# Patient Record
Sex: Female | Born: 1950 | Race: White | Hispanic: No | Marital: Married | Smoking: Former smoker
Health system: Southern US, Community
[De-identification: ages and names within clinical notes are randomized; demographics above are authoritative.]

## PROBLEM LIST (undated history)

## (undated) DIAGNOSIS — D693 Immune thrombocytopenic purpura: Secondary | ICD-10-CM

## (undated) DIAGNOSIS — D126 Benign neoplasm of colon, unspecified: Secondary | ICD-10-CM

## (undated) DIAGNOSIS — I471 Supraventricular tachycardia: Secondary | ICD-10-CM

## (undated) DIAGNOSIS — I10 Essential (primary) hypertension: Secondary | ICD-10-CM

## (undated) DIAGNOSIS — K759 Inflammatory liver disease, unspecified: Secondary | ICD-10-CM

## (undated) DIAGNOSIS — R002 Palpitations: Secondary | ICD-10-CM

## (undated) DIAGNOSIS — Z973 Presence of spectacles and contact lenses: Secondary | ICD-10-CM

## (undated) DIAGNOSIS — M1990 Unspecified osteoarthritis, unspecified site: Secondary | ICD-10-CM

## (undated) DIAGNOSIS — K219 Gastro-esophageal reflux disease without esophagitis: Secondary | ICD-10-CM

## (undated) DIAGNOSIS — K766 Portal hypertension: Secondary | ICD-10-CM

## (undated) DIAGNOSIS — K7460 Unspecified cirrhosis of liver: Secondary | ICD-10-CM

## (undated) DIAGNOSIS — I499 Cardiac arrhythmia, unspecified: Secondary | ICD-10-CM

## (undated) DIAGNOSIS — M545 Low back pain, unspecified: Secondary | ICD-10-CM

## (undated) DIAGNOSIS — Z889 Allergy status to unspecified drugs, medicaments and biological substances status: Secondary | ICD-10-CM

## (undated) DIAGNOSIS — K2270 Barrett's esophagus without dysplasia: Secondary | ICD-10-CM

## (undated) DIAGNOSIS — M19041 Primary osteoarthritis, right hand: Secondary | ICD-10-CM

## (undated) DIAGNOSIS — I480 Paroxysmal atrial fibrillation: Secondary | ICD-10-CM

## (undated) DIAGNOSIS — R7303 Prediabetes: Secondary | ICD-10-CM

## (undated) DIAGNOSIS — M26609 Unspecified temporomandibular joint disorder, unspecified side: Secondary | ICD-10-CM

## (undated) DIAGNOSIS — J189 Pneumonia, unspecified organism: Secondary | ICD-10-CM

## (undated) DIAGNOSIS — K5790 Diverticulosis of intestine, part unspecified, without perforation or abscess without bleeding: Secondary | ICD-10-CM

## (undated) DIAGNOSIS — R609 Edema, unspecified: Secondary | ICD-10-CM

## (undated) DIAGNOSIS — R600 Localized edema: Secondary | ICD-10-CM

## (undated) DIAGNOSIS — H903 Sensorineural hearing loss, bilateral: Secondary | ICD-10-CM

## (undated) DIAGNOSIS — M19042 Primary osteoarthritis, left hand: Secondary | ICD-10-CM

## (undated) DIAGNOSIS — I700 Atherosclerosis of aorta: Secondary | ICD-10-CM

## (undated) DIAGNOSIS — J45909 Unspecified asthma, uncomplicated: Secondary | ICD-10-CM

## (undated) DIAGNOSIS — I4719 Other supraventricular tachycardia: Secondary | ICD-10-CM

## (undated) DIAGNOSIS — Z8619 Personal history of other infectious and parasitic diseases: Secondary | ICD-10-CM

## (undated) HISTORY — PX: TMJ ARTHROPLASTY: SHX1066

## (undated) HISTORY — DX: Supraventricular tachycardia: I47.1

## (undated) HISTORY — DX: Other disorders of iron metabolism: E83.19

## (undated) HISTORY — PX: REFRACTIVE SURGERY: SHX103

## (undated) HISTORY — DX: Benign neoplasm of colon, unspecified: D12.6

## (undated) HISTORY — DX: Other supraventricular tachycardia: I47.19

## (undated) HISTORY — DX: Unspecified cirrhosis of liver: K74.60

## (undated) HISTORY — PX: TONSILLECTOMY AND ADENOIDECTOMY: SUR1326

## (undated) HISTORY — DX: Low back pain: M54.5

## (undated) HISTORY — PX: EYE SURGERY: SHX253

## (undated) HISTORY — DX: Low back pain, unspecified: M54.50

---

## 1977-12-08 HISTORY — PX: ABDOMINAL HYSTERECTOMY: SHX81

## 1995-12-09 HISTORY — PX: CARPAL TUNNEL RELEASE: SHX101

## 2000-10-08 ENCOUNTER — Encounter: Payer: Self-pay | Admitting: General Surgery

## 2000-10-08 ENCOUNTER — Encounter: Admission: RE | Admit: 2000-10-08 | Discharge: 2000-10-08 | Payer: Self-pay | Admitting: General Surgery

## 2000-10-08 ENCOUNTER — Other Ambulatory Visit: Admission: RE | Admit: 2000-10-08 | Discharge: 2000-10-08 | Payer: Self-pay | Admitting: General Surgery

## 2001-10-12 ENCOUNTER — Encounter: Payer: Self-pay | Admitting: General Surgery

## 2001-10-12 ENCOUNTER — Encounter: Admission: RE | Admit: 2001-10-12 | Discharge: 2001-10-12 | Payer: Self-pay | Admitting: General Surgery

## 2002-06-17 ENCOUNTER — Encounter: Admission: RE | Admit: 2002-06-17 | Discharge: 2002-06-17 | Payer: Self-pay | Admitting: General Surgery

## 2002-06-17 ENCOUNTER — Encounter: Payer: Self-pay | Admitting: General Surgery

## 2003-06-14 ENCOUNTER — Encounter: Admission: RE | Admit: 2003-06-14 | Discharge: 2003-06-14 | Payer: Self-pay | Admitting: Internal Medicine

## 2003-06-14 ENCOUNTER — Encounter: Payer: Self-pay | Admitting: Internal Medicine

## 2005-04-09 ENCOUNTER — Emergency Department: Payer: Self-pay | Admitting: Emergency Medicine

## 2005-04-29 ENCOUNTER — Ambulatory Visit: Payer: Self-pay | Admitting: Unknown Physician Specialty

## 2005-10-14 ENCOUNTER — Ambulatory Visit: Payer: Self-pay | Admitting: Internal Medicine

## 2006-12-17 ENCOUNTER — Ambulatory Visit: Payer: Self-pay | Admitting: Internal Medicine

## 2007-10-01 ENCOUNTER — Ambulatory Visit: Payer: Self-pay | Admitting: Gastroenterology

## 2007-12-08 ENCOUNTER — Ambulatory Visit: Payer: Self-pay | Admitting: Gastroenterology

## 2007-12-23 ENCOUNTER — Ambulatory Visit: Payer: Self-pay | Admitting: Gastroenterology

## 2008-01-07 ENCOUNTER — Ambulatory Visit: Payer: Self-pay | Admitting: Gastroenterology

## 2009-01-04 ENCOUNTER — Ambulatory Visit: Payer: Self-pay | Admitting: Internal Medicine

## 2010-01-29 ENCOUNTER — Ambulatory Visit: Payer: Self-pay | Admitting: Internal Medicine

## 2010-05-17 ENCOUNTER — Ambulatory Visit: Payer: Self-pay | Admitting: Podiatry

## 2010-08-14 ENCOUNTER — Ambulatory Visit: Payer: Self-pay | Admitting: Podiatry

## 2010-12-18 ENCOUNTER — Ambulatory Visit: Payer: Self-pay | Admitting: Internal Medicine

## 2011-08-12 ENCOUNTER — Ambulatory Visit: Payer: Self-pay

## 2012-02-05 ENCOUNTER — Ambulatory Visit: Payer: Self-pay | Admitting: Family Medicine

## 2012-04-12 ENCOUNTER — Ambulatory Visit: Payer: Self-pay | Admitting: Gastroenterology

## 2012-04-14 LAB — PATHOLOGY REPORT

## 2013-04-27 ENCOUNTER — Ambulatory Visit: Payer: Self-pay | Admitting: Family Medicine

## 2013-08-29 ENCOUNTER — Ambulatory Visit: Payer: Self-pay | Admitting: Orthopedic Surgery

## 2014-01-20 ENCOUNTER — Ambulatory Visit (INDEPENDENT_AMBULATORY_CARE_PROVIDER_SITE_OTHER): Payer: 59 | Admitting: Neurology

## 2014-01-20 ENCOUNTER — Encounter: Payer: Self-pay | Admitting: Neurology

## 2014-01-20 VITALS — BP 146/78 | HR 78 | Ht 66.0 in | Wt 178.0 lb

## 2014-01-20 DIAGNOSIS — M79609 Pain in unspecified limb: Secondary | ICD-10-CM

## 2014-01-20 DIAGNOSIS — R202 Paresthesia of skin: Secondary | ICD-10-CM

## 2014-01-20 DIAGNOSIS — M79602 Pain in left arm: Secondary | ICD-10-CM | POA: Insufficient documentation

## 2014-01-20 DIAGNOSIS — R209 Unspecified disturbances of skin sensation: Secondary | ICD-10-CM

## 2014-01-20 MED ORDER — GABAPENTIN 300 MG PO CAPS: 300.0000 mg | ORAL_CAPSULE | Freq: Every day | ORAL | Status: DC

## 2014-01-20 MED ORDER — DIAZEPAM 5 MG PO TABS: 5.0000 mg | ORAL_TABLET | ORAL | Status: DC | PRN

## 2014-01-20 NOTE — Progress Notes (Signed)
Note faxed.

## 2014-01-20 NOTE — Patient Instructions (Addendum)
1.  MRI left brachial plexus with contrast 2.  EMG of the left upper extremity on March 4th 3.  Start neurontin 300mg  at bedtime, if tolerating, start taking twice daily. 4.  Return to clinic 4-weeks  Your MRI has been scheduled at [facility] on February 19 at 5:30 pm.  Their office is located at [address].  Please go downstairs to [facility] to have labs drawn before leaving today.

## 2014-01-20 NOTE — Progress Notes (Signed)
[facility] Clinic Note - Initial Visit   Date: 01/20/2014    Traci Lynn MRN: [id number] DOB: 1951/10/14   Dear Dr Melrose Nakayama:  Thank you for your kind referral of Traci Lynn for consultation of left arm pain. Although her history is well known to you, please allow [place] to reiterate it for the purpose of our medical record. The patient was accompanied to the clinic by self.     History of Present Illness: Traci Lynn is a 63 y.o. right-handed Caucasian female with history of asthma, paroxsymal atrial tachycardia presenting for evaluation of left arm pain, numbness, and tingling.    Around the summer 2014, she gradually developed worsening left shoulder/arm pain and numbness/cold sensation over the left medial forearm.  Pain is over the left shoulder and posterior arm, described as stabbing.  Pain is intermittent and worse with shoulder flexion, external rotation, and abduction.  She is unable to lay on her left side due to pain.  There is no pain with movement elbow flexion/extension, wrist or hand movements.  She also has numbness over the left medial forearm and last two fingers. Denies any alleviating factors.  There is mild weakness, but she attributes this to pain.  She saw her PCP initially who tried pain medication and ant-inflammatory medication.  She was then sent to Dr. Joie Bimler, orthopeadics, who ordered MRI of cervical spine spine which showed mild to moderate foraminal narrowing at C5-6 and C-7 with no significant nerve encroachment.  In October, she saw Dr. Melrose Nakayama and underwent EMG which was non-diagnostic.  She was referred today for further evaluation.  Out-side paper records, electronic medical record, and images have been reviewed where available and summarized as:  MRI cervical spine 08/29/2013: Mild to moderate neuroforaminal narrowing on the right C3-4 level. Exiting nerve root compromise is a diagnostic consideration. The left neural  foramen is patent. At the C5-6 level, broad-based disc bulges appreciated demonstrating lateralization to the right and left. There is mild to moderate narrowing on the left and mild on the right. At the C6-7 level, there is mild neuroforaminal narrowing on the left which appears to be secondary to lateralization of the disc bulge.  Labs 04/11/2013 hemoglobin A1c 5.7, CBC within normal limits,  EMG of the left upper extremity performed at [facility] dated 10/05/2013:  Normal study.  Past Medical History  Diagnosis Date  . Paroxysmal atrial tachycardia   . Tubular adenoma of colon   . Lumbago     Past Surgical History  Procedure Laterality Date  . Carpal tunnel release    . Abdominal hysterectomy    . Tmj arthroplasty    . Tonsillectomy and adenoidectomy       Medications:  Current Outpatient Prescriptions on File Prior to Visit  Medication Sig Dispense Refill  . albuterol (PROVENTIL HFA;VENTOLIN HFA) 108 (90 BASE) MCG/ACT inhaler Inhale 1 puff into the lungs every 4 (four) hours as needed for wheezing or shortness of breath.      . Calcium Carbonate-Vitamin D (CALTRATE 600+D) 600-400 MG-UNIT per tablet Take 2 tablets by mouth daily.      . Cholecalciferol (VITAMIN D3) 2000 UNITS TABS Take 2 tablets by mouth daily.      Marland Kitchen EPINEPHrine (EPI-PEN) 0.3 mg/0.3 mL SOAJ injection Inject into the muscle as directed.      . fluticasone (FLONASE) 50 MCG/ACT nasal spray Place 2 sprays into both nostrils daily.      . Multiple Vitamins-Minerals (CENTRUM SILVER PO) Take 1 tablet  by mouth daily.      . vitamin C (ASCORBIC ACID) 250 MG tablet Take 250 mg by mouth daily.      . vitamin E 400 UNIT capsule Take 400 Units by mouth daily.       No current facility-administered medications on file prior to visit.    Allergies:  Allergies  Allergen Reactions  . Penicillin V Potassium Rash  . Shellfish Allergy     Family History: Family History  Problem Relation Age of Onset  . Kidney  disease Father     Deceased, 72  . Colon cancer Mother     Deceased 15  . Healthy Son     Social History: History   Social History  . Marital Status: Married    Spouse Name: N/A    Number of Children: N/A  . Years of Education: N/A   Occupational History  . Solicitor    Social History Main Topics  . Smoking status: Former Research scientist (life sciences)  . Smokeless tobacco: Never Used  . Alcohol Use: No  . Drug Use: No  . Sexual Activity: Not on file   Other Topics Concern  . Not on file   Social History Narrative   Lives with husband, married 7 years.  She has one son.   Retired from [employer].    Highest level of education:  4 years of college    Review of Systems:  CONSTITUTIONAL: No fevers, chills, night sweats, or weight loss.   EYES: No visual changes or eye pain ENT: No hearing changes.  No history of nose bleeds.   RESPIRATORY: No cough, wheezing and shortness of breath.   CARDIOVASCULAR: Negative for chest pain, and palpitations.   GI: Negative for abdominal discomfort, blood in stools or black stools.  No recent change in bowel habits.   GU:  No history of incontinence.   MUSCLOSKELETAL: + joint pain or swelling.  No myalgias.   SKIN: Negative for lesions, rash, and itching.   HEMATOLOGY/ONCOLOGY: Negative for prolonged bleeding, bruising easily, and swollen nodes.  No history of cancer.   ENDOCRINE: Negative for cold or heat intolerance, polydipsia or goiter.   PSYCH:  No depression or anxiety symptoms.   NEURO: As Above.   Vital Signs:  BP 146/78  Pulse 78  Ht 5\' 6"  (1.676 m)  Wt 178 lb (80.74 kg)  BMI 28.74 kg/m2  Neurological Exam: MENTAL STATUS including orientation to time, place, person, recent and remote memory, attention span and concentration, language, and fund of knowledge is normal.  Speech is not dysarthric.  CRANIAL NERVES: II:  No visual field defects.  Unremarkable fundi.   III-IV-VI: Pupils equal round and reactive to light.  Normal  conjugate, extra-ocular eye movements in all directions of gaze.  No nystagmus.  No ptosis.   V:  Normal facial sensation.   VII:  Normal facial symmetry and movements.    VIII:  Normal hearing and vestibular function.   IX-X:  Normal palatal movement.   XI:  Normal shoulder shrug and head rotation.   XII:  Normal tongue strength and range of motion, no deviation or fasciculation.  MOTOR:  Examination of the proximal left upper extremity is pain-limited, especially with deltoid, medial pectoralis, and infraspinatus testing.  No atrophy, fasciculations or abnormal movements.  No pronator drift.  Tone is normal.    Right Upper Extremity:    Left Upper Extremity:    Deltoid  5/5   Deltoid  5-/5   Biceps  5/5   Biceps  5/5   Triceps  5/5   Triceps  4+/5   Wrist extensors  5/5   Wrist extensors  5-/5   Wrist flexors  5/5   Wrist flexors  5-/5   Finger extensors  5/5   Finger extensors  5/5   Finger flexors 2/3 5/5   Finger flexors 2/3 5/5   Finger flexors 4/5 5/5  Finger flexors 4/5 4/5  ADM 5/5  ADM 4/5  Dorsal interossei  5/5   Dorsal interossei  4+/5   Abductor pollicis  5-/5   Abductor pollicis  5-/5   Tone (Ashworth scale)  0  Tone (Ashworth scale)  0   Right Lower Extremity:    Left Lower Extremity:    Hip flexors  5/5   Hip flexors  5/5   Hip extensors  5/5   Hip extensors  5/5   Knee flexors  5/5   Knee flexors  5/5   Knee extensors  5/5   Knee extensors  5/5   Dorsiflexors  5/5   Dorsiflexors  5/5   Plantarflexors  5/5   Plantarflexors  5/5   Toe extensors  5/5   Toe extensors  5/5   Toe flexors  5/5   Toe flexors  5/5   Tone (Ashworth scale)  0  Tone (Ashworth scale)  0   MSRs:  Right                                                                 Left brachioradialis 2+  brachioradialis 2+  biceps 2+  biceps 2+  triceps 2+  triceps 1+  patellar 2+  patellar 2+  ankle jerk 2+  ankle jerk 2+  Hoffman no  Hoffman no  plantar response down  plantar response down    SENSORY:  Reduced pin prick over the left lateral upper arm, medial forearm, and 5th digit.  Otherwise, normal and symmetric perception of light touch, pinprick, vibration, and proprioception.  Romberg's sign absent.   COORDINATION/GAIT: Normal finger-to- nose-finger and heel-to-shin.  Intact rapid alternating movements bilaterally.  Able to rise from a chair without using arms.  Gait narrow based and stable. Tandem and stressed gait intact.    IMPRESSION: Mrs. Angevine is a delightful 63 year-old female presenting for evaluation of left arm pain, paresthesias, and hand weakness. Neurological examination demonstrates weakness following C8 myotomes with sensory changes of the ulnar and median antebrachial cutaneous nerve distribution.  Additionally, her left triceps reflex is depressed compared to other reflexes.  Based on her history and exam, I would like to evaluate for a brachial plexus lesion and will repeat her NCS/EMG and obtain MRI of the brachial plexus.  Her history is not classic for neuralgic amyotrophy, but something to consider.   PLAN/RECOMMENDATIONS:  1.  MRI brachial plexus 2.  EMG of the left upper extremity 3.  Start neurontin 300mg  at bedtime, if tolerating, start taking twice daily.  Risks and benefits discussed. 4.  Return to clinic in 4 weeks  The duration of this appointment visit was 50 minutes of face-to-face time with the patient.  Greater than 50% of this time was spent in counseling, explanation of diagnosis, planning of further management, and coordination of care.   Thank  you for allowing me to participate in patient's care.  If I can answer any additional questions, I would be pleased to do so.    Sincerely,    Donika K. Posey Pronto, DO

## 2014-01-25 LAB — CREATINE: Creatinine, Ser: 0.4 mg/dL (ref <1.3)

## 2014-01-26 ENCOUNTER — Ambulatory Visit
Admission: RE | Admit: 2014-01-26 | Discharge: 2014-01-26 | Disposition: A | Discharge status: Home / Self Care | Payer: 59 | Source: Ambulatory Visit | Attending: Neurology | Admitting: Neurology

## 2014-01-26 DIAGNOSIS — R202 Paresthesia of skin: Secondary | ICD-10-CM

## 2014-01-26 DIAGNOSIS — M79602 Pain in left arm: Secondary | ICD-10-CM

## 2014-01-26 MED ORDER — GADOBENATE DIMEGLUMINE 529 MG/ML IV SOLN
15.0000 mL | Freq: Once | INTRAVENOUS | Status: AC | PRN
  Administered 2014-01-26: 15 mL via INTRAVENOUS

## 2014-01-30 ENCOUNTER — Encounter: Payer: Self-pay | Admitting: *Deleted

## 2014-02-08 ENCOUNTER — Ambulatory Visit (INDEPENDENT_AMBULATORY_CARE_PROVIDER_SITE_OTHER): Payer: 59 | Admitting: Neurology

## 2014-02-08 ENCOUNTER — Encounter: Payer: 59 | Admitting: Neurology

## 2014-02-08 ENCOUNTER — Encounter: Payer: Self-pay | Admitting: Neurology

## 2014-02-08 DIAGNOSIS — M5412 Radiculopathy, cervical region: Secondary | ICD-10-CM

## 2014-02-08 NOTE — Procedures (Signed)
[facility]  [address] [place]  [place], [place] [address] Tel: [phone] Fax:  [phone] Test Date:  02/08/2014  Patient: Traci Lynn DOB: 1951-02-25 Physician: Narda Amber, DO  Sex: Female Height: 5\' 6"  Ref Phys: Narda Amber  ID#: [id number] Temp: 35.2 Technician:    Patient Complaints: This is a 63 year-old female presenting with left arm pain, numbness, and tingling.  NCV & EMG Findings: Extensive electrodiagnostic evaluation of the left upper extremity reveals:  1. Normal median, ulnar, radial, lateral antebrachial cutaneous, and medial antebrachial cutaneous sensory nerve responses. 2. Normal median and ulnar motor responses. 3. Needle electrode examination shows very mild chronic motor axon loss changes affecting the first dorsal interosseous and triceps muscles only.  Impression: 1. 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 2. There is no evidence of a left brachial plexopathy.   ___________________________ Narda Amber, DO    Nerve Conduction Studies Anti Sensory Summary Table   Site NR Peak (ms) Norm Peak (ms) P-T Amp (V) Norm P-T Amp  Left Lat Ante Brach Cutan Anti Sensory (Lat Forearm)  Lat Biceps    2.4 <2.8 16.2 >10  Left Med Ante Brach Cutan Anti Sensory (Med Forearm)  Elbow    2.9  10.2   Left Median Anti Sensory (2nd Digit)  Wrist    2.8 <3.8 34.8 >10  Left Radial Anti Sensory (Base 1st Digit)  Wrist    2.1 <2.8 38.4 >10  Left Ulnar Anti Sensory (5th Digit)  Wrist    2.6 <3.2 34.0 >5   Motor Summary Table   Site NR Onset (ms) Norm Onset (ms) O-P Amp (mV) Norm O-P Amp Site1 Site2 Delta-0 (ms) Dist (cm) Vel (m/s) Norm Vel (m/s)  Left Median Motor (Abd Poll Brev)  Wrist    2.6 <4.0 9.7 >5 Elbow Wrist 4.5 27.0 60 >50  Elbow    7.1  9.6         Left Ulnar Motor (Abd Dig Minimi)  Wrist    2.0 <3.1 8.8 >7 B Elbow Wrist 3.7 23.0 62 >50  B Elbow    5.7  8.5  A  Elbow B Elbow 2.0 10.0 50 >50  A Elbow    7.7  8.5          EMG   Side Muscle Ins Act Fibs Psw Fasc Number Recrt Dur Dur. Amp Amp. Poly Poly. Comment  Left 1stDorInt Nml Nml Nml Nml 1- Mod-R Few 1+ Few 1+ Nml Nml N/A  Left FlexPolLong Nml Nml Nml Nml Nml Nml Nml Nml Nml Nml Nml Nml N/A  Left Ext Indicis Nml Nml Nml Nml Nml Nml Nml Nml Nml Nml Nml Nml N/A  Left PronatorTeres Nml Nml Nml Nml Nml Nml Nml Nml Nml Nml Nml Nml N/A  Left ABD Dig Min Nml Nml Nml Nml Nml Nml Nml Nml Nml Nml Nml Nml N/A  Left Biceps Nml Nml Nml Nml Nml Nml Nml Nml Nml Nml Nml Nml N/A  Left Triceps Nml Nml Nml Nml 1- Mod-R Few 1+ Nml Nml Nml Nml N/A  Left Deltoid Nml Nml Nml Nml Nml Nml Nml Nml Nml Nml Nml Nml N/A  Left Infraspinatus Nml Nml Nml Nml Nml Nml Nml Nml Nml Nml Nml Nml N/A  Left Supraspinatus Nml Nml Nml Nml Nml Nml Nml Nml Nml Nml Nml Nml N/A  Left Cervical Parasp Low Nml Nml Nml Nml NE - - - - - - -  N/A      Waveforms:

## 2014-02-08 NOTE — Progress Notes (Signed)
See procedure note for EMG results.  Donika K. Patel, DO  

## 2014-02-09 ENCOUNTER — Encounter: Payer: Self-pay | Admitting: *Deleted

## 2014-02-09 ENCOUNTER — Other Ambulatory Visit: Payer: Self-pay | Admitting: *Deleted

## 2014-02-09 DIAGNOSIS — M79602 Pain in left arm: Secondary | ICD-10-CM

## 2014-02-09 NOTE — Progress Notes (Signed)
Notes faxed.  Pt notified of appt with Dr. Joie Bimler on March 9 at 2:00.   She wants to know if she needs to keep her appt with you on March 13.

## 2014-02-13 ENCOUNTER — Telehealth: Payer: Self-pay | Admitting: Neurology

## 2014-02-13 NOTE — Telephone Encounter (Signed)
Pt would like rx for 45 of the gabapentin [facility]

## 2014-02-14 ENCOUNTER — Other Ambulatory Visit: Payer: Self-pay | Admitting: *Deleted

## 2014-02-14 NOTE — Telephone Encounter (Signed)
Refill for 6 months.  Thanks.

## 2014-02-14 NOTE — Telephone Encounter (Signed)
LM informing pt that I called in rx to [facility] in [place].

## 2014-02-14 NOTE — Telephone Encounter (Signed)
How many refills? 

## 2014-02-15 ENCOUNTER — Telehealth: Payer: Self-pay | Admitting: Neurology

## 2014-02-15 NOTE — Telephone Encounter (Signed)
Pt called to cancel follow up scheduled for 02/17/14. She says she was referred to Ortho for follow up / [name]

## 2014-02-16 NOTE — Telephone Encounter (Signed)
Noted.  Traci Lynn K. Tyrihanna Wingert, DO   

## 2014-02-17 ENCOUNTER — Ambulatory Visit: Payer: 59 | Admitting: Neurology

## 2015-01-05 ENCOUNTER — Other Ambulatory Visit: Payer: Self-pay | Admitting: Neurology

## 2015-01-05 NOTE — Telephone Encounter (Signed)
Rx sent 

## 2015-01-16 ENCOUNTER — Other Ambulatory Visit: Payer: Self-pay | Admitting: *Deleted

## 2015-01-16 NOTE — Telephone Encounter (Signed)
Patient requesting a refill on this med. Patient has not been seen in a year

## 2015-01-17 ENCOUNTER — Other Ambulatory Visit: Payer: Self-pay | Admitting: *Deleted

## 2015-01-17 MED ORDER — GABAPENTIN 300 MG PO CAPS: 300.0000 mg | ORAL_CAPSULE | Freq: Every day | ORAL | Status: DC

## 2015-01-17 NOTE — Telephone Encounter (Signed)
Rx sent 

## 2015-03-09 ENCOUNTER — Ambulatory Visit
Admit: 2015-03-09 | Disposition: A | Discharge status: Home / Self Care | Payer: Self-pay | Attending: Family Medicine | Admitting: Family Medicine

## 2015-08-20 ENCOUNTER — Ambulatory Visit
Admission: EM | Admit: 2015-08-20 | Discharge: 2015-08-20 | Disposition: A | Discharge status: Home / Self Care | Payer: 59 | Attending: Family Medicine | Admitting: Family Medicine

## 2015-08-20 ENCOUNTER — Other Ambulatory Visit: Payer: Self-pay

## 2015-08-20 ENCOUNTER — Encounter: Payer: Self-pay | Admitting: Emergency Medicine

## 2015-08-20 DIAGNOSIS — R1013 Epigastric pain: Secondary | ICD-10-CM | POA: Insufficient documentation

## 2015-08-20 DIAGNOSIS — K529 Noninfective gastroenteritis and colitis, unspecified: Secondary | ICD-10-CM | POA: Diagnosis not present

## 2015-08-20 DIAGNOSIS — R079 Chest pain, unspecified: Secondary | ICD-10-CM | POA: Diagnosis present

## 2015-08-20 DIAGNOSIS — R109 Unspecified abdominal pain: Secondary | ICD-10-CM | POA: Diagnosis present

## 2015-08-20 LAB — CBC WITH DIFFERENTIAL/PLATELET
BASOS ABS: 0.1 10*3/uL (ref 0–0.1)
BASOS PCT: 1 %
EOS ABS: 0.3 10*3/uL (ref 0–0.7)
EOS PCT: 4 %
HCT: 36.9 % (ref 35.0–47.0)
Hemoglobin: 12.7 g/dL (ref 12.0–16.0)
Lymphocytes Relative: 38 %
Lymphs Abs: 2.5 10*3/uL (ref 1.0–3.6)
MCH: 31.6 pg (ref 26.0–34.0)
MCHC: 34.4 g/dL (ref 32.0–36.0)
MCV: 91.8 fL (ref 80.0–100.0)
MONO ABS: 0.5 10*3/uL (ref 0.2–0.9)
MONOS PCT: 8 %
NEUTROS ABS: 3.2 10*3/uL (ref 1.4–6.5)
Neutrophils Relative %: 49 %
PLATELETS: 144 10*3/uL — AB (ref 150–440)
RBC: 4.02 MIL/uL (ref 3.80–5.20)
RDW: 14.4 % (ref 11.5–14.5)
WBC: 6.6 10*3/uL (ref 3.6–11.0)

## 2015-08-20 LAB — COMPREHENSIVE METABOLIC PANEL
ALBUMIN: 4.2 g/dL (ref 3.5–5.0)
ALK PHOS: 122 U/L (ref 38–126)
ALT: 35 U/L (ref 14–54)
ANION GAP: 8 (ref 5–15)
AST: 46 U/L — ABNORMAL HIGH (ref 15–41)
BILIRUBIN TOTAL: 0.3 mg/dL (ref 0.3–1.2)
BUN: 9 mg/dL (ref 6–20)
CALCIUM: 9.7 mg/dL (ref 8.9–10.3)
CO2: 30 mmol/L (ref 22–32)
CREATININE: 0.82 mg/dL (ref 0.44–1.00)
Chloride: 103 mmol/L (ref 101–111)
GFR calc Af Amer: >60 mL/min (ref >60)
GFR calc non Af Amer: >60 mL/min (ref >60)
GLUCOSE: 115 mg/dL — AB (ref 65–99)
Potassium: 4.2 mmol/L (ref 3.5–5.1)
Sodium: 141 mmol/L (ref 135–145)
TOTAL PROTEIN: 7.1 g/dL (ref 6.5–8.1)

## 2015-08-20 LAB — LIPASE, BLOOD: LIPASE: 36 U/L (ref 22–51)

## 2015-08-20 MED ORDER — ONDANSETRON 8 MG PO TBDP
8.0000 mg | ORAL_TABLET | Freq: Once | ORAL | Status: AC
  Administered 2015-08-20: 8 mg via ORAL

## 2015-08-20 MED ORDER — ONDANSETRON 8 MG PO TBDP: 8.0000 mg | ORAL_TABLET | Freq: Three times a day (TID) | ORAL | Status: DC | PRN

## 2015-08-20 MED ORDER — GI COCKTAIL ~~LOC~~
30.0000 mL | Freq: Once | ORAL | Status: AC
  Administered 2015-08-20: 30 mL via ORAL

## 2015-08-20 NOTE — ED Provider Notes (Addendum)
CSN: [id number]     Arrival date & time 08/20/15  1603 History   First MD Initiated Contact with Patient 08/20/15 1651     Chief Complaint  Patient presents with  . Abdominal Pain  . Chest Pain   (Consider location/radiation/quality/duration/timing/severity/associated sxs/prior Treatment) HPI Comments: 64 yo female with a 3 days h/o stomach pains, nausea and watery diarrhea. Denies any fevers, chills, melena, hematochezia, shortness of breath. Pain is mainly epigastric. Patient states has a prior h/o stomach ulcers.   Patient is a 64 y.o. female presenting with abdominal pain and chest pain. The history is provided by the patient.  Abdominal Pain Associated symptoms: chest pain   Chest Pain Associated symptoms: abdominal pain     Past Medical History  Diagnosis Date  . Paroxysmal atrial tachycardia   . Tubular adenoma of colon   . Lumbago    Past Surgical History  Procedure Laterality Date  . Carpal tunnel release    . Abdominal hysterectomy    . Tmj arthroplasty    . Tonsillectomy and adenoidectomy     Family History  Problem Relation Age of Onset  . Kidney disease Father     Deceased, 55  . Colon cancer Mother     Deceased 72  . Healthy Son    Social History  Substance Use Topics  . Smoking status: Former Research scientist (life sciences)  . Smokeless tobacco: Never Used  . Alcohol Use: No   OB History    No data available     Review of Systems  Cardiovascular: Positive for chest pain.  Gastrointestinal: Positive for abdominal pain.    Allergies  Penicillin v potassium and Shellfish allergy  Home Medications   Prior to Admission medications   Medication Sig Start Date End Date Taking? Authorizing Provider  metoprolol succinate (TOPROL-XL) 25 MG 24 hr tablet Take 25 mg by mouth daily.   Yes Historical Provider, MD  albuterol (PROVENTIL HFA;VENTOLIN HFA) 108 (90 BASE) MCG/ACT inhaler Inhale 1 puff into the lungs every 4 (four) hours as needed for wheezing or shortness of breath.     Historical Provider, MD  Calcium Carbonate-Vitamin D (CALTRATE 600+D) 600-400 MG-UNIT per tablet Take 2 tablets by mouth daily.    Historical Provider, MD  Cholecalciferol (VITAMIN D3) 2000 UNITS TABS Take 2 tablets by mouth daily.    Historical Provider, MD  diazepam (VALIUM) 5 MG tablet Take 1 tablet (5 mg total) by mouth as needed for anxiety (Take 30-min prior to MRI). 01/20/14   Donika Keith Rake, DO  diclofenac (CATAFLAM) 50 MG tablet Take 50 mg by mouth 2 (two) times daily.    Historical Provider, MD  EPINEPHrine (EPI-PEN) 0.3 mg/0.3 mL SOAJ injection Inject into the muscle as directed.    Historical Provider, MD  fluticasone (FLONASE) 50 MCG/ACT nasal spray Place 2 sprays into both nostrils daily.    Historical Provider, MD  gabapentin (NEURONTIN) 300 MG capsule Take 1 capsule (300 mg total) by mouth at bedtime. 01/17/15   Donika Keith Rake, DO  Multiple Vitamins-Minerals (CENTRUM SILVER PO) Take 1 tablet by mouth daily.    Historical Provider, MD  ondansetron (ZOFRAN ODT) 8 MG disintegrating tablet Take 1 tablet (8 mg total) by mouth every 8 (eight) hours as needed for nausea or vomiting. 08/20/15   Norval Gable, MD  vitamin C (ASCORBIC ACID) 250 MG tablet Take 250 mg by mouth daily.    Historical Provider, MD  vitamin E 400 UNIT capsule Take 400 Units by mouth daily.  Historical Provider, MD   Meds Ordered and Administered this Visit   Medications  gi cocktail (Maalox,Lidocaine,Donnatal) (30 mLs Oral Given 08/20/15 1744)  ondansetron (ZOFRAN-ODT) disintegrating tablet 8 mg (8 mg Oral Given 08/20/15 1738)    BP 154/86 mmHg  Pulse 86  Temp(Src) 98.4 F (36.9 C) (Tympanic)  Resp 16  Ht 5\' 6"  (1.676 m)  Wt 170 lb (77.111 kg)  BMI 27.45 kg/m2  SpO2 99% No data found.   Physical Exam  Constitutional: She appears well-developed and well-nourished. No distress.  HENT:  Mouth/Throat: Mucous membranes are normal.  Eyes: Pupils are equal, round, and reactive to light.  Neck: Neck supple.   Cardiovascular: Normal rate, regular rhythm, normal heart sounds and intact distal pulses.   No murmur heard. Pulmonary/Chest: Effort normal and breath sounds normal. No respiratory distress. She has no wheezes. She has no rales. She exhibits no tenderness.  Abdominal: Soft. Bowel sounds are normal. She exhibits no distension and no mass. There is tenderness (mild diffuse tenderness to palpation, worse over the epigastric and right upper quadrant; negative Murphey's sign; no rebound or guarding). There is no rebound and no guarding.  Neurological: She is alert.  Skin: Skin is warm and dry. No rash noted. She is not diaphoretic. No erythema. No pallor.  Vitals reviewed.   ED Course  Procedures (including critical care time)  Labs Review Labs Reviewed  COMPREHENSIVE METABOLIC PANEL - Abnormal; Notable for the following:    Glucose, Bld 115 (*)    AST 46 (*)    All other components within normal limits  CBC WITH DIFFERENTIAL/PLATELET - Abnormal; Notable for the following:    Platelets 144 (*)    All other components within normal limits  LIPASE, BLOOD    Imaging Review No results found.   Visual Acuity Review  Right Eye Distance:   Left Eye Distance:   Bilateral Distance:    Right Eye Near:   Left Eye Near:    Bilateral Near:      EKG: normal EKG, normal sinus rhythm, there are no previous tracings available for comparison; reviewed by me and agree with readout.   MDM   1. Gastroenteritis   2. Dyspepsia    Discharge Medication List as of 08/20/2015  6:18 PM    START taking these medications   Details  ondansetron (ZOFRAN ODT) 8 MG disintegrating tablet Take 1 tablet (8 mg total) by mouth every 8 (eight) hours as needed for nausea or vomiting., Starting 08/20/2015, Until Discontinued, Normal      Plan: 1. Test results and diagnosis reviewed with patient 2. rx as per orders; risks, benefits, potential side effects reviewed with patient 3. Recommend supportive  treatment with clear liquid diet, then advance slowly as tolerated; otc imodium, omeprazole 4. Patient given zofran and gi cocktail with significant improvement/almost complete resolution of symptoms  5. F/u prn if symptoms worsen or don't improve    Norval Gable, MD 08/20/15 Talbot Grumbling, MD 08/20/15 [place], MD 08/20/15 2206

## 2015-08-20 NOTE — ED Notes (Signed)
Patient c/o epigastric pain for the past 3 days.  Patient reports nausea.

## 2015-10-12 ENCOUNTER — Ambulatory Visit
Admission: EM | Admit: 2015-10-12 | Discharge: 2015-10-12 | Disposition: A | Discharge status: Home / Self Care | Payer: 59 | Attending: Family Medicine | Admitting: Family Medicine

## 2015-10-12 DIAGNOSIS — K529 Noninfective gastroenteritis and colitis, unspecified: Secondary | ICD-10-CM

## 2015-10-12 DIAGNOSIS — J029 Acute pharyngitis, unspecified: Secondary | ICD-10-CM

## 2015-10-12 DIAGNOSIS — H6593 Unspecified nonsuppurative otitis media, bilateral: Secondary | ICD-10-CM | POA: Diagnosis not present

## 2015-10-12 DIAGNOSIS — J321 Chronic frontal sinusitis: Secondary | ICD-10-CM

## 2015-10-12 DIAGNOSIS — J209 Acute bronchitis, unspecified: Secondary | ICD-10-CM

## 2015-10-12 MED ORDER — CEFDINIR 300 MG PO CAPS: 300.0000 mg | ORAL_CAPSULE | Freq: Two times a day (BID) | ORAL | Status: DC

## 2015-10-12 MED ORDER — FLUTICASONE PROPIONATE 50 MCG/ACT NA SUSP: 1.0000 | Freq: Two times a day (BID) | NASAL | Status: DC

## 2015-10-12 MED ORDER — PREDNISONE 20 MG PO TABS
40.0000 mg | ORAL_TABLET | Freq: Every day | ORAL | Status: DC
Start: 2015-10-12 — End: 2017-04-08

## 2015-10-12 MED ORDER — ACETAMINOPHEN 500 MG PO TABS: 500.0000 mg | ORAL_TABLET | Freq: Four times a day (QID) | ORAL | Status: DC | PRN

## 2015-10-12 MED ORDER — SALINE SPRAY 0.65 % NA SOLN: 2.0000 | NASAL | Status: DC

## 2015-10-12 MED ORDER — BENZONATATE 200 MG PO CAPS: 200.0000 mg | ORAL_CAPSULE | Freq: Three times a day (TID) | ORAL | Status: DC | PRN

## 2015-10-12 NOTE — ED Notes (Signed)
Started 3 weeks ago with sinus congestion and post nasal drip. Now with + cough, worse at night

## 2015-10-12 NOTE — ED Provider Notes (Signed)
CSN: [id number]     Arrival date & time 10/12/15  3007 History   First MD Initiated Contact with Patient 10/12/15 1102     Chief Complaint  Patient presents with  . URI   (Consider location/radiation/quality/duration/timing/severity/associated sxs/prior Treatment) HPI Comments: Married caucasian female here with spouse for evaluation of sore throat, nasal congestion.  Has tried salt water gargle, throat lozenges, flonase, zyrtec.  Dry cough and water soft some green diarrhea yesterday resolved.  Has had on/off frontal headache x 3 1/2 weeks with post nasal drip.  Flare in allergies during the fall  The history is provided by the patient and the spouse.    Past Medical History  Diagnosis Date  . Paroxysmal atrial tachycardia ([place])   . Tubular adenoma of colon   . Lumbago    Past Surgical History  Procedure Laterality Date  . Carpal tunnel release    . Abdominal hysterectomy    . Tmj arthroplasty    . Tonsillectomy and adenoidectomy     Family History  Problem Relation Age of Onset  . Kidney disease Father     Deceased, 82  . Colon cancer Mother     Deceased 65  . Healthy Son    Social History  Substance Use Topics  . Smoking status: Former Research scientist (life sciences)  . Smokeless tobacco: Never Used  . Alcohol Use: No   OB History    No data available     Review of Systems  Constitutional: Negative for fever, chills, diaphoresis, activity change, appetite change, fatigue and unexpected weight change.  HENT: Positive for congestion, postnasal drip and sinus pressure. Negative for dental problem, drooling, ear discharge, ear pain, facial swelling, hearing loss, mouth sores, nosebleeds, rhinorrhea, sneezing, sore throat, tinnitus, trouble swallowing and voice change.   Eyes: Negative for photophobia, pain, discharge, redness, itching and visual disturbance.  Respiratory: Positive for cough. Negative for choking, chest tightness, shortness of breath, wheezing and stridor.   Cardiovascular:  Negative for chest pain and leg swelling.  Gastrointestinal: Positive for diarrhea. Negative for nausea, vomiting, abdominal pain, constipation, blood in stool, abdominal distention and anal bleeding.  Endocrine: Negative for cold intolerance and heat intolerance.  Genitourinary: Negative for dysuria, hematuria and difficulty urinating.  Musculoskeletal: Negative for myalgias, back pain, joint swelling, arthralgias, gait problem, neck pain and neck stiffness.  Skin: Negative for rash.  Allergic/Immunologic: Positive for environmental allergies. Negative for food allergies.  Neurological: Positive for headaches. Negative for dizziness, tremors, seizures, syncope, facial asymmetry, speech difficulty, weakness, light-headedness and numbness.  Hematological: Negative for adenopathy. Does not bruise/bleed easily.  Psychiatric/Behavioral: Negative for behavioral problems, confusion and agitation. The patient is not nervous/anxious.     Allergies  Penicillin v potassium and Shellfish allergy  Home Medications   Prior to Admission medications   Medication Sig Start Date End Date Taking? Authorizing Provider  albuterol (PROVENTIL HFA;VENTOLIN HFA) 108 (90 BASE) MCG/ACT inhaler Inhale 1 puff into the lungs every 4 (four) hours as needed for wheezing or shortness of breath.   Yes Historical Provider, MD  Calcium Carbonate-Vitamin D (CALTRATE 600+D) 600-400 MG-UNIT per tablet Take 2 tablets by mouth daily.   Yes Historical Provider, MD  Cholecalciferol (VITAMIN D3) 2000 UNITS TABS Take 2 tablets by mouth daily.   Yes Historical Provider, MD  EPINEPHrine (EPI-PEN) 0.3 mg/0.3 mL SOAJ injection Inject into the muscle as directed.   Yes Historical Provider, MD  ferrous sulfate 325 (65 FE) MG tablet Take 325 mg by mouth daily with breakfast.  Yes Historical Provider, MD  fluticasone (FLONASE) 50 MCG/ACT nasal spray Place 2 sprays into both nostrils daily.   Yes Historical Provider, MD  meloxicam (MOBIC) 7.5  MG tablet Take 7.5 mg by mouth daily.   Yes Historical Provider, MD  metoprolol succinate (TOPROL-XL) 25 MG 24 hr tablet Take 25 mg by mouth daily.   Yes Historical Provider, MD  Multiple Vitamins-Minerals (CENTRUM SILVER PO) Take 1 tablet by mouth daily.   Yes Historical Provider, MD  vitamin C (ASCORBIC ACID) 250 MG tablet Take 250 mg by mouth daily.   Yes Historical Provider, MD  vitamin E 400 UNIT capsule Take 400 Units by mouth daily.   Yes Historical Provider, MD  acetaminophen (TYLENOL) 500 MG tablet Take 1 tablet (500 mg total) by mouth every 6 (six) hours as needed for mild pain, moderate pain, fever or headache. 10/12/15   Olen Cordial, NP  benzonatate (TESSALON) 200 MG capsule Take 1 capsule (200 mg total) by mouth 3 (three) times daily as needed for cough. 10/12/15   Olen Cordial, NP  cefdinir (OMNICEF) 300 MG capsule Take 1 capsule (300 mg total) by mouth 2 (two) times daily. 10/12/15   Olen Cordial, NP  fluticasone (FLONASE) 50 MCG/ACT nasal spray Place 1 spray into both nostrils 2 (two) times daily. 10/12/15   Olen Cordial, NP  predniSONE (DELTASONE) 20 MG tablet Take 2 tablets (40 mg total) by mouth daily with breakfast. 10/12/15   Olen Cordial, NP  sodium chloride (OCEAN) 0.65 % SOLN nasal spray Place 2 sprays into both nostrils every 2 (two) hours while awake. 10/12/15   Olen Cordial, NP   Meds Ordered and Administered this Visit  Medications - No data to display  BP 126/107 mmHg  Pulse 82  Temp(Src) 98.7 F (37.1 C) (Tympanic)  Resp 20  Ht 5\' 6"  (1.676 m)  Wt 176 lb (79.833 kg)  BMI 28.42 kg/m2  SpO2 99% No data found.   Physical Exam  Constitutional: She is oriented to person, place, and time. She appears well-developed and well-nourished. She is active and cooperative.  Non-toxic appearance. She does not have a sickly appearance. She appears ill. No distress.  HENT:  Head: Normocephalic and atraumatic.  Right Ear: Hearing, external ear and  ear canal normal. A middle ear effusion is present.  Left Ear: Hearing, external ear and ear canal normal. A middle ear effusion is present.  Nose: Mucosal edema and rhinorrhea present. No nose lacerations, sinus tenderness, nasal deformity, septal deviation or nasal septal hematoma. No epistaxis.  No foreign bodies. Right sinus exhibits frontal sinus tenderness. Right sinus exhibits no maxillary sinus tenderness. Left sinus exhibits frontal sinus tenderness. Left sinus exhibits no maxillary sinus tenderness.  Mouth/Throat: Uvula is midline and mucous membranes are normal. Mucous membranes are not pale, not dry and not cyanotic. She does not have dentures. No oral lesions. No trismus in the jaw. Normal dentition. No dental abscesses, uvula swelling, lacerations or dental caries. Posterior oropharyngeal edema and posterior oropharyngeal erythema present. No oropharyngeal exudate or tonsillar abscesses.  Cobblestoning posterior pharynx; bilateral TMs with air fluid level; nasal turbinates with edema/erythema  Eyes: Conjunctivae, EOM and lids are normal. Pupils are equal, round, and reactive to light. Right eye exhibits no chemosis, no discharge, no exudate and no hordeolum. No foreign body present in the right eye. Left eye exhibits no chemosis, no discharge, no exudate and no hordeolum. No foreign body present in the left eye. Right conjunctiva  is not injected. Right conjunctiva has no hemorrhage. Left conjunctiva is not injected. Left conjunctiva has no hemorrhage. No scleral icterus. Right eye exhibits normal extraocular motion and no nystagmus. Left eye exhibits normal extraocular motion and no nystagmus. Right pupil is round and reactive. Left pupil is round and reactive. Pupils are equal.  Neck: Trachea normal and normal range of motion. Neck supple. No tracheal tenderness, no spinous process tenderness and no muscular tenderness present. No rigidity. No tracheal deviation, no edema, no erythema and normal  range of motion present. No thyroid mass and no thyromegaly present.  Cardiovascular: Normal rate, regular rhythm, S1 normal, S2 normal, normal heart sounds and intact distal pulses.  PMI is not displaced.  Exam reveals no gallop and no friction rub.   No murmur heard. Pulmonary/Chest: Breath sounds normal. No accessory muscle usage or stridor. No respiratory distress. She has no decreased breath sounds. She has no wheezes. She has no rhonchi. She has no rales. She exhibits no tenderness.  Negative egophany all fields; rare nonproductive cough during exam  Abdominal: Soft. Bowel sounds are normal. She exhibits no shifting dullness, no distension, no pulsatile liver, no fluid wave, no abdominal bruit, no ascites, no pulsatile midline mass and no mass. There is no hepatosplenomegaly. There is no tenderness. There is no rigidity, no rebound, no guarding, no tenderness at McBurney's point and negative Murphy's sign. Hernia confirmed negative in the ventral area.  Dull to percussion x 4 quads  Musculoskeletal: Normal range of motion. She exhibits no edema or tenderness.       Right shoulder: Normal.       Left shoulder: Normal.       Right elbow: Normal.      Left elbow: Normal.       Right hip: Normal.       Left hip: Normal.       Right knee: Normal.       Left knee: Normal.       Cervical back: Normal.       Thoracic back: Normal.       Lumbar back: Normal.       Right hand: Normal.       Left hand: Normal.  Lymphadenopathy:       Head (right side): No submental, no submandibular, no tonsillar, no preauricular, no posterior auricular and no occipital adenopathy present.       Head (left side): No submental, no submandibular, no tonsillar, no preauricular, no posterior auricular and no occipital adenopathy present.    She has no cervical adenopathy.       Right cervical: No superficial cervical, no deep cervical and no posterior cervical adenopathy present.      Left cervical: No superficial  cervical, no deep cervical and no posterior cervical adenopathy present.  Neurological: She is alert and oriented to person, place, and time. She has normal strength. She is not disoriented. She displays no atrophy and no tremor. No cranial nerve deficit or sensory deficit. She exhibits normal muscle tone. She displays no seizure activity. Coordination and gait normal. GCS eye subscore is 4. GCS verbal subscore is 5. GCS motor subscore is 6.  Skin: Skin is warm, dry and intact. No abrasion, no bruising, no burn, no ecchymosis, no laceration, no lesion, no petechiae and no rash noted. She is not diaphoretic. No cyanosis or erythema. No pallor. Nails show no clubbing.  Psychiatric: She has a normal mood and affect. Her speech is normal and behavior is normal.  Judgment and thought content normal. Cognition and memory are normal.  Nursing note and vitals reviewed.   ED Course  Procedures (including critical care time)  Labs Review Labs Reviewed - No data to display  Imaging Review No results found.   MDM   1. Acute bronchitis, unspecified organism   2. Otitis media with effusion, bilateral   3. Chronic frontal sinusitis   4. Acute pharyngitis, unspecified pharyngitis type   5. Gastroenteritis, acute    Increase albuterol use to BID and prn up to every 4 hours 1-2 puffs.  Has at home using maybe once a day right now.  Tessalon pearles 200mg  po TIID prn cough.  Prednisone 40mg  po daily x 5 days as history asthma  Given Rx for cefdinir 300mg  po BID x 10 days if no improvement in sinus/cough symptoms with prescribed therapy x 48 hours.Bronchitis simple, community acquired, may have started as viral (probably respiratory syncytial, parainfluenza, influenza, or adenovirus), but now evidence of acute purulent bronchitis with resultant bronchial edema and mucus formation.  Viruses are the most common cause of bronchial inflammation in otherwise healthy adults with acute bronchitis.  The appearance of  sputum is not predictive of whether a bacterial infection is present.  Purulent sputum is most often caused by viral infections.  There are a small portion of those caused by non-viral agents being Mycoplamsa pneumonia.  Microscopic examination or C&S of sputum in the healthy adult with acute bronchitis is generally not helpful (usually negative or normal respiratory flora) other considerations being cough from upper respiratory tract infections, sinusitis or allergic syndromes (mild asthma or viral pneumonia).  Differential Diagnosis:  reactive airway disease (asthma, allergic aspergillosis (eosinophilia), chronic bronchitis, respiratory infection (Sinusitis, Common cold, pneumonia), congestive heart failure, reflux esophagitis, bronchogenic tumor, aspiration syndromes and/or exposure irritants/tobacco smoke.  In this case, there is no evidence of any invasive bacterial illness.  Most likely viral etiology so will hold on antibiotic treatment.  Advise supportive care with rest, encourage fluids, good hygiene and watch for any worsening symptoms.  If they were to develop:  come back to the office or go to the emergency room if after hours. Without high fever, severe dyspnea, lack of physical findings or other risk factors, I will hold on a chest radiograph and CBC at this time. I discussed that approximately 50% of patients with acute bronchitis have a cough that lasts up to three weeks, and 25% for over a month.  Tylenol, one to two tablets every four hours as needed for fever or myalgias.   No aspirin.  Patient instructed to follow up in one week or sooner if symptoms worsen. Patient verbalized agreement and understanding of treatment plan.  P2:  hand washing and cover cough  Supportive treatment.   No evidence of invasive bacterial infection, non toxic and well hydrated.  This is most likely self limiting viral infection.  I do not see where any further testing or imaging is necessary at this time.   I will  suggest supportive care, rest, good hygiene and encourage the patient to take adequate fluids.  The patient is to return to clinic or EMERGENCY ROOM if symptoms worsen or change significantly e.g. ear pain, fever, purulent discharge from ears or bleeding.  Exitcare handout on otitis media with effusion given to patient.  Patient verbalized agreement and understanding of treatment plan.    Increase flonase to twice a day use.  Add saline 2 sprays each nostril at least BID up to q2h  while awake with congestion.  If no improvement within 48 hours for headache/congestion/drip start cefdinir 300mg  po BID x 10 days.  Discussed with patient less than 5% chance cephalosporin allergy if penicillin allergy and to stop medication if she develops rash and contact us.  No evidence of systemic bacterial infection, non toxic and well hydrated.  I do not see where any further testing or imaging is necessary at this time.   I will suggest supportive care, rest, good hygiene and encourage the patient to take adequate fluids.  The patient is to return to clinic or EMERGENCY ROOM if symptoms worsen or change significantly.  Exitcare handout on sinusitis given to patient.  Patient verbalized agreement and understanding of treatment plan and had no further questions at this time.   P2:  Hand washing and cover cough  Probable post nasal drip.  Medications as discussed may also try salt water gargles and honey with lemon.  Tylenol 1000mg  po QID prn.  Zyrtec 10mg  po daily at home.  If worsening sore throat, fever, pus on tonsils start cefdinir 300mg  po BID x 10 days Rx given to patient.  Allergic to penicillin  Usually no specific medical treatment is needed if a virus is causing the sore throat.  The throat most often gets better on its own within 5 to 7 days.  Antibiotic medicine does not cure viral pharyngitis.   For acute pharyngitis caused by bacteria, your healthcare provider will prescribe an antibiotic.  Marland Kitchen Do not smoke.   Marland Kitchen Avoid secondhand smoke and other air pollutants.  . Use a cool mist humidifier to add moisture to the air.  . Get plenty of rest.  . You may want to rest your throat by talking less and eating a diet that is mostly liquid or soft for a day or two.   Marland Kitchen Nonprescription throat lozenges and mouthwashes should help relieve the soreness.   . Gargling with warm saltwater and drinking warm liquids may help.  (You can make a saltwater solution by adding 1/4 teaspoon of salt to 8 ounces, or 240 mL, of warm water.)  . A nonprescription pain reliever such as aspirin, acetaminophen, or ibuprofen may ease general aches and pains.   FOLLOW UP with clinic provider if no improvements in the next 7-10 days.  Patient verbalized understanding of instructions and agreed with plan of care. P2:  Hand washing and diet.  Olen Cordial, NP 10/13/15 1529

## 2015-10-12 NOTE — Discharge Instructions (Signed)
Acute Bronchitis °Bronchitis is inflammation of the airways that extend from the windpipe into the lungs (bronchi). The inflammation often causes mucus to develop. This leads to a cough, which is the most common symptom of bronchitis.  °In acute bronchitis, the condition usually develops suddenly and goes away over time, usually in a couple weeks. Smoking, allergies, and asthma can make bronchitis worse. Repeated episodes of bronchitis may cause further lung problems.  °CAUSES °Acute bronchitis is most often caused by the same virus that causes a cold. The virus can spread from person to person (contagious) through coughing, sneezing, and touching contaminated objects. °SIGNS AND SYMPTOMS  °· Cough.   °· Fever.   °· Coughing up mucus.   °· Body aches.   °· Chest congestion.   °· Chills.   °· Shortness of breath.   °· Sore throat.   °DIAGNOSIS  °Acute bronchitis is usually diagnosed through a physical exam. Your health care provider will also ask you questions about your medical history. Tests, such as chest X-rays, are sometimes done to rule out other conditions.  °TREATMENT  °Acute bronchitis usually goes away in a couple weeks. Oftentimes, no medical treatment is necessary. Medicines are sometimes given for relief of fever or cough. Antibiotic medicines are usually not needed but may be prescribed in certain situations. In some cases, an inhaler may be recommended to help reduce shortness of breath and control the cough. A cool mist vaporizer may also be used to help thin bronchial secretions and make it easier to clear the chest.  °HOME CARE INSTRUCTIONS °· Get plenty of rest.   °· Drink enough fluids to keep your urine clear or pale yellow (unless you have a medical condition that requires fluid restriction). Increasing fluids may help thin your respiratory secretions (sputum) and reduce chest congestion, and it will prevent dehydration.   °· Take medicines only as directed by your health care provider. °· If  you were prescribed an antibiotic medicine, finish it all even if you start to feel better. °· Avoid smoking and secondhand smoke. Exposure to cigarette smoke or irritating chemicals will make bronchitis worse. If you are a smoker, consider using nicotine gum or skin patches to help control withdrawal symptoms. Quitting smoking will help your lungs heal faster.   °· Reduce the chances of another bout of acute bronchitis by washing your hands frequently, avoiding people with cold symptoms, and trying not to touch your hands to your mouth, nose, or eyes.   °· Keep all follow-up visits as directed by your health care provider.   °SEEK MEDICAL CARE IF: °Your symptoms do not improve after 1 week of treatment.  °SEEK IMMEDIATE MEDICAL CARE IF: °· You develop an increased fever or chills.   °· You have chest pain.   °· You have severe shortness of breath. °· You have bloody sputum.   °· You develop dehydration. °· You faint or repeatedly feel like you are going to pass out. °· You develop repeated vomiting. °· You develop a severe headache. °MAKE SURE YOU:  °· Understand these instructions. °· Will watch your condition. °· Will get help right away if you are not doing well or get worse. °  °This information is not intended to replace advice given to you by your health care provider. Make sure you discuss any questions you have with your health care provider. °  °Document Released: 01/01/2005 Document Revised: 12/15/2014 Document Reviewed: 05/17/2013 °Elsevier Interactive Patient Education ©2016 Else[employer] Inc[employer] °Sinusitis, Adult °Sinusitis is redness, soreness, and inflammation of the paranasal sinuses. Paranasal sinuses are air pockets within the   bones of your face. They are located beneath your eyes, in the middle of your forehead, and above your eyes. In healthy paranasal sinuses, mucus is able to drain out, and air is able to circulate through them by way of your nose. However, when your paranasal sinuses are inflamed,  mucus and air can become trapped. This can allow bacteria and other germs to grow and cause infection. °Sinusitis can develop quickly and last only a short time (acute) or continue over a long period (chronic). Sinusitis that lasts for more than 12 weeks is considered chronic. °CAUSES °Causes of sinusitis include: °· Allergies. °· Structural abnormalities, such as displacement of the cartilage that separates your nostrils (deviated septum), which can decrease the air flow through your nose and sinuses and affect sinus drainage. °· Functional abnormalities, such as when the small hairs (cilia) that line your sinuses and help remove mucus do not work properly or are not present. °SIGNS AND SYMPTOMS °Symptoms of acute and chronic sinusitis are the same. The primary symptoms are pain and pressure around the affected sinuses. Other symptoms include: °· Upper toothache. °· Earache. °· Headache. °· Bad breath. °· Decreased sense of smell and taste. °· A cough, which worsens when you are lying flat. °· Fatigue. °· Fever. °· Thick drainage from your nose, which often is green and may contain pus (purulent). °· Swelling and warmth over the affected sinuses. °DIAGNOSIS °Your health care provider will perform a physical exam. During your exam, your health care provider may perform any of the following to help determine if you have acute sinusitis or chronic sinusitis: °· Look in your nose for signs of abnormal growths in your nostrils (nasal polyps). °· Tap over the affected sinus to check for signs of infection. °· View the inside of your sinuses using an imaging device that has a light attached (endoscope). °If your health care provider suspects that you have chronic sinusitis, one or more of the following tests may be recommended: °· Allergy tests. °· Nasal culture. A sample of mucus is taken from your nose, sent to a lab, and screened for bacteria. °· Nasal cytology. A sample of mucus is taken from your nose and examined by  your health care provider to determine if your sinusitis is related to an allergy. °TREATMENT °Most cases of acute sinusitis are related to a viral infection and will resolve on their own within 10 days. Sometimes, medicines are prescribed to help relieve symptoms of both acute and chronic sinusitis. These may include pain medicines, decongestants, nasal steroid sprays, or saline sprays. °However, for sinusitis related to a bacterial infection, your health care provider will prescribe antibiotic medicines. These are medicines that will help kill the bacteria causing the infection. °Rarely, sinusitis is caused by a fungal infection. In these cases, your health care provider will prescribe antifungal medicine. °For some cases of chronic sinusitis, surgery is needed. Generally, these are cases in which sinusitis recurs more than 3 times per year, despite other treatments. °HOME CARE INSTRUCTIONS °· Drink plenty of water. Water helps thin the mucus so your sinuses can drain more easily. °· Use a humidifier. °· Inhale steam 3-4 times a day (for example, sit in the bathroom with the shower running). °· Apply a warm, moist washcloth to your face 3-4 times a day, or as directed by your health care provider. °· Use saline nasal sprays to help moisten and clean your sinuses. °· Take medicines only as directed by your health care provider. °· If   you were prescribed either an antibiotic or antifungal medicine, finish it all even if you start to feel better. SEEK IMMEDIATE MEDICAL CARE IF:  You have increasing pain or severe headaches.  You have nausea, vomiting, or drowsiness.  You have swelling around your face.  You have vision problems.  You have a stiff neck.  You have difficulty breathing.   This information is not intended to replace advice given to you by your health care provider. Make sure you discuss any questions you have with your health care provider.   Document Released: 11/24/2005 Document  Revised: 12/15/2014 Document Reviewed: 12/09/2011 Elsevier Interactive Patient Education 2016 [employer]. Viral Gastroenteritis Viral gastroenteritis is also known as stomach flu. This condition affects the stomach and intestinal tract. It can cause sudden diarrhea and vomiting. The illness typically lasts 3 to 8 days. Most people develop an immune response that eventually gets rid of the virus. While this natural response develops, the virus can make you quite ill. CAUSES  Many different viruses can cause gastroenteritis, such as rotavirus or noroviruses. You can catch one of these viruses by consuming contaminated food or water. You may also catch a virus by sharing utensils or other personal items with an infected person or by touching a contaminated surface. SYMPTOMS  The most common symptoms are diarrhea and vomiting. These problems can cause a severe loss of body fluids (dehydration) and a body salt (electrolyte) imbalance. Other symptoms may include:  Fever.  Headache.  Fatigue.  Abdominal pain. DIAGNOSIS  Your caregiver can usually diagnose viral gastroenteritis based on your symptoms and a physical exam. A stool sample may also be taken to test for the presence of viruses or other infections. TREATMENT  This illness typically goes away on its own. Treatments are aimed at rehydration. The most serious cases of viral gastroenteritis involve vomiting so severely that you are not able to keep fluids down. In these cases, fluids must be given through an intravenous line (IV). HOME CARE INSTRUCTIONS   Drink enough fluids to keep your urine clear or pale yellow. Drink small amounts of fluids frequently and increase the amounts as tolerated.  Ask your caregiver for specific rehydration instructions.  Avoid:  Foods high in sugar.  Alcohol.  Carbonated drinks.  Tobacco.  Juice.  Caffeine drinks.  Extremely hot or cold fluids.  Fatty, greasy foods.  Too much intake of  anything at one time.  Dairy products until 24 to 48 hours after diarrhea stops.  You may consume probiotics. Probiotics are active cultures of beneficial bacteria. They may lessen the amount and number of diarrheal stools in adults. Probiotics can be found in yogurt with active cultures and in supplements.  Wash your hands well to avoid spreading the virus.  Only take over-the-counter or prescription medicines for pain, discomfort, or fever as directed by your caregiver. Do not give aspirin to children. Antidiarrheal medicines are not recommended.  Ask your caregiver if you should continue to take your regular prescribed and over-the-counter medicines.  Keep all follow-up appointments as directed by your caregiver. SEEK IMMEDIATE MEDICAL CARE IF:   You are unable to keep fluids down.  You do not urinate at least once every 6 to 8 hours.  You develop shortness of breath.  You notice blood in your stool or vomit. This may look like coffee grounds.  You have abdominal pain that increases or is concentrated in one small area (localized).  You have persistent vomiting or diarrhea.  You have a  fever.  The patient is a child younger than 3 months, and he or she has a fever.  The patient is a child older than 3 months, and he or she has a fever and persistent symptoms.  The patient is a child older than 3 months, and he or she has a fever and symptoms suddenly get worse.  The patient is a baby, and he or she has no tears when crying. MAKE SURE YOU:   Understand these instructions.  Will watch your condition.  Will get help right away if you are not doing well or get worse.   This information is not intended to replace advice given to you by your health care provider. Make sure you discuss any questions you have with your health care provider.   Document Released: 11/24/2005 Document Revised: 02/16/2012 Document Reviewed: 09/10/2011 Elsevier Interactive Patient Education 2016  Elsevier Inc. Pharyngitis Pharyngitis is redness, pain, and swelling (inflammation) of your pharynx.  CAUSES  Pharyngitis is usually caused by infection. Most of the time, these infections are from viruses (viral) and are part of a cold. However, sometimes pharyngitis is caused by bacteria (bacterial). Pharyngitis can also be caused by allergies. Viral pharyngitis may be spread from person to person by coughing, sneezing, and personal items or utensils (cups, forks, spoons, toothbrushes). Bacterial pharyngitis may be spread from person to person by more intimate contact, such as kissing.  SIGNS AND SYMPTOMS  Symptoms of pharyngitis include:   Sore throat.   Tiredness (fatigue).   Low-grade fever.   Headache.  Joint pain and muscle aches.  Skin rashes.  Swollen lymph nodes.  Plaque-like film on throat or tonsils (often seen with bacterial pharyngitis). DIAGNOSIS  Your health care provider will ask you questions about your illness and your symptoms. Your medical history, along with a physical exam, is often all that is needed to diagnose pharyngitis. Sometimes, a rapid strep test is done. Other lab tests may also be done, depending on the suspected cause.  TREATMENT  Viral pharyngitis will usually get better in 3-4 days without the use of medicine. Bacterial pharyngitis is treated with medicines that kill germs (antibiotics).  HOME CARE INSTRUCTIONS   Drink enough water and fluids to keep your urine clear or pale yellow.   Only take over-the-counter or prescription medicines as directed by your health care provider:   If you are prescribed antibiotics, make sure you finish them even if you start to feel better.   Do not take aspirin.   Get lots of rest.   Gargle with 8 oz of salt water ( tsp of salt per 1 qt of water) as often as every 1-2 hours to soothe your throat.   Throat lozenges (if you are not at risk for choking) or sprays may be used to soothe your  throat. SEEK MEDICAL CARE IF:   You have large, tender lumps in your neck.  You have a rash.  You cough up green, yellow-brown, or bloody spit. SEEK IMMEDIATE MEDICAL CARE IF:   Your neck becomes stiff.  You drool or are unable to swallow liquids.  You vomit or are unable to keep medicines or liquids down.  You have severe pain that does not go away with the use of recommended medicines.  You have trouble breathing (not caused by a stuffy nose). MAKE SURE YOU:   Understand these instructions.  Will watch your condition.  Will get help right away if you are not doing well or get worse.  This information is not intended to replace advice given to you by your health care provider. Make sure you discuss any questions you have with your health care provider.   Document Released: 11/24/2005 Document Revised: 09/14/2013 Document Reviewed: 08/01/2013 Elsevier Interactive Patient Education 2016 [employer]. Otitis Media With Effusion Otitis media with effusion is the presence of fluid in the middle ear. This is a common problem in children, which often follows ear infections. It may be present for weeks or longer after the infection. Unlike an acute ear infection, otitis media with effusion refers only to fluid behind the ear drum and not infection. Children with repeated ear and sinus infections and allergy problems are the most likely to get otitis media with effusion. CAUSES  The most frequent cause of the fluid buildup is dysfunction of the eustachian tubes. These are the tubes that drain fluid in the ears to the back of the nose (nasopharynx). SYMPTOMS   The main symptom of this condition is hearing loss. As a result, you or your child may:  Listen to the TV at a loud volume.  Not respond to questions.  Ask "what" often when spoken to.  Mistake or confuse one sound or word for another.  There may be a sensation of fullness or pressure but usually not pain. DIAGNOSIS    Your health care provider will diagnose this condition by examining you or your child's ears.  Your health care provider may test the pressure in you or your child's ear with a tympanometer.  A hearing test may be conducted if the problem persists. TREATMENT   Treatment depends on the duration and the effects of the effusion.  Antibiotics, decongestants, nose drops, and cortisone-type drugs (tablets or nasal spray) may not be helpful.  Children with persistent ear effusions may have delayed language or behavioral problems. Children at risk for developmental delays in hearing, learning, and speech may require referral to a specialist earlier than children not at risk.  You or your child's health care provider may suggest a referral to an ear, nose, and throat surgeon for treatment. The following may help restore normal hearing:  Drainage of fluid.  Placement of ear tubes (tympanostomy tubes).  Removal of adenoids (adenoidectomy). HOME CARE INSTRUCTIONS   Avoid secondhand smoke.  Infants who are breastfed are less likely to have this condition.  Avoid feeding infants while they are lying flat.  Avoid known environmental allergens.  Avoid people who are sick. SEEK MEDICAL CARE IF:   Hearing is not better in 3 months.  Hearing is worse.  Ear pain.  Drainage from the ear.  Dizziness. MAKE SURE YOU:   Understand these instructions.  Will watch your condition.  Will get help right away if you are not doing well or get worse.   This information is not intended to replace advice given to you by your health care provider. Make sure you discuss any questions you have with your health care provider.   Document Released: 01/01/2005 Document Revised: 12/15/2014 Document Reviewed: 06/21/2013 Elsevier Interactive Patient Education [employer].

## 2016-02-25 DIAGNOSIS — Z8679 Personal history of other diseases of the circulatory system: Secondary | ICD-10-CM | POA: Diagnosis not present

## 2016-02-25 DIAGNOSIS — M79672 Pain in left foot: Secondary | ICD-10-CM | POA: Diagnosis not present

## 2016-03-07 DIAGNOSIS — H5231 Anisometropia: Secondary | ICD-10-CM | POA: Diagnosis not present

## 2016-03-11 DIAGNOSIS — M659 Synovitis and tenosynovitis, unspecified: Secondary | ICD-10-CM | POA: Diagnosis not present

## 2016-08-16 ENCOUNTER — Ambulatory Visit
Admission: EM | Admit: 2016-08-16 | Discharge: 2016-08-16 | Disposition: A | Discharge status: Home / Self Care | Payer: Commercial Managed Care - HMO | Attending: Emergency Medicine | Admitting: Emergency Medicine

## 2016-08-16 DIAGNOSIS — J069 Acute upper respiratory infection, unspecified: Secondary | ICD-10-CM | POA: Diagnosis not present

## 2016-08-16 LAB — RAPID STREP SCREEN (MED CTR MEBANE ONLY): Streptococcus, Group A Screen (Direct): NEGATIVE

## 2016-08-16 MED ORDER — BENZONATATE 100 MG PO CAPS: 100.0000 mg | ORAL_CAPSULE | Freq: Three times a day (TID) | ORAL | 0 refills | Status: DC | PRN

## 2016-08-16 MED ORDER — AZITHROMYCIN 250 MG PO TABS: 250.0000 mg | ORAL_TABLET | Freq: Every day | ORAL | 0 refills | Status: DC

## 2016-08-16 MED ORDER — HYDROCODONE-HOMATROPINE 5-1.5 MG/5ML PO SYRP: 5.0000 mL | ORAL_SOLUTION | Freq: Four times a day (QID) | ORAL | 0 refills | Status: DC | PRN

## 2016-08-16 NOTE — ED Triage Notes (Signed)
Patient c/o of chest congestion, sore throat, post nasal drip, cough is nonproductive that has lasted about 2-3 weeks.

## 2016-08-16 NOTE — ED Provider Notes (Signed)
CSN: [id number]     Arrival date & time 08/16/16  0800 History   First MD Initiated Contact with Patient 08/16/16 [phone]     Chief Complaint  Patient presents with  . Nasal Congestion   HPI the patient presents today for evaluation of coffee nasal congestion ongoing for 2 weeks. Patient states that once he or she will get an upper respiratory infection, she began taking her allergy medication when The Simpsons began, without relief. She has been taking Mucinex as well as her take on a daily basis. She reports a cough primarily at night without production. She notes a sore throat, scratchy in nature. She notes mild facial pressure however denies any today's visit. She does not have any allergies to medications besides penicillin.   Past Medical History:  Diagnosis Date  . Lumbago   . Paroxysmal atrial tachycardia ([place])   . Tubular adenoma of colon    Past Surgical History:  Procedure Laterality Date  . ABDOMINAL HYSTERECTOMY    . CARPAL TUNNEL RELEASE    . TMJ ARTHROPLASTY    . TONSILLECTOMY AND ADENOIDECTOMY     Family History  Problem Relation Age of Onset  . Kidney disease Father     Deceased, 77  . Colon cancer Mother     Deceased 74  . Healthy Son    Social History  Substance Use Topics  . Smoking status: Former Research scientist (life sciences)  . Smokeless tobacco: Never Used  . Alcohol use No   OB History    No data available     Review of Systems  Constitutional: Positive for fever.  HENT: Positive for congestion, postnasal drip, sinus pressure and sore throat.   Eyes: Negative.   Respiratory: Positive for cough.   Cardiovascular: Negative.   Gastrointestinal: Negative.   Endocrine: Negative.   Genitourinary: Negative.   Musculoskeletal: Negative.   Skin: Negative.   Allergic/Immunologic: Negative.   Neurological: Negative.   Hematological: Negative.   Psychiatric/Behavioral: Negative.     Allergies  Penicillin v potassium and Shellfish allergy  Home Medications   Prior to  Admission medications   Medication Sig Start Date End Date Taking? Authorizing Provider  albuterol (PROVENTIL HFA;VENTOLIN HFA) 108 (90 BASE) MCG/ACT inhaler Inhale 1 puff into the lungs every 4 (four) hours as needed for wheezing or shortness of breath.   Yes Historical Provider, MD  Calcium Carbonate-Vitamin D (CALTRATE 600+D) 600-400 MG-UNIT per tablet Take 2 tablets by mouth daily.   Yes Historical Provider, MD  Cholecalciferol (VITAMIN D3) 2000 UNITS TABS Take 2 tablets by mouth daily.   Yes Historical Provider, MD  ferrous sulfate 325 (65 FE) MG tablet Take 325 mg by mouth daily with breakfast.   Yes Historical Provider, MD  fluticasone (FLONASE) 50 MCG/ACT nasal spray Place 2 sprays into both nostrils daily.   Yes Historical Provider, MD  fluticasone (FLONASE) 50 MCG/ACT nasal spray Place 1 spray into both nostrils 2 (two) times daily. 10/12/15  Yes Olen Cordial, NP  metoprolol succinate (TOPROL-XL) 25 MG 24 hr tablet Take 25 mg by mouth daily.   Yes Historical Provider, MD  Multiple Vitamins-Minerals (CENTRUM SILVER PO) Take 1 tablet by mouth daily.   Yes Historical Provider, MD  sodium chloride (OCEAN) 0.65 % SOLN nasal spray Place 2 sprays into both nostrils every 2 (two) hours while awake. 10/12/15  Yes Olen Cordial, NP  vitamin C (ASCORBIC ACID) 250 MG tablet Take 250 mg by mouth daily.   Yes Historical Provider, MD  vitamin E 400 UNIT capsule Take 400 Units by mouth daily.   Yes Historical Provider, MD  acetaminophen (TYLENOL) 500 MG tablet Take 1 tablet (500 mg total) by mouth every 6 (six) hours as needed for mild pain, moderate pain, fever or headache. 10/12/15   Olen Cordial, NP  azithromycin (ZITHROMAX) 250 MG tablet Take 1 tablet (250 mg total) by mouth daily. Take first 2 tablets together, then 1 every day until finished. 08/16/16   Lattie Corns, PA-C  benzonatate (TESSALON) 100 MG capsule Take 1 capsule (100 mg total) by mouth 3 (three) times daily as needed for  cough. 08/16/16   Lattie Corns, PA-C  cefdinir (OMNICEF) 300 MG capsule Take 1 capsule (300 mg total) by mouth 2 (two) times daily. 10/12/15   Olen Cordial, NP  EPINEPHrine (EPI-PEN) 0.3 mg/0.3 mL SOAJ injection Inject into the muscle as directed.    Historical Provider, MD  HYDROcodone-homatropine (HYCODAN) 5-1.5 MG/5ML syrup Take 5 mLs by mouth every 6 (six) hours as needed for cough. 08/16/16   Lattie Corns, PA-C  meloxicam (MOBIC) 7.5 MG tablet Take 7.5 mg by mouth daily.    Historical Provider, MD  predniSONE (DELTASONE) 20 MG tablet Take 2 tablets (40 mg total) by mouth daily with breakfast. 10/12/15   Olen Cordial, NP   Meds Ordered and Administered this Visit  Medications - No data to display  BP 121/83 (BP Location: Left Arm)   Pulse 82   Temp 98 F (36.7 C) (Tympanic)   Resp 18   Ht 5\' 6"  (1.676 m)   Wt 170 lb (77.1 kg)   SpO2 97%   BMI 27.44 kg/m  No data found.   Physical Exam  Constitutional: Vital signs are normal. She appears well-developed and well-nourished. She does not appear ill.  HENT:  Right Ear: Tympanic membrane normal.  Left Ear: Tympanic membrane normal.  Nose: Mucosal edema present.  Mouth/Throat: Uvula is midline. Posterior oropharyngeal erythema present.  Mucosal erythema to bilateral nasal canals No tenderness with palpation of sinuses.  Neck:  No palpable cervical lymphadenopathy  Cardiovascular: Normal rate, regular rhythm, S1 normal, S2 normal, normal heart sounds and normal pulses.   Pulmonary/Chest: Effort normal and breath sounds normal.  Neurological: She is alert.    Urgent Care Course   Clinical Course   Procedures (including critical care time)  Labs Review Labs Reviewed  RAPID STREP SCREEN (NOT AT [facility])  CULTURE, GROUP A STREP [facility])   Imaging Review No results found.  MDM   1. URI (upper respiratory infection)   -Pt prescribed a z-pak, Tessalon pearls as well as Hycodan cough syrup for night-time  symptoms. -She was encouraged to continue to use her allergy medications on a regular basis.  Mucinex daily as well. -Follow-up with PCP if no improvement in symptoms following completion of antibiotic.    Lattie Corns, [place] 08/16/16 [phone]

## 2016-08-19 DIAGNOSIS — H60332 Swimmer's ear, left ear: Secondary | ICD-10-CM | POA: Diagnosis not present

## 2016-08-19 LAB — CULTURE, GROUP A STREP (THRC)

## 2016-08-25 DIAGNOSIS — Z0000 Encounter for general adult medical examination without abnormal findings: Secondary | ICD-10-CM | POA: Diagnosis not present

## 2016-09-01 DIAGNOSIS — Z780 Asymptomatic menopausal state: Secondary | ICD-10-CM | POA: Diagnosis not present

## 2016-09-01 DIAGNOSIS — Z0000 Encounter for general adult medical examination without abnormal findings: Secondary | ICD-10-CM | POA: Diagnosis not present

## 2016-09-01 DIAGNOSIS — Z23 Encounter for immunization: Secondary | ICD-10-CM | POA: Diagnosis not present

## 2016-09-22 ENCOUNTER — Other Ambulatory Visit: Payer: Self-pay | Admitting: Family Medicine

## 2016-09-22 DIAGNOSIS — Z780 Asymptomatic menopausal state: Secondary | ICD-10-CM | POA: Diagnosis not present

## 2016-10-08 DIAGNOSIS — M8580 Other specified disorders of bone density and structure, unspecified site: Secondary | ICD-10-CM | POA: Diagnosis not present

## 2016-11-10 DIAGNOSIS — M25531 Pain in right wrist: Secondary | ICD-10-CM | POA: Diagnosis not present

## 2016-11-10 DIAGNOSIS — M25562 Pain in left knee: Secondary | ICD-10-CM | POA: Diagnosis not present

## 2016-12-14 ENCOUNTER — Ambulatory Visit
Admission: EM | Admit: 2016-12-14 | Discharge: 2016-12-14 | Disposition: A | Discharge status: Home / Self Care | Payer: Commercial Managed Care - HMO | Attending: Emergency Medicine | Admitting: Emergency Medicine

## 2016-12-14 ENCOUNTER — Encounter: Payer: Self-pay | Admitting: *Deleted

## 2016-12-14 DIAGNOSIS — J111 Influenza due to unidentified influenza virus with other respiratory manifestations: Secondary | ICD-10-CM

## 2016-12-14 DIAGNOSIS — R69 Illness, unspecified: Secondary | ICD-10-CM

## 2016-12-14 LAB — RAPID INFLUENZA A&B ANTIGENS (ARMC ONLY): INFLUENZA B (ARMC): NEGATIVE

## 2016-12-14 LAB — RAPID INFLUENZA A&B ANTIGENS: Influenza A (ARMC): NEGATIVE

## 2016-12-14 MED ORDER — HYDROCOD POLST-CPM POLST ER 10-8 MG/5ML PO SUER
5.0000 mL | Freq: Every evening | ORAL | 0 refills | Status: DC | PRN
Start: 2016-12-14 — End: 2017-04-08

## 2016-12-14 MED ORDER — OSELTAMIVIR PHOSPHATE 75 MG PO CAPS: 75.0000 mg | ORAL_CAPSULE | Freq: Two times a day (BID) | ORAL | 0 refills | Status: DC

## 2016-12-14 NOTE — Discharge Instructions (Signed)
Take medication as prescribed. Rest. Drink plenty of fluids.  ° °Follow up with your primary care physician this week as needed. Return to Urgent care for new or worsening concerns.  ° °

## 2016-12-14 NOTE — ED Triage Notes (Signed)
PAtient started having symptoms of chest congestion, cough, and body aches 2 days ago. Additional symptoms of nasal congestion and sore throat started yesterday.

## 2016-12-14 NOTE — ED Provider Notes (Signed)
[facility] ____________________________________________  Time seen: Approximately 8:29 AM  I have reviewed the triage vital signs and the nursing notes.   HISTORY  Chief Complaint Cough and Nasal Congestion   HPI Traci Lynn is a 67 y.o. female presents with husband at bedside for the complaints of 2 days of runny nose, nasal congestion, cough, chest congestion chills and bodyaches. Reports body aches from head to toe. Reports mild sore throat from coughing. States has been alternating Tylenol and ibuprofen, and sure of known fevers. Reports symptoms unresolved with Tylenol, ibuprofen and Mucinex. 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 Reports continues to drink fluids well. Denies chest pain, shortness of breath, abdominal pain, dysuria, extremity pain or rash.  Dion Body, MD: PCP    Past Medical History:  Diagnosis Date  . Lumbago   . Paroxysmal atrial tachycardia ([place])   . Tubular adenoma of colon     Patient Active Problem List   Diagnosis Date Noted  . Left arm pain 01/20/2014    Past Surgical History:  Procedure Laterality Date  . ABDOMINAL HYSTERECTOMY    . CARPAL TUNNEL RELEASE    . TMJ ARTHROPLASTY    . TONSILLECTOMY AND ADENOIDECTOMY      No current facility-administered medications for this encounter.   Current Outpatient Prescriptions:  .  acetaminophen (TYLENOL) 500 MG tablet, Take 1 tablet (500 mg total) by mouth every 6 (six) hours as needed for mild pain, moderate pain, fever or headache., Disp: 30 tablet, Rfl: 0 .  albuterol (PROVENTIL HFA;VENTOLIN HFA) 108 (90 BASE) MCG/ACT inhaler, Inhale 1 puff into the lungs every 4 (four) hours as needed for wheezing or shortness of breath., Disp: , Rfl:  .  Calcium Carbonate-Vitamin D (CALTRATE 600+D) 600-400 MG-UNIT per tablet, Take 2 tablets by mouth daily., Disp: , Rfl:  .  Cholecalciferol (VITAMIN D3) 2000 UNITS TABS, Take 2 tablets by mouth daily.,  Disp: , Rfl:  .  ferrous sulfate 325 (65 FE) MG tablet, Take 325 mg by mouth daily with breakfast., Disp: , Rfl:  .  fluticasone (FLONASE) 50 MCG/ACT nasal spray, Place 1 spray into both nostrils 2 (two) times daily., Disp: 16 g, Rfl: 0 .  metoprolol succinate (TOPROL-XL) 25 MG 24 hr tablet, Take 25 mg by mouth daily., Disp: , Rfl:  .  Multiple Vitamins-Minerals (CENTRUM SILVER PO), Take 1 tablet by mouth daily., Disp: , Rfl:  .  vitamin C (ASCORBIC ACID) 250 MG tablet, Take 250 mg by mouth daily., Disp: , Rfl:  .  vitamin E 400 UNIT capsule, Take 400 Units by mouth daily., Disp: , Rfl:  .  azithromycin (ZITHROMAX) 250 MG tablet, Take 1 tablet (250 mg total) by mouth daily. Take first 2 tablets together, then 1 every day until finished., Disp: 6 tablet, Rfl: 0 .  benzonatate (TESSALON) 100 MG capsule, Take 1 capsule (100 mg total) by mouth 3 (three) times daily as needed for cough., Disp: 21 capsule, Rfl: 0 .  cefdinir (OMNICEF) 300 MG capsule, Take 1 capsule (300 mg total) by mouth 2 (two) times daily., Disp: 20 capsule, Rfl: 0 .  chlorpheniramine-HYDROcodone (TUSSIONEX PENNKINETIC ER) 10-8 MG/5ML SUER, Take 5 mLs by mouth at bedtime as needed for cough. do not drive or operate machinery while taking as can cause drowsiness., Disp: 75 mL, Rfl: 0 .  EPINEPHrine (EPI-PEN) 0.3 mg/0.3 mL SOAJ injection, Inject into the muscle as directed., Disp: , Rfl:  .  fluticasone (FLONASE) 50 MCG/ACT  nasal spray, Place 2 sprays into both nostrils daily., Disp: , Rfl:  .  HYDROcodone-homatropine (HYCODAN) 5-1.5 MG/5ML syrup, Take 5 mLs by mouth every 6 (six) hours as needed for cough., Disp: 120 mL, Rfl: 0 .  meloxicam (MOBIC) 7.5 MG tablet, Take 7.5 mg by mouth daily., Disp: , Rfl:  .  oseltamivir (TAMIFLU) 75 MG capsule, Take 1 capsule (75 mg total) by mouth every 12 (twelve) hours., Disp: 10 capsule, Rfl: 0 .  predniSONE (DELTASONE) 20 MG tablet, Take 2 tablets (40 mg total) by mouth daily with breakfast., Disp:  10 tablet, Rfl: 0 .  sodium chloride (OCEAN) 0.65 % SOLN nasal spray, Place 2 sprays into both nostrils every 2 (two) hours while awake., Disp: , Rfl: 0  Allergies Pork-derived products; Penicillin v potassium; and Shellfish allergy  Family History  Problem Relation Age of Onset  . Kidney disease Father     Deceased, 71  . Colon cancer Mother     Deceased 20  . Healthy Son     Social History Social History  Substance Use Topics  . Smoking status: Former Research scientist (life sciences)  . Smokeless tobacco: Never Used  . Alcohol use No    Review of Systems Constitutional:As above.  Eyes: No visual changes. ENT: as above.  Cardiovascular: Denies chest pain. Respiratory: Denies shortness of breath. Gastrointestinal: No abdominal pain.  No nausea, no vomiting.  No diarrhea.  No constipation. Genitourinary: Negative for dysuria. Musculoskeletal: Negative for back pain. Skin: Negative for rash. Neurological: Negative for headaches, focal weakness or numbness.  10-point ROS otherwise negative.  ____________________________________________   PHYSICAL EXAM:  VITAL SIGNS: ED Triage Vitals  Enc Vitals Group     BP 12/14/16 0820 (!) 149/83     Pulse Rate 12/14/16 0820 72     Resp 12/14/16 0820 16     Temp 12/14/16 0820 99.1 F (37.3 C)     Temp Source 12/14/16 0820 Oral     SpO2 12/14/16 0820 99 %     Weight 12/14/16 0822 172 lb (78 kg)     Height 12/14/16 0822 5\' 6"  (1.676 m)     Head Circumference --      Peak Flow --      Pain Score 12/14/16 0828 0     Pain Loc --      Pain Edu? --      Excl. in [place]? --     Constitutional: Alert and oriented. Well appearing and in no acute distress. Eyes: Conjunctivae are normal. PERRL. EOMI. Head: Atraumatic. No sinus tenderness to palpation. No swelling. No erythema.  Ears: no erythema, normal TMs bilaterally.   Nose:Nasal congestion with clear rhinorrhea  Mouth/Throat: Mucous membranes are moist. No pharyngeal erythema. No tonsillar swelling or  exudate.  Neck: No stridor.  No cervical spine tenderness to palpation. Hematological/Lymphatic/Immunilogical: No cervical lymphadenopathy. Cardiovascular: Normal rate, regular rhythm. Grossly normal heart sounds.  Good peripheral circulation. Respiratory: Normal respiratory effort.  No retractionsNo wheezes, rales or rhonchi. Good air movement.  Gastrointestinal: Soft and nontender. No CVA tenderness. Musculoskeletal Ambulatory with steady gait. No cervical, thoracic or lumbar tenderness to palpation. Neurologic:  Normal speech and language.  No gait instability. Skin:  Skin is warm, dry and intact. No rash noted. Psychiatric: Mood and affect are normal. Speech and behavior are normal. ___________________________________________   LABS (all labs ordered are listed, but only abnormal results are displayed)  Labs Reviewed  RAPID INFLUENZA A&B ANTIGENS ([place])     PROCEDURES Procedures  INITIAL IMPRESSION / ASSESSMENT AND PLAN / ED COURSE  Pertinent labs & imaging results that were available during my care of the patient were reviewed by me and considered in my medical decision making (see chart for details).  Well-appearing patient. No acute distress. Suspect influenza-like illness. Discussed in detail with patient options for treatment. Will treat patient with oral Tamiflu, when necessary Tussionex at night and encouraged supportive care, fluids, over-the-counter Tylenol or ibuprofen as needed.Discussed indication, risks and benefits of medications with patient.  Discussed follow up with Primary care physician this week. Discussed follow up and return parameters including no resolution or any worsening concerns. Patient verbalized understanding and agreed to plan.   ____________________________________________   FINAL CLINICAL IMPRESSION(S) / ED DIAGNOSES  Final diagnoses:  Influenza-like illness     New Prescriptions   CHLORPHENIRAMINE-HYDROCODONE (TUSSIONEX  PENNKINETIC ER) 10-8 MG/5ML SUER    Take 5 mLs by mouth at bedtime as needed for cough. do not drive or operate machinery while taking as can cause drowsiness.   OSELTAMIVIR (TAMIFLU) 75 MG CAPSULE    Take 1 capsule (75 mg total) by mouth every 12 (twelve) hours.    Note: This dictation was prepared with Dragon dictation along with smaller phrase technology. Any transcriptional errors that result from this process are unintentional.    Clinical Course       Marylene Land, NP 12/14/16 [phone]

## 2016-12-19 DIAGNOSIS — R05 Cough: Secondary | ICD-10-CM | POA: Diagnosis not present

## 2016-12-19 DIAGNOSIS — M19012 Primary osteoarthritis, left shoulder: Secondary | ICD-10-CM | POA: Diagnosis not present

## 2016-12-19 DIAGNOSIS — M25531 Pain in right wrist: Secondary | ICD-10-CM | POA: Diagnosis not present

## 2016-12-19 DIAGNOSIS — J069 Acute upper respiratory infection, unspecified: Secondary | ICD-10-CM | POA: Diagnosis not present

## 2016-12-19 DIAGNOSIS — S6991XA Unspecified injury of right wrist, hand and finger(s), initial encounter: Secondary | ICD-10-CM | POA: Diagnosis not present

## 2016-12-19 DIAGNOSIS — M25562 Pain in left knee: Secondary | ICD-10-CM | POA: Diagnosis not present

## 2016-12-19 DIAGNOSIS — S8992XA Unspecified injury of left lower leg, initial encounter: Secondary | ICD-10-CM | POA: Diagnosis not present

## 2017-01-16 DIAGNOSIS — M25562 Pain in left knee: Secondary | ICD-10-CM | POA: Diagnosis not present

## 2017-01-16 DIAGNOSIS — M25512 Pain in left shoulder: Secondary | ICD-10-CM | POA: Diagnosis not present

## 2017-01-16 DIAGNOSIS — M25531 Pain in right wrist: Secondary | ICD-10-CM | POA: Diagnosis not present

## 2017-01-16 DIAGNOSIS — M19012 Primary osteoarthritis, left shoulder: Secondary | ICD-10-CM | POA: Diagnosis not present

## 2017-01-16 DIAGNOSIS — S6991XA Unspecified injury of right wrist, hand and finger(s), initial encounter: Secondary | ICD-10-CM | POA: Diagnosis not present

## 2017-01-16 DIAGNOSIS — S8992XA Unspecified injury of left lower leg, initial encounter: Secondary | ICD-10-CM | POA: Diagnosis not present

## 2017-01-16 DIAGNOSIS — R05 Cough: Secondary | ICD-10-CM | POA: Diagnosis not present

## 2017-03-18 DIAGNOSIS — H2513 Age-related nuclear cataract, bilateral: Secondary | ICD-10-CM | POA: Diagnosis not present

## 2017-03-31 DIAGNOSIS — H25013 Cortical age-related cataract, bilateral: Secondary | ICD-10-CM | POA: Diagnosis not present

## 2017-03-31 DIAGNOSIS — H2513 Age-related nuclear cataract, bilateral: Secondary | ICD-10-CM | POA: Diagnosis not present

## 2017-04-06 DIAGNOSIS — H2513 Age-related nuclear cataract, bilateral: Secondary | ICD-10-CM | POA: Diagnosis not present

## 2017-04-08 ENCOUNTER — Encounter: Payer: Self-pay | Admitting: *Deleted

## 2017-04-14 NOTE — H&P (Signed)
See scanned note.

## 2017-04-15 ENCOUNTER — Ambulatory Visit: Payer: Medicare HMO | Admitting: Anesthesiology

## 2017-04-15 ENCOUNTER — Encounter
Admission: RE | Disposition: A | Discharge status: Home / Self Care | Payer: Self-pay | Source: Ambulatory Visit | Attending: Ophthalmology

## 2017-04-15 ENCOUNTER — Ambulatory Visit
Admission: RE | Admit: 2017-04-15 | Discharge: 2017-04-15 | Disposition: A | Discharge status: Home / Self Care | Payer: Medicare HMO | Source: Ambulatory Visit | Attending: Ophthalmology | Admitting: Ophthalmology

## 2017-04-15 ENCOUNTER — Encounter: Payer: Self-pay | Admitting: *Deleted

## 2017-04-15 DIAGNOSIS — Z87891 Personal history of nicotine dependence: Secondary | ICD-10-CM | POA: Insufficient documentation

## 2017-04-15 DIAGNOSIS — H2513 Age-related nuclear cataract, bilateral: Secondary | ICD-10-CM | POA: Diagnosis not present

## 2017-04-15 DIAGNOSIS — I471 Supraventricular tachycardia: Secondary | ICD-10-CM | POA: Insufficient documentation

## 2017-04-15 DIAGNOSIS — H2511 Age-related nuclear cataract, right eye: Secondary | ICD-10-CM | POA: Diagnosis not present

## 2017-04-15 HISTORY — DX: Cardiac arrhythmia, unspecified: I49.9

## 2017-04-15 HISTORY — PX: CATARACT EXTRACTION W/PHACO: SHX586

## 2017-04-15 SURGERY — PHACOEMULSIFICATION, CATARACT, WITH IOL INSERTION
Anesthesia: Monitor Anesthesia Care | Site: Eye | Laterality: Right | Wound class: Clean

## 2017-04-15 MED ORDER — TETRACAINE HCL 0.5 % OP SOLN
OPHTHALMIC | Status: AC
  Filled 2017-04-15: qty 2

## 2017-04-15 MED ORDER — MIDAZOLAM HCL 2 MG/2ML IJ SOLN
INTRAMUSCULAR | Status: AC
  Filled 2017-04-15: qty 2

## 2017-04-15 MED ORDER — PHENYLEPHRINE HCL 10 % OP SOLN
1.0000 [drp] | OPHTHALMIC | Status: AC
  Administered 2017-04-15 (×4): 1 [drp] via OPHTHALMIC

## 2017-04-15 MED ORDER — HYALURONIDASE HUMAN 150 UNIT/ML IJ SOLN
INTRAMUSCULAR | Status: AC
  Filled 2017-04-15: qty 1

## 2017-04-15 MED ORDER — MOXIFLOXACIN HCL 0.5 % OP SOLN
OPHTHALMIC | Status: AC
  Administered 2017-04-15: 1 [drp] via OPHTHALMIC
  Filled 2017-04-15: qty 3

## 2017-04-15 MED ORDER — CARBACHOL 0.01 % IO SOLN
INTRAOCULAR | Status: DC | PRN
  Administered 2017-04-15: 0.5 mL via INTRAOCULAR

## 2017-04-15 MED ORDER — MOXIFLOXACIN HCL 0.5 % OP SOLN
OPHTHALMIC | Status: DC | PRN
  Administered 2017-04-15: 0.2 mL via OPHTHALMIC

## 2017-04-15 MED ORDER — CYCLOPENTOLATE HCL 2 % OP SOLN
OPHTHALMIC | Status: AC
  Administered 2017-04-15: 1 [drp] via OPHTHALMIC
  Filled 2017-04-15: qty 2

## 2017-04-15 MED ORDER — BUPIVACAINE HCL (PF) 0.75 % IJ SOLN
INTRAMUSCULAR | Status: AC
  Filled 2017-04-15: qty 10

## 2017-04-15 MED ORDER — MOXIFLOXACIN HCL 0.5 % OP SOLN
1.0000 [drp] | OPHTHALMIC | Status: AC
  Administered 2017-04-15 (×3): 1 [drp] via OPHTHALMIC

## 2017-04-15 MED ORDER — PHENYLEPHRINE HCL 10 % OP SOLN
OPHTHALMIC | Status: AC
  Administered 2017-04-15: 1 [drp] via OPHTHALMIC
  Filled 2017-04-15: qty 5

## 2017-04-15 MED ORDER — LIDOCAINE HCL (PF) 4 % IJ SOLN
INTRAOCULAR | Status: DC | PRN
  Administered 2017-04-15: 4 mL via OPHTHALMIC

## 2017-04-15 MED ORDER — PROPARACAINE HCL 0.5 % OP SOLN
OPHTHALMIC | Status: DC | PRN
  Administered 2017-04-15: 2 [drp] via OPHTHALMIC

## 2017-04-15 MED ORDER — CEFUROXIME OPHTHALMIC INJECTION 1 MG/0.1 ML
INJECTION | OPHTHALMIC | Status: AC
  Filled 2017-04-15: qty 0.1

## 2017-04-15 MED ORDER — NA CHONDROIT SULF-NA HYALURON 40-17 MG/ML IO SOLN
INTRAOCULAR | Status: AC
  Filled 2017-04-15: qty 1

## 2017-04-15 MED ORDER — EPINEPHRINE PF 1 MG/ML IJ SOLN
INTRAOCULAR | Status: DC | PRN
  Administered 2017-04-15: 200 mL via OPHTHALMIC

## 2017-04-15 MED ORDER — ALFENTANIL 500 MCG/ML IJ INJ
INJECTION | INTRAVENOUS | Status: DC | PRN
  Administered 2017-04-15: 500 ug via INTRAVENOUS

## 2017-04-15 MED ORDER — POVIDONE-IODINE 5 % OP SOLN
OPHTHALMIC | Status: AC
  Filled 2017-04-15: qty 30

## 2017-04-15 MED ORDER — PROPARACAINE HCL 0.5 % OP SOLN
OPHTHALMIC | Status: AC
Start: 2017-04-15 — End: 2017-04-15
  Filled 2017-04-15: qty 15

## 2017-04-15 MED ORDER — LIDOCAINE HCL (PF) 4 % IJ SOLN
INTRAMUSCULAR | Status: DC | PRN
  Administered 2017-04-15: 4 mL via OPHTHALMIC

## 2017-04-15 MED ORDER — MIDAZOLAM HCL 2 MG/2ML IJ SOLN
INTRAMUSCULAR | Status: DC | PRN
  Administered 2017-04-15: 1 mg via INTRAVENOUS

## 2017-04-15 MED ORDER — EPINEPHRINE PF 1 MG/ML IJ SOLN
INTRAMUSCULAR | Status: AC
  Filled 2017-04-15: qty 2

## 2017-04-15 MED ORDER — SODIUM CHLORIDE 0.9 % IV SOLN
INTRAVENOUS | Status: DC
  Administered 2017-04-15: 09:00:00 via INTRAVENOUS

## 2017-04-15 MED ORDER — CYCLOPENTOLATE HCL 2 % OP SOLN
1.0000 [drp] | OPHTHALMIC | Status: AC
  Administered 2017-04-15 (×4): 1 [drp] via OPHTHALMIC

## 2017-04-15 MED ORDER — TETRACAINE HCL 0.5 % OP SOLN
OPHTHALMIC | Status: DC | PRN
  Administered 2017-04-15: 2 [drp] via OPHTHALMIC

## 2017-04-15 MED ORDER — POVIDONE-IODINE 5 % OP SOLN
OPHTHALMIC | Status: DC | PRN
  Administered 2017-04-15: 1 via OPHTHALMIC

## 2017-04-15 MED ORDER — NA CHONDROIT SULF-NA HYALURON 40-17 MG/ML IO SOLN
INTRAOCULAR | Status: DC | PRN
  Administered 2017-04-15: 1 mL via INTRAOCULAR

## 2017-04-15 SURGICAL SUPPLY — 32 items
CANNULA ANT/CHMB 27GA (MISCELLANEOUS) ×3 IMPLANT
CORD BIP STRL DISP 12FT (MISCELLANEOUS) ×3 IMPLANT
CUP MEDICINE 2OZ PLAST GRAD ST (MISCELLANEOUS) ×3 IMPLANT
DRAPE XRAY CASSETTE 23X24 (DRAPES) ×3 IMPLANT
ERASER HMR WETFIELD 18G (MISCELLANEOUS) ×3 IMPLANT
GLOVE BIO SURGEON STRL SZ8 (GLOVE) ×3 IMPLANT
GLOVE SURG LX 6.5 MICRO (GLOVE) ×2
GLOVE SURG LX 8.0 MICRO (GLOVE) ×2
GLOVE SURG LX STRL 6.5 MICRO (GLOVE) ×1 IMPLANT
GLOVE SURG LX STRL 8.0 MICRO (GLOVE) ×1 IMPLANT
GOWN STRL REUS W/ TWL LRG LVL3 (GOWN DISPOSABLE) ×1 IMPLANT
GOWN STRL REUS W/ TWL XL LVL3 (GOWN DISPOSABLE) ×1 IMPLANT
GOWN STRL REUS W/TWL LRG LVL3 (GOWN DISPOSABLE) ×2
GOWN STRL REUS W/TWL XL LVL3 (GOWN DISPOSABLE) ×2
LENS IOL ACRSF IQ TRC 4 18.0 ×1 IMPLANT
LENS IOL ACRYSOF IQ TORIC 18.0 ×2 IMPLANT
LENS IOL IQ TORIC 4 18.0 ×1 IMPLANT
MARKER PEN SURG W/LABELS VIOL (MISCELLANEOUS) ×6 IMPLANT
PACK CATARACT (MISCELLANEOUS) ×3 IMPLANT
PACK CATARACT DINGLEDEIN LX (MISCELLANEOUS) ×3 IMPLANT
PACK EYE AFTER SURG (MISCELLANEOUS) ×3 IMPLANT
SHLD EYE VISITEC  UNIV (MISCELLANEOUS) ×3 IMPLANT
SOL BSS BAG (MISCELLANEOUS) ×3
SOL PREP PVP 2OZ (MISCELLANEOUS) ×3
SOLUTION BSS BAG (MISCELLANEOUS) ×1 IMPLANT
SOLUTION PREP PVP 2OZ (MISCELLANEOUS) ×1 IMPLANT
SUT SILK 5-0 (SUTURE) ×3 IMPLANT
SYR 3ML LL SCALE MARK (SYRINGE) ×3 IMPLANT
SYR 5ML LL (SYRINGE) ×3 IMPLANT
SYR TB 1ML 27GX1/2 LL (SYRINGE) ×3 IMPLANT
WATER STERILE IRR 250ML POUR (IV SOLUTION) ×3 IMPLANT
WIPE NON LINTING 3.25X3.25 (MISCELLANEOUS) ×3 IMPLANT

## 2017-04-15 NOTE — Anesthesia Postprocedure Evaluation (Signed)
Anesthesia Post Note  Patient: Maisie C Bornemann  Procedure(s) Performed: Procedure(s) (LRB): CATARACT EXTRACTION PHACO AND INTRAOCULAR LENS PLACEMENT (IOC) (Right)  Patient location during evaluation: PACU Anesthesia Type: MAC Level of consciousness: awake, awake and alert and oriented Pain management: pain level controlled Vital Signs Assessment: post-procedure vital signs reviewed and stable Respiratory status: spontaneous breathing Cardiovascular status: blood pressure returned to baseline Postop Assessment: no signs of nausea or vomiting Anesthetic complications: no     Last Vitals:  Vitals:   04/15/17 1041 04/15/17 1046  BP: 132/65 132/65  Pulse: (!) 58 (!) 58  Resp: 16 20  Temp: 36.8 C 36.8 C    Last Pain:  Vitals:   04/15/17 1046  TempSrc: Temporal                 April Colter Lorenza Chick

## 2017-04-15 NOTE — Anesthesia Post-op Follow-up Note (Cosign Needed)
Anesthesia QCDR form completed.        

## 2017-04-15 NOTE — Discharge Instructions (Signed)
Eye Surgery Discharge Instructions  Expect mild scratchy sensation or mild soreness. DO NOT RUB YOUR EYE!  The day of surgery:  Minimal physical activity, but bed rest is not required  No reading, computer work, or close hand work  No bending, lifting, or straining.  May watch TV  For 24 hours:  No driving, legal decisions, or alcoholic beverages  Safety precautions  Eat anything you prefer: It is better to start with liquids, then soup then solid foods.  _____ Eye patch should be worn until postoperative exam tomorrow.  ____ Solar shield eyeglasses should be worn for comfort in the sunlight/patch while sleeping  Resume all regular medications including aspirin or Coumadin if these were discontinued prior to surgery. You may shower, bathe, shave, or wash your hair. Tylenol may be taken for mild discomfort.  Call your doctor if you experience significant pain, nausea, or vomiting, fever > 101 or other signs of infection. [phone] or [phone] Specific instructions:  Follow-up Information    [name], [name], MD Follow up.   Specialty:  Ophthalmology Why:  May 10 at 11:00am Contact information: [address]   [place] [address] [phone]

## 2017-04-15 NOTE — Interval H&P Note (Signed)
History and Physical Interval Note:  04/15/2017 9:50 AM  Traci Lynn  has presented today for surgery, with the diagnosis of CATARACT  The various methods of treatment have been discussed with the patient and family. After consideration of risks, benefits and other options for treatment, the patient has consented to  Procedure(s): CATARACT EXTRACTION PHACO AND INTRAOCULAR LENS PLACEMENT ([place]) (Right) as a surgical intervention .  The patient's history has been reviewed, patient examined, no change in status, stable for surgery.  I have reviewed the patient's chart and labs.  Questions were answered to the patient's satisfaction.     Keyaan Lederman

## 2017-04-15 NOTE — Transfer of Care (Signed)
Immediate Anesthesia Transfer of Care Note  Patient: Traci Lynn  Procedure(s) Performed: Procedure(s) with comments: CATARACT EXTRACTION PHACO AND INTRAOCULAR LENS PLACEMENT (IOC) (Right) - Lot# [id number] H [place]: 01:02.8 AP%: 19.7 CDE: 25.43  Patient Location: PACU  Anesthesia Type:MAC  Level of Consciousness: awake, alert  and oriented  Airway & Oxygen Therapy: spontaneous breathing   Post-op Assessment: Report given to RN and Post -op Vital signs reviewed and stable  Post vital signs: Reviewed and stable  Last Vitals:  Vitals:   04/15/17 1041 04/15/17 1046  BP: 132/65 132/65  Pulse: (!) 58 (!) 58  Resp: 16 20  Temp: 36.8 C 36.8 C    Last Pain:  Vitals:   04/15/17 1046  TempSrc: Temporal         Complications: No apparent anesthesia complications

## 2017-04-15 NOTE — Op Note (Signed)
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 Item Serial No. Manufacturer Lot No. LRB No. Used  LENS IOL TORIC 18.0 - [id number]   LENS IOL TORIC 18.0 [id number] ALCON   Right 1   Anesthesia: 2% Lidocaine and 4% Marcaine in a 50/50 mixture with 10 unites/ml of Hylenex given as a peribulbar Anesthesiologist: Anesthesiologist: Gunnar Bulla, MD CRNA: Hedda Slade, CRNA Complications: none Estimated Blood Loss: less than 1 ml  Description of procedure:  The patient sat upright and the eyes were anesthetized with topical proparacaine. With the patient fixing at a distant target the 3 o'clock and 9 o'clock positions at the limbus were marked with an sterile indelible surgical marking pen.  The patient was given anesthesia and sedation via intravenous access. The patient was then prepped and draped in the usual fashion. A 25-gauge needle was bent to use for starting the capsulorhexis. A 5-0 silk suture was placed through the conjunctiva superior and inferiorly to serve as bridle sutures.   The [place] system was engaged and registration was performed. The positions of the incisions and the steep axis of the astigmatism were identified by the [place] system and marked with an indelible pen. The Verion heads up display was turned off.  Hemostasis was obtained at the the position of the main incision using an eraser cautery. A partial thickness groove was made at the at that location with a 64 [place] blade and this was dissected anteriorly with an Avaya. The anterior chamber was entered at 10 o'clock with a 1.0 mm paracentesis knife and through the lamellar dissection with a 2.6 mm Alcon  keratome. Epi-Shugarcaine 0.5 CC [9 cc BSS Plus (Alcon), 3 cc 4% preservative-free lidocaine (Hospira) and 4 cc 1:1000 preservative-free, bisulfite-free epinephrine] was injected into the anterior chamber via the paracentesis tract. DiscoVisc was injected to replace the aqueous and a continuous tear curvilinear capsulorhexis was performed using a bent 25-gauge needle.  Balance salt on a syringe was used to perform hydro-dissection and phacoemulsification was carried out using a divide and conquer technique. Procedure(s) with comments: CATARACT EXTRACTION PHACO AND INTRAOCULAR LENS PLACEMENT (IOC) (Right) - Lot# [id number] H [place]: 01:02.8 AP%: 19.7 CDE: 25.43. Irrigation/aspiration was used to remove the residual cortex and the capsular bag was inflated with DiscoVisc. The intraocular lens was inserted into the capsular bag using a Monarch Delivery System cartridge. The Verion heads up display was turned on and the lens was rotated so that the marks on the base of the haptics were aligned with the steep axis of astigmatism as identified by the [place] unit.   Irrigation/aspiration was used to remove the residual DiscoVisc. The I/A hand piece was pressed down on top of the lens to prevent rotation. The wound was inflated with balanced salt and checked for leaks. None were found. The position of the Toric lens was reconfirmed using the [place] unit. Miostat was injected via the paracentesis track and 0.1 ml of Vigamox containing 1 mg of drug was injected via the paracentesis track. The wound was checked for leaks again and none were found.   The bridal sutures were removed and two drops of Vigamox were placed on the eye. An eye shield was placed to protect the eye and the patient was discharged to the recovery  area in good condition.   Polina Burmaster MD @T @

## 2017-04-15 NOTE — Anesthesia Preprocedure Evaluation (Signed)
Anesthesia Evaluation  Patient identified by MRN, date of birth, ID band Patient awake    Reviewed: Allergy & Precautions, NPO status , Patient's Chart, lab work & pertinent test results, reviewed documented beta blocker date and time   Airway Mallampati: III  TM Distance: >3 FB     Dental  (+) Chipped   Pulmonary former smoker,           Cardiovascular + dysrhythmias      Neuro/Psych    GI/Hepatic   Endo/Other    Renal/GU      Musculoskeletal   Abdominal   Peds  Hematology   Anesthesia Other Findings Atrial tachycardia.  Reproductive/Obstetrics                             Anesthesia Physical Anesthesia Plan  ASA: III  Anesthesia Plan: MAC   Post-op Pain Management:    Induction:   Airway Management Planned:   Additional Equipment:   Intra-op Plan:   Post-operative Plan:   Informed Consent: I have reviewed the patients History and Physical, chart, labs and discussed the procedure including the risks, benefits and alternatives for the proposed anesthesia with the patient or authorized representative who has indicated his/her understanding and acceptance.     Plan Discussed with: CRNA  Anesthesia Plan Comments:         Anesthesia Quick Evaluation

## 2017-04-15 NOTE — Anesthesia Procedure Notes (Signed)
Procedure Name: MAC Date/Time: 04/15/2017 10:02 AM Performed by: Hedda Slade Pre-anesthesia Checklist: Patient identified, Emergency Drugs available, Suction available and Patient being monitored Patient Re-evaluated:Patient Re-evaluated prior to inductionOxygen Delivery Method: Nasal cannula

## 2017-04-30 DIAGNOSIS — H2512 Age-related nuclear cataract, left eye: Secondary | ICD-10-CM | POA: Diagnosis not present

## 2017-05-11 ENCOUNTER — Encounter: Payer: Self-pay | Admitting: *Deleted

## 2017-05-12 NOTE — H&P (Signed)
See scanned note.

## 2017-05-13 ENCOUNTER — Ambulatory Visit
Admission: RE | Admit: 2017-05-13 | Discharge: 2017-05-13 | Disposition: A | Discharge status: Home / Self Care | Payer: Medicare HMO | Source: Ambulatory Visit | Attending: Ophthalmology | Admitting: Ophthalmology

## 2017-05-13 ENCOUNTER — Encounter: Payer: Self-pay | Admitting: *Deleted

## 2017-05-13 ENCOUNTER — Encounter
Admission: RE | Disposition: A | Discharge status: Home / Self Care | Payer: Self-pay | Source: Ambulatory Visit | Attending: Ophthalmology

## 2017-05-13 ENCOUNTER — Ambulatory Visit: Payer: Medicare HMO | Admitting: Anesthesiology

## 2017-05-13 DIAGNOSIS — H2512 Age-related nuclear cataract, left eye: Secondary | ICD-10-CM | POA: Insufficient documentation

## 2017-05-13 DIAGNOSIS — Z90710 Acquired absence of both cervix and uterus: Secondary | ICD-10-CM | POA: Diagnosis not present

## 2017-05-13 DIAGNOSIS — Z87891 Personal history of nicotine dependence: Secondary | ICD-10-CM | POA: Insufficient documentation

## 2017-05-13 DIAGNOSIS — Z880 Allergy status to penicillin: Secondary | ICD-10-CM | POA: Diagnosis not present

## 2017-05-13 DIAGNOSIS — R000 Tachycardia, unspecified: Secondary | ICD-10-CM | POA: Diagnosis not present

## 2017-05-13 HISTORY — PX: CATARACT EXTRACTION W/PHACO: SHX586

## 2017-05-13 SURGERY — PHACOEMULSIFICATION, CATARACT, WITH IOL INSERTION
Anesthesia: Monitor Anesthesia Care | Site: Eye | Laterality: Left | Wound class: Clean

## 2017-05-13 MED ORDER — POVIDONE-IODINE 5 % OP SOLN
OPHTHALMIC | Status: AC
  Filled 2017-05-13: qty 30

## 2017-05-13 MED ORDER — MIDAZOLAM HCL 2 MG/2ML IJ SOLN
INTRAMUSCULAR | Status: AC
  Filled 2017-05-13: qty 2

## 2017-05-13 MED ORDER — HYALURONIDASE HUMAN 150 UNIT/ML IJ SOLN
INTRAMUSCULAR | Status: AC
  Filled 2017-05-13: qty 1

## 2017-05-13 MED ORDER — CEFUROXIME OPHTHALMIC INJECTION 1 MG/0.1 ML
INJECTION | OPHTHALMIC | Status: AC
  Filled 2017-05-13: qty 0.1

## 2017-05-13 MED ORDER — EPINEPHRINE PF 1 MG/ML IJ SOLN
INTRAMUSCULAR | Status: DC | PRN
  Administered 2017-05-13: 11:00:00 via OPHTHALMIC

## 2017-05-13 MED ORDER — LIDOCAINE HCL (PF) 4 % IJ SOLN
INTRAOCULAR | Status: DC | PRN
  Administered 2017-05-13: 4 mL via OPHTHALMIC

## 2017-05-13 MED ORDER — NA CHONDROIT SULF-NA HYALURON 40-17 MG/ML IO SOLN
INTRAOCULAR | Status: DC | PRN
  Administered 2017-05-13: 1 mL via INTRAOCULAR

## 2017-05-13 MED ORDER — SODIUM CHLORIDE 0.9 % IV SOLN
INTRAVENOUS | Status: DC
Start: 2017-05-13 — End: 2017-05-13
  Administered 2017-05-13: 10:00:00 via INTRAVENOUS

## 2017-05-13 MED ORDER — PHENYLEPHRINE HCL 10 % OP SOLN
OPHTHALMIC | Status: AC
  Administered 2017-05-13: 1 [drp] via OPHTHALMIC
  Filled 2017-05-13: qty 5

## 2017-05-13 MED ORDER — MOXIFLOXACIN HCL 0.5 % OP SOLN
OPHTHALMIC | Status: AC
  Administered 2017-05-13: 1 [drp] via OPHTHALMIC
  Filled 2017-05-13: qty 3

## 2017-05-13 MED ORDER — NA CHONDROIT SULF-NA HYALURON 40-17 MG/ML IO SOLN
INTRAOCULAR | Status: AC
  Filled 2017-05-13: qty 1

## 2017-05-13 MED ORDER — ALFENTANIL 500 MCG/ML IJ INJ
INJECTION | INTRAVENOUS | Status: AC
  Filled 2017-05-13: qty 5

## 2017-05-13 MED ORDER — CYCLOPENTOLATE HCL 2 % OP SOLN
OPHTHALMIC | Status: AC
Start: 2017-05-13 — End: 2017-05-13
  Administered 2017-05-13: 1 [drp] via OPHTHALMIC
  Filled 2017-05-13: qty 2

## 2017-05-13 MED ORDER — ALFENTANIL 500 MCG/ML IJ INJ
INJECTION | INTRAVENOUS | Status: DC | PRN
  Administered 2017-05-13: 500 ug via INTRAVENOUS

## 2017-05-13 MED ORDER — CARBACHOL 0.01 % IO SOLN
INTRAOCULAR | Status: DC | PRN
  Administered 2017-05-13: 0.5 mL via INTRAOCULAR

## 2017-05-13 MED ORDER — MOXIFLOXACIN HCL 0.5 % OP SOLN
1.0000 [drp] | OPHTHALMIC | Status: AC
  Administered 2017-05-13 (×3): 1 [drp] via OPHTHALMIC

## 2017-05-13 MED ORDER — LIDOCAINE HCL (PF) 2 % IJ SOLN
INTRAMUSCULAR | Status: AC
  Filled 2017-05-13: qty 2

## 2017-05-13 MED ORDER — TETRACAINE HCL 0.5 % OP SOLN
OPHTHALMIC | Status: DC | PRN
  Administered 2017-05-13: 1 [drp] via OPHTHALMIC

## 2017-05-13 MED ORDER — EPINEPHRINE PF 1 MG/ML IJ SOLN
INTRAMUSCULAR | Status: AC
  Filled 2017-05-13: qty 2

## 2017-05-13 MED ORDER — TETRACAINE HCL 0.5 % OP SOLN
OPHTHALMIC | Status: AC
  Filled 2017-05-13: qty 2

## 2017-05-13 MED ORDER — BUPIVACAINE HCL (PF) 0.75 % IJ SOLN
INTRAMUSCULAR | Status: AC
  Filled 2017-05-13: qty 10

## 2017-05-13 MED ORDER — MOXIFLOXACIN HCL 0.5 % OP SOLN
OPHTHALMIC | Status: DC | PRN
  Administered 2017-05-13: 0.2 mL via OPHTHALMIC

## 2017-05-13 MED ORDER — LIDOCAINE HCL (PF) 4 % IJ SOLN
INTRAMUSCULAR | Status: DC | PRN
  Administered 2017-05-13: 11:00:00 via OPHTHALMIC

## 2017-05-13 MED ORDER — POVIDONE-IODINE 5 % OP SOLN
OPHTHALMIC | Status: DC | PRN
  Administered 2017-05-13: 1 via OPHTHALMIC

## 2017-05-13 MED ORDER — PHENYLEPHRINE HCL 10 % OP SOLN
1.0000 [drp] | OPHTHALMIC | Status: AC
  Administered 2017-05-13 (×4): 1 [drp] via OPHTHALMIC

## 2017-05-13 MED ORDER — MIDAZOLAM HCL 2 MG/2ML IJ SOLN
INTRAMUSCULAR | Status: DC | PRN
  Administered 2017-05-13: 1 mg via INTRAVENOUS

## 2017-05-13 MED ORDER — CYCLOPENTOLATE HCL 2 % OP SOLN
1.0000 [drp] | OPHTHALMIC | Status: AC
  Administered 2017-05-13 (×4): 1 [drp] via OPHTHALMIC

## 2017-05-13 SURGICAL SUPPLY — 23 items
CORD BIP STRL DISP 12FT (MISCELLANEOUS) ×3 IMPLANT
DRAPE XRAY CASSETTE 23X24 (DRAPES) ×3 IMPLANT
ERASER HMR WETFIELD 18G (MISCELLANEOUS) ×3 IMPLANT
GLOVE BIO SURGEON STRL SZ8 (GLOVE) ×3 IMPLANT
GLOVE SURG LX 6.5 MICRO (GLOVE) ×2
GLOVE SURG LX 8.0 MICRO (GLOVE) ×2
GLOVE SURG LX STRL 6.5 MICRO (GLOVE) ×1 IMPLANT
GLOVE SURG LX STRL 8.0 MICRO (GLOVE) ×1 IMPLANT
GOWN STRL REUS W/ TWL LRG LVL3 (GOWN DISPOSABLE) ×1 IMPLANT
GOWN STRL REUS W/ TWL XL LVL3 (GOWN DISPOSABLE) ×1 IMPLANT
GOWN STRL REUS W/TWL LRG LVL3 (GOWN DISPOSABLE) ×2
GOWN STRL REUS W/TWL XL LVL3 (GOWN DISPOSABLE) ×2
LENS IOL ACRYSOF IQ 20.5 (Intraocular Lens) ×3 IMPLANT
PACK CATARACT (MISCELLANEOUS) ×3 IMPLANT
PACK CATARACT DINGLEDEIN LX (MISCELLANEOUS) ×3 IMPLANT
PACK EYE AFTER SURG (MISCELLANEOUS) ×3 IMPLANT
SHLD EYE VISITEC  UNIV (MISCELLANEOUS) ×3 IMPLANT
SOL BSS BAG (MISCELLANEOUS) ×3
SOLUTION BSS BAG (MISCELLANEOUS) ×1 IMPLANT
SUT SILK 5-0 (SUTURE) ×3 IMPLANT
SYR 5ML LL (SYRINGE) ×3 IMPLANT
WATER STERILE IRR 250ML POUR (IV SOLUTION) ×3 IMPLANT
WIPE NON LINTING 3.25X3.25 (MISCELLANEOUS) ×3 IMPLANT

## 2017-05-13 NOTE — Interval H&P Note (Signed)
History and Physical Interval Note:  05/13/2017 10:26 AM  Traci Lynn  has presented today for surgery, with the diagnosis of NUCLEAR SCLEROTIC CATARACT LEFT EYE  The various methods of treatment have been discussed with the patient and family. After consideration of risks, benefits and other options for treatment, the patient has consented to  Procedure(s) with comments: CATARACT EXTRACTION PHACO AND INTRAOCULAR LENS PLACEMENT (IOC) (Left) - [place] AP% CDE Fluid Pack lot # [id number] as a surgical intervention .  The patient's history has been reviewed, patient examined, no change in status, stable for surgery.  I have reviewed the patient's chart and labs.  Questions were answered to the patient's satisfaction.     [name]

## 2017-05-13 NOTE — Anesthesia Preprocedure Evaluation (Signed)
Anesthesia Evaluation  Patient identified by MRN, date of birth, ID band Patient awake    Reviewed: Allergy & Precautions, NPO status , Patient's Chart, lab work & pertinent test results  Airway Mallampati: II       Dental  (+) Teeth Intact   Pulmonary neg pulmonary ROS, former smoker,    breath sounds clear to auscultation       Cardiovascular Exercise Tolerance: Good + dysrhythmias  Rhythm:Regular     Neuro/Psych    GI/Hepatic negative GI ROS, Neg liver ROS,   Endo/Other  negative endocrine ROS  Renal/GU   negative genitourinary   Musculoskeletal   Abdominal Normal abdominal exam  (+)   Peds negative pediatric ROS (+)  Hematology negative hematology ROS (+)   Anesthesia Other Findings   Reproductive/Obstetrics                             Anesthesia Physical Anesthesia Plan  ASA: II  Anesthesia Plan: MAC   Post-op Pain Management:    Induction: Intravenous  PONV Risk Score and Plan: 2 and Ondansetron, Dexamethasone and Treatment may vary due to age  Airway Management Planned: Natural Airway  Additional Equipment:   Intra-op Plan:   Post-operative Plan:   Informed Consent: I have reviewed the patients History and Physical, chart, labs and discussed the procedure including the risks, benefits and alternatives for the proposed anesthesia with the patient or authorized representative who has indicated his/her understanding and acceptance.     Plan Discussed with: CRNA  Anesthesia Plan Comments:         Anesthesia Quick Evaluation

## 2017-05-13 NOTE — Anesthesia Post-op Follow-up Note (Cosign Needed)
Anesthesia QCDR form completed.        

## 2017-05-13 NOTE — Discharge Instructions (Addendum)
Eye Surgery Discharge Instructions  Expect mild scratchy sensation or mild soreness. DO NOT RUB YOUR EYE!  The day of surgery:  Minimal physical activity, but bed rest is not required  No reading, computer work, or close hand work  No bending, lifting, or straining.  May watch TV  For 24 hours:  No driving, legal decisions, or alcoholic beverages  Safety precautions  Eat anything you prefer: It is better to start with liquids, then soup then solid foods.  _____ Eye patch should be worn until postoperative exam tomorrow.  ____ Solar shield eyeglasses should be worn for comfort in the sunlight/patch while sleeping  Resume all regular medications including aspirin or Coumadin if these were discontinued prior to surgery. You may shower, bathe, shave, or wash your hair. Tylenol may be taken for mild discomfort.  Call your doctor if you experience significant pain, nausea, or vomiting, fever > 101 or other signs of infection. [phone] or [phone] Specific instructions:  Follow-up Information    Traci Lynn, Traci Lipps, MD Follow up.   Specialty:  Ophthalmology Why:  Thursday 05/14/17 @ 8:20 am Contact information: [address]   [place] [address] [phone]

## 2017-05-13 NOTE — Op Note (Signed)
Date of Surgery: 05/13/2017 Date of Dictation: 05/13/2017 11:12 AM Pre-operative Diagnosis:  Nuclear Sclerotic Cataract left Eye Post-operative Diagnosis: same Procedure performed: Extra-capsular Cataract Extraction (ECCE) with placement of a posterior chamber intraocular lens (IOL) left Eye IOL:  Implant Name Type Inv. Item Serial No. Manufacturer Lot No. LRB No. Used  LENS IOL ACRYSOF IQ 20.5 - [id number] Intraocular Lens LENS IOL ACRYSOF IQ 20.5 [id number] ALCON   Left 1   Anesthesia: 2% Lidocaine and 4% Marcaine in a 50/50 mixture with 10 unites/ml of Hylenex given as a peribulbar Anesthesiologist: Anesthesiologist: [place] [facility], Jane Canary, MD CRNA: Aline Brochure, CRNA Complications: none Estimated Blood Loss: less than 1 ml  Description of procedure:  The patient was given anesthesia and sedation via intravenous access. The patient was then prepped and draped in the usual fashion. A 25-gauge needle was bent for initiating the capsulorhexis. A 5-0 silk suture was placed through the conjunctiva superior and inferiorly to serve as bridle sutures. Hemostasis was obtained at the superior limbus using an eraser cautery. A partial thickness groove was made at the anterior surgical limbus with a 64 Beaver blade and this was dissected anteriorly with an Avaya. The anterior chamber was entered at 10 o'clock with a 1.0 mm paracentesis knife and through the lamellar dissection with a 2.6 mm Alcon keratome. Epi-Shugarcaine 0.5 CC [9 cc BSS Plus (Alcon), 3 cc 4% preservative-free lidocaine (Hospira) and 4 cc 1:1000 preservative-free, bisulfite-free epinephrine] was injected into the anterior chamber via the paracentesis tract. Epi-Shugarcaine 0.5 CC [9 cc BSS Plus (Alcon), 3 cc 4% preservative-free lidocaine (Hospira) and 4 cc 1:1000 preservative-free, bisulfite-free epinephrine] was injected into the anterior chamber via the paracentesis tract. DiscoVisc was injected to replace the  aqueous and a continuous tear curvilinear capsulorhexis was performed using a bent 25-gauge needle.  Balance salt on a syringe was used to perform hydro-dissection and phacoemulsification was carried out using a divide and conquer technique. Procedure(s) with comments: CATARACT EXTRACTION PHACO AND INTRAOCULAR LENS PLACEMENT (IOC) (Left) - [place] 1:07.6 AP% 22.9 CDE 31.29 Fluid Pack lot # [id number]. Irrigation/aspiration was used to remove the residual cortex and the capsular bag was inflated with DiscoVisc. The intraocular lens was inserted into the capsular bag using a pre-loaded UltraSert Delivery System. Irrigation/aspiration was used to remove the residual DiscoVisc. The wound was inflated with balanced salt and checked for leaks. None were found. Miostat was injected via the paracentesis track and 0.1 ml of Vigamox containing 1 mg of drug  was injected via the paracentesis track. The wound was checked for leaks again and none were found.   The bridal sutures were removed and two drops of Vigamox were placed on the eye. An eye shield was placed to protect the eye and the patient was discharged to the recovery area in good condition.   Aarilyn Dye MD

## 2017-05-13 NOTE — Anesthesia Postprocedure Evaluation (Signed)
Anesthesia Post Note  Patient: Traci Lynn  Procedure(s) Performed: Procedure(s) (LRB): CATARACT EXTRACTION PHACO AND INTRAOCULAR LENS PLACEMENT (IOC) (Left)  Patient location during evaluation: Short Stay Anesthesia Type: MAC Level of consciousness: awake and alert Pain management: pain level controlled Vital Signs Assessment: post-procedure vital signs reviewed and stable Respiratory status: spontaneous breathing, nonlabored ventilation, respiratory function stable and patient connected to nasal cannula oxygen Cardiovascular status: stable and blood pressure returned to baseline Anesthetic complications: no     Last Vitals:  Vitals:   05/13/17 1100 05/13/17 1115  BP: 135/66 135/66  Pulse: 61 (!) 54  Resp: 18 12  Temp: 36.6 C 36.6 C    Last Pain:  Vitals:   05/13/17 1115  TempSrc: Oral                 Estill Batten

## 2017-05-13 NOTE — Transfer of Care (Signed)
Immediate Anesthesia Transfer of Care Note  Patient: Traci Lynn  Procedure(s) Performed: Procedure(s) with comments: CATARACT EXTRACTION PHACO AND INTRAOCULAR LENS PLACEMENT (IOC) (Left) - [place] 1:07.6 AP% 22.9 CDE 31.29 Fluid Pack lot # [id number]  Patient Location: Short Stay  Anesthesia Type:MAC  Level of Consciousness: awake, alert  and oriented  Airway & Oxygen Therapy: Patient Spontanous Breathing  Post-op Assessment: Post -op Vital signs reviewed and stable  Post vital signs: stable  Last Vitals:  Vitals:   05/13/17 0928 05/13/17 1115  BP: 123/71 135/66  Pulse: 63 (!) 54  Resp: 18 12  Temp: 36.8 C 36.6 C    Last Pain:  Vitals:   05/13/17 1115  TempSrc: Oral         Complications: No apparent anesthesia complications

## 2017-05-31 ENCOUNTER — Ambulatory Visit
Admission: EM | Admit: 2017-05-31 | Discharge: 2017-05-31 | Disposition: A | Discharge status: Home / Self Care | Payer: Medicare HMO | Attending: Emergency Medicine | Admitting: Emergency Medicine

## 2017-05-31 DIAGNOSIS — M5442 Lumbago with sciatica, left side: Secondary | ICD-10-CM

## 2017-05-31 MED ORDER — MELOXICAM 15 MG PO TABS: 15.0000 mg | ORAL_TABLET | Freq: Every day | ORAL | 0 refills | Status: DC | PRN

## 2017-05-31 MED ORDER — CYCLOBENZAPRINE HCL 10 MG PO TABS: 10.0000 mg | ORAL_TABLET | Freq: Two times a day (BID) | ORAL | 0 refills | Status: DC | PRN

## 2017-05-31 NOTE — Discharge Instructions (Signed)
Take medication as prescribed. Rest. Drink plenty of fluids. Stretch.  ° °Follow up with your primary care physician this week as needed. Return to Urgent care for new or worsening concerns.  ° °

## 2017-05-31 NOTE — ED Provider Notes (Signed)
[facility] ____________________________________________  Time seen: Approximately 5:06 PM  I have reviewed the triage vital signs and the nursing notes.   HISTORY  Chief Complaint Back Pain  HPI Traci Lynn is a 66 y.o. female  present for evaluation of left lower back pain that is been present for the last 4 days. Reports left lower back pain stays in that same area with intermittent radiation down posterior left leg to the knee, not any further. States if she is lying still or sitting completely still in a comfortable position she denies any pain then. Patient reports pain is only with active range of motion and movements. States pain is currently mild to low pain, without current radiation. Denies any paresthesias, urinary or bowel retention or incontinence, fevers, fall, injury or trauma. Reports has a history of similar pain with sciatica in the past. States that she believes she's been told that she has 3 bulging disc, unsure if that was in her neck or in her low back. Reports has continued to remain active. Denies any accompanying fevers. Denies recent sickness or known trigger. Reports has taken some over-the-counter Tylenol with some improvement as well as heat has helped some. Patient reports approximately month ago she was at the beach and states when she was getting in the truck she felt like she may have pulled her low back and had pain for a day or 2 that been fully resolved and then returned this past week. Again denies any fall or direct injury.  Denies chest pain, shortness of breath, abdominal pain, dysuria, or rash. Denies recent sickness. Denies recent antibiotic use.    Past Medical History:  Diagnosis Date  . Dysrhythmia   . Lumbago   . Paroxysmal atrial tachycardia ([place])   . Tubular adenoma of colon     Patient Active Problem List   Diagnosis Date Noted  . Left arm pain 01/20/2014    Past Surgical History:  Procedure Laterality Date  .  ABDOMINAL HYSTERECTOMY    . CARPAL TUNNEL RELEASE    . CATARACT EXTRACTION W/PHACO Right 04/15/2017   Procedure: CATARACT EXTRACTION PHACO AND INTRAOCULAR LENS PLACEMENT (IOC);  Surgeon: Estill Cotta, MD;  Location: [facility] ORS;  Service: Ophthalmology;  Laterality: Right;  Lot# [id number] H [place]: 01:02.8 AP%: 19.7 CDE: 25.43  . CATARACT EXTRACTION W/PHACO Left 05/13/2017   Procedure: CATARACT EXTRACTION PHACO AND INTRAOCULAR LENS PLACEMENT (IOC);  Surgeon: Estill Cotta, MD;  Location: [facility] ORS;  Service: Ophthalmology;  Laterality: Left;  [place] 1:07.6 AP% 22.9 CDE 31.29 Fluid Pack lot # [id number]  . TMJ ARTHROPLASTY    . TONSILLECTOMY AND ADENOIDECTOMY       No current facility-administered medications for this encounter.   Current Outpatient Prescriptions:  .  acetaminophen (TYLENOL) 500 MG tablet, Take 1 tablet (500 mg total) by mouth every 6 (six) hours as needed for mild pain, moderate pain, fever or headache. (Patient taking differently: Take 1,000 mg by mouth every 6 (six) hours as needed for mild pain, moderate pain, fever or headache. ), Disp: 30 tablet, Rfl: 0 .  albuterol (PROVENTIL HFA;VENTOLIN HFA) 108 (90 Base) MCG/ACT inhaler, Inhale 1-2 puffs into the lungs every 6 (six) hours as needed for wheezing or shortness of breath., Disp: , Rfl:  .  Ascorbic Acid (VITAMIN C PO), Take 1 tablet by mouth daily., Disp: , Rfl:  .  Calcium Carbonate-Vitamin D (CALCIUM 600+D PO), Take 1 tablet by mouth daily., Disp: , Rfl:  .  cetirizine (ZYRTEC) 10 MG  tablet, Take 10 mg by mouth daily., Disp: , Rfl:  .  Cholecalciferol (VITAMIN D3) 5000 units CAPS, Take 5,000 Units by mouth daily., Disp: , Rfl:  .  Cyanocobalamin (VITAMIN B-12 PO), Take 1 tablet by mouth daily., Disp: , Rfl:  .  cyclobenzaprine (FLEXERIL) 10 MG tablet, Take 1 tablet (10 mg total) by mouth 2 (two) times daily as needed for muscle spasms. Do not drive while taking as can cause drowsiness, Disp: 15 tablet, Rfl: 0 .  EPINEPHrine  (EPI-PEN) 0.3 mg/0.3 mL SOAJ injection, Inject 0.3 mg into the muscle as needed (allergic reaction). , Disp: , Rfl:  .  meloxicam (MOBIC) 15 MG tablet, Take 1 tablet (15 mg total) by mouth daily as needed., Disp: 10 tablet, Rfl: 0 .  metoprolol tartrate (LOPRESSOR) 25 MG tablet, Take 50 mg by mouth at bedtime., Disp: , Rfl:  .  Multiple Vitamins-Minerals (CENTRUM SILVER PO), Take 1 tablet by mouth daily., Disp: , Rfl:  .  VITAMIN E PO, Take 1 capsule by mouth daily., Disp: , Rfl:   Allergies Pork-derived products; Penicillin v potassium; and Shellfish allergy  Family History  Problem Relation Age of Onset  . Kidney disease Father        Deceased, 20  . Colon cancer Mother        Deceased 6  . Healthy Son     Social History Social History  Substance Use Topics  . Smoking status: Former Smoker    Types: Cigarettes    Quit date: 06/01/1971  . Smokeless tobacco: Never Used  . Alcohol use No    Review of Systems Constitutional: No fever/chills Cardiovascular: Denies chest pain. Respiratory: Denies shortness of breath. Gastrointestinal: No abdominal pain.   Genitourinary: Negative for dysuria. Musculoskeletal: Positive for back pain. Skin: Negative for rash.  ____________________________________________   PHYSICAL EXAM:  VITAL SIGNS: ED Triage Vitals  Enc Vitals Group     BP 05/31/17 1429 (!) 148/80     Pulse Rate 05/31/17 1429 91     Resp 05/31/17 1429 16     Temp 05/31/17 1429 98.3 F (36.8 C)     Temp Source 05/31/17 1429 Oral     SpO2 05/31/17 1429 100 %     Weight 05/31/17 1429 172 lb (78 kg)     Height 05/31/17 1429 5\' 4"  (1.626 m)     Head Circumference --      Peak Flow --      Pain Score 05/31/17 1519 5     Pain Loc --      Pain Edu? --      Excl. in [place]? --     Constitutional: Alert and oriented. Well appearing and in no acute distress.. Cardiovascular: Normal rate, regular rhythm. Grossly normal heart sounds.  Good peripheral  circulation. Respiratory: Normal respiratory effort without tachypnea nor retractions. Breath sounds are clear and equal bilaterally. No wheezes, rales, rhonchi. Gastrointestinal: Soft and nontender.  No CVA tenderness. Musculoskeletal:  Nontender with normal range of motion in all extremities. No midline cervical, thoracic or lumbar tenderness to palpation. Bilateral pedal pulses equal and easily palpated.      Right lower leg:  No tenderness or edema.      Left lower leg:  No tenderness or edema.  Except: Mild to moderate tenderness in left greater sciatic notch area, no midline tenderness, no erythema, no skin changes, no saddle anesthesia. Changes positions from sitting to standing to ambulating quickly without distress, bilateral plantar flexion and dorsiflexion  strong and equal, bilateral standing knee lifts full range of motion and nontender. Neurologic:  Normal speech and language. No gross focal neurologic deficits are appreciated. Speech is normal. No gait instability.  Skin:  Skin is warm, dry and intact. No rash noted. Psychiatric: Mood and affect are normal. Speech and behavior are normal. Patient exhibits appropriate insight and judgment   ___________________________________________   LABS (all labs ordered are listed, but only abnormal results are displayed)  Labs Reviewed - No data to display ____________________________________________  PROCEDURES Procedures    INITIAL IMPRESSION / ASSESSMENT AND PLAN / ED COURSE  Pertinent labs & imaging results that were available during my care of the patient were reviewed by me and considered in my medical decision making (see chart for details).  Well-appearing patient. No acute distress. Presents for evaluation left lower back pain with intermittent radiation down left leg. Denies trauma. History of similar previous sciatica. No midline tenderness. No focal neurological deficits. Denies fevers. Suspect lumbar strain with left-sided  sciatica. Will treat patient with oral daily Mobic and when necessary Flexeril. Patient denies need for additional pain medication. Encouraged ice, heat, stretching and supportive care. Follow-up with primary care physician as needed. Discussed indication, risks and benefits of medications with patient.  Discussed follow up with Primary care physician this week. Discussed follow up and return parameters including no resolution or any worsening concerns. Patient verbalized understanding and agreed to plan.   ____________________________________________   FINAL CLINICAL IMPRESSION(S) / ED DIAGNOSES  Final diagnoses:  Acute left-sided low back pain with left-sided sciatica     Discharge Medication List as of 05/31/2017  3:14 PM    START taking these medications   Details  cyclobenzaprine (FLEXERIL) 10 MG tablet Take 1 tablet (10 mg total) by mouth 2 (two) times daily as needed for muscle spasms. Do not drive while taking as can cause drowsiness, Starting Sun 05/31/2017, Normal    meloxicam (MOBIC) 15 MG tablet Take 1 tablet (15 mg total) by mouth daily as needed., Starting Sun 05/31/2017, Normal        Note: This dictation was prepared with Dragon dictation along with smaller phrase technology. Any transcriptional errors that result from this process are unintentional.         Marylene Land, NP 05/31/17 1731

## 2017-05-31 NOTE — ED Triage Notes (Signed)
Pt with low back pain and radiating down left leg. Pain since Wednesday. Has had sciatica in the past. Pain 5/10 but increases to 8/10 with some movement.

## 2017-06-24 DIAGNOSIS — M79641 Pain in right hand: Secondary | ICD-10-CM | POA: Diagnosis not present

## 2017-06-26 ENCOUNTER — Other Ambulatory Visit: Payer: Self-pay | Admitting: Gastroenterology

## 2017-06-26 DIAGNOSIS — R1314 Dysphagia, pharyngoesophageal phase: Secondary | ICD-10-CM | POA: Diagnosis not present

## 2017-06-26 DIAGNOSIS — R1013 Epigastric pain: Secondary | ICD-10-CM | POA: Diagnosis not present

## 2017-06-26 DIAGNOSIS — Z86010 Personal history of colonic polyps: Secondary | ICD-10-CM | POA: Diagnosis not present

## 2017-07-01 ENCOUNTER — Ambulatory Visit
Admission: RE | Admit: 2017-07-01 | Discharge: 2017-07-01 | Disposition: A | Discharge status: Home / Self Care | Payer: Medicare HMO | Source: Ambulatory Visit | Attending: Gastroenterology | Admitting: Gastroenterology

## 2017-07-01 DIAGNOSIS — R933 Abnormal findings on diagnostic imaging of other parts of digestive tract: Secondary | ICD-10-CM | POA: Insufficient documentation

## 2017-07-01 DIAGNOSIS — R1312 Dysphagia, oropharyngeal phase: Secondary | ICD-10-CM | POA: Diagnosis not present

## 2017-07-01 DIAGNOSIS — R1314 Dysphagia, pharyngoesophageal phase: Secondary | ICD-10-CM | POA: Diagnosis not present

## 2017-08-25 DIAGNOSIS — Z0000 Encounter for general adult medical examination without abnormal findings: Secondary | ICD-10-CM | POA: Diagnosis not present

## 2017-08-25 DIAGNOSIS — Z136 Encounter for screening for cardiovascular disorders: Secondary | ICD-10-CM | POA: Diagnosis not present

## 2017-09-02 ENCOUNTER — Other Ambulatory Visit: Payer: Self-pay | Admitting: Family Medicine

## 2017-09-02 DIAGNOSIS — Z0000 Encounter for general adult medical examination without abnormal findings: Secondary | ICD-10-CM | POA: Diagnosis not present

## 2017-09-02 DIAGNOSIS — Z1231 Encounter for screening mammogram for malignant neoplasm of breast: Secondary | ICD-10-CM

## 2017-09-11 ENCOUNTER — Ambulatory Visit
Admission: RE | Admit: 2017-09-11 | Discharge: 2017-09-11 | Disposition: A | Discharge status: Home / Self Care | Payer: Medicare HMO | Source: Ambulatory Visit | Attending: Family Medicine | Admitting: Family Medicine

## 2017-09-11 DIAGNOSIS — Z1231 Encounter for screening mammogram for malignant neoplasm of breast: Secondary | ICD-10-CM | POA: Insufficient documentation

## 2017-10-09 ENCOUNTER — Encounter
Admission: RE | Disposition: A | Discharge status: Home / Self Care | Payer: Self-pay | Source: Ambulatory Visit | Attending: Gastroenterology

## 2017-10-09 ENCOUNTER — Ambulatory Visit: Payer: Medicare HMO | Admitting: Anesthesiology

## 2017-10-09 ENCOUNTER — Ambulatory Visit
Admission: RE | Admit: 2017-10-09 | Discharge: 2017-10-09 | Disposition: A | Discharge status: Home / Self Care | Payer: Medicare HMO | Source: Ambulatory Visit | Attending: Gastroenterology | Admitting: Gastroenterology

## 2017-10-09 ENCOUNTER — Encounter: Payer: Self-pay | Admitting: Anesthesiology

## 2017-10-09 DIAGNOSIS — K5790 Diverticulosis of intestine, part unspecified, without perforation or abscess without bleeding: Secondary | ICD-10-CM | POA: Diagnosis not present

## 2017-10-09 DIAGNOSIS — Z791 Long term (current) use of non-steroidal anti-inflammatories (NSAID): Secondary | ICD-10-CM | POA: Diagnosis not present

## 2017-10-09 DIAGNOSIS — K269 Duodenal ulcer, unspecified as acute or chronic, without hemorrhage or perforation: Secondary | ICD-10-CM | POA: Insufficient documentation

## 2017-10-09 DIAGNOSIS — K2210 Ulcer of esophagus without bleeding: Secondary | ICD-10-CM | POA: Diagnosis not present

## 2017-10-09 DIAGNOSIS — K2960 Other gastritis without bleeding: Secondary | ICD-10-CM | POA: Diagnosis not present

## 2017-10-09 DIAGNOSIS — Z87891 Personal history of nicotine dependence: Secondary | ICD-10-CM | POA: Diagnosis not present

## 2017-10-09 DIAGNOSIS — Z79899 Other long term (current) drug therapy: Secondary | ICD-10-CM | POA: Diagnosis not present

## 2017-10-09 DIAGNOSIS — K5730 Diverticulosis of large intestine without perforation or abscess without bleeding: Secondary | ICD-10-CM | POA: Diagnosis not present

## 2017-10-09 DIAGNOSIS — K259 Gastric ulcer, unspecified as acute or chronic, without hemorrhage or perforation: Secondary | ICD-10-CM | POA: Insufficient documentation

## 2017-10-09 DIAGNOSIS — K208 Other esophagitis: Secondary | ICD-10-CM | POA: Diagnosis not present

## 2017-10-09 DIAGNOSIS — K2950 Unspecified chronic gastritis without bleeding: Secondary | ICD-10-CM | POA: Diagnosis not present

## 2017-10-09 DIAGNOSIS — Z1211 Encounter for screening for malignant neoplasm of colon: Secondary | ICD-10-CM | POA: Diagnosis not present

## 2017-10-09 DIAGNOSIS — Z86010 Personal history of colonic polyps: Secondary | ICD-10-CM | POA: Insufficient documentation

## 2017-10-09 DIAGNOSIS — K222 Esophageal obstruction: Secondary | ICD-10-CM | POA: Insufficient documentation

## 2017-10-09 DIAGNOSIS — K449 Diaphragmatic hernia without obstruction or gangrene: Secondary | ICD-10-CM | POA: Diagnosis not present

## 2017-10-09 DIAGNOSIS — R1013 Epigastric pain: Secondary | ICD-10-CM | POA: Diagnosis not present

## 2017-10-09 DIAGNOSIS — R1310 Dysphagia, unspecified: Secondary | ICD-10-CM | POA: Diagnosis not present

## 2017-10-09 DIAGNOSIS — K224 Dyskinesia of esophagus: Secondary | ICD-10-CM | POA: Diagnosis not present

## 2017-10-09 DIAGNOSIS — K210 Gastro-esophageal reflux disease with esophagitis: Secondary | ICD-10-CM | POA: Insufficient documentation

## 2017-10-09 DIAGNOSIS — K2980 Duodenitis without bleeding: Secondary | ICD-10-CM | POA: Diagnosis not present

## 2017-10-09 HISTORY — PX: COLONOSCOPY WITH PROPOFOL: SHX5780

## 2017-10-09 HISTORY — PX: ESOPHAGOGASTRODUODENOSCOPY (EGD) WITH PROPOFOL: SHX5813

## 2017-10-09 SURGERY — ESOPHAGOGASTRODUODENOSCOPY (EGD) WITH PROPOFOL
Anesthesia: General

## 2017-10-09 MED ORDER — PROPOFOL 500 MG/50ML IV EMUL
INTRAVENOUS | Status: DC | PRN
  Administered 2017-10-09: 140 ug/kg/min via INTRAVENOUS

## 2017-10-09 MED ORDER — SODIUM CHLORIDE 0.9 % IV SOLN: INTRAVENOUS | Status: DC

## 2017-10-09 MED ORDER — FENTANYL CITRATE (PF) 100 MCG/2ML IJ SOLN
INTRAMUSCULAR | Status: AC
  Filled 2017-10-09: qty 2

## 2017-10-09 MED ORDER — PROPOFOL 500 MG/50ML IV EMUL
INTRAVENOUS | Status: AC
  Filled 2017-10-09: qty 50

## 2017-10-09 MED ORDER — SODIUM CHLORIDE 0.9 % IV SOLN
INTRAVENOUS | Status: DC
  Administered 2017-10-09: 1000 mL via INTRAVENOUS

## 2017-10-09 MED ORDER — PROPOFOL 10 MG/ML IV BOLUS
INTRAVENOUS | Status: DC | PRN
  Administered 2017-10-09: 70 mg via INTRAVENOUS

## 2017-10-09 MED ORDER — LIDOCAINE HCL (CARDIAC) 20 MG/ML IV SOLN
INTRAVENOUS | Status: DC | PRN
  Administered 2017-10-09: 100 mg via INTRAVENOUS

## 2017-10-09 NOTE — H&P (Signed)
Outpatient short stay form Pre-procedure 10/09/2017 12:54 PM Lollie Sails MD  Primary Physician:   Reason for visit:   History of present illness:      Current Facility-Administered Medications:  .  0.9 %  sodium chloride infusion, , Intravenous, Continuous, Lollie Sails, MD  Prescriptions Prior to Admission  Medication Sig Dispense Refill Last Dose  . acetaminophen (TYLENOL) 500 MG tablet Take 1 tablet (500 mg total) by mouth every 6 (six) hours as needed for mild pain, moderate pain, fever or headache. (Patient taking differently: Take 1,000 mg by mouth every 6 (six) hours as needed for mild pain, moderate pain, fever or headache. ) 30 tablet 0 05/06/2017  . albuterol (PROVENTIL HFA;VENTOLIN HFA) 108 (90 Base) MCG/ACT inhaler Inhale 1-2 puffs into the lungs every 6 (six) hours as needed for wheezing or shortness of breath.   Past Month at Unknown time  . Ascorbic Acid (VITAMIN C PO) Take 1 tablet by mouth daily.   05/12/2017 at Unknown time  . Calcium Carbonate-Vitamin D (CALCIUM 600+D PO) Take 1 tablet by mouth daily.   05/12/2017 at Unknown time  . cetirizine (ZYRTEC) 10 MG tablet Take 10 mg by mouth daily.   05/12/2017 at am  . Cholecalciferol (VITAMIN D3) 5000 units CAPS Take 5,000 Units by mouth daily.   05/12/2017 at Unknown time  . Cyanocobalamin (VITAMIN B-12 PO) Take 1 tablet by mouth daily.   05/12/2017 at Unknown time  . cyclobenzaprine (FLEXERIL) 10 MG tablet Take 1 tablet (10 mg total) by mouth 2 (two) times daily as needed for muscle spasms. Do not drive while taking as can cause drowsiness 15 tablet 0   . EPINEPHrine (EPI-PEN) 0.3 mg/0.3 mL SOAJ injection Inject 0.3 mg into the muscle as needed (allergic reaction).    Not Taking at Unknown time  . meloxicam (MOBIC) 15 MG tablet Take 1 tablet (15 mg total) by mouth daily as needed. 10 tablet 0   . metoprolol tartrate (LOPRESSOR) 25 MG tablet Take 50 mg by mouth at bedtime.   05/12/2017 at [place]  . Multiple Vitamins-Minerals  (CENTRUM SILVER PO) Take 1 tablet by mouth daily.   05/12/2017 at Unknown time  . VITAMIN E PO Take 1 capsule by mouth daily.   05/12/2017 at Unknown time     Allergies  Allergen Reactions  . Pork-Derived Products Anaphylaxis  . Penicillin V Potassium Hives and Rash    Has patient had a PCN reaction causing immediate rash, facial/tongue/throat swelling, SOB or lightheadedness with hypotension: Yes Has patient had a PCN reaction causing severe rash involving mucus membranes or skin necrosis: Yes Has patient had a PCN reaction that required hospitalization: No Has patient had a PCN reaction occurring within the last 10 years: No If all of the above answers are "NO", then may proceed with Cephalosporin use.   . Shellfish Allergy Hives and Swelling     Past Medical History:  Diagnosis Date  . Dysrhythmia   . Lumbago   . Paroxysmal atrial tachycardia ([place])   . Tubular adenoma of colon     Review of systems:      Physical Exam    Heart and lungs:     HEENT:     Other:     Pertinant exam for procedure:     Planned proceedures:     Lollie Sails, MD Gastroenterology 10/09/2017  12:54 PM

## 2017-10-09 NOTE — Anesthesia Preprocedure Evaluation (Signed)
Anesthesia Evaluation  Patient identified by MRN, date of birth, ID band Patient awake    Reviewed: Allergy & Precautions, NPO status , Patient's Chart, lab work & pertinent test results  Airway Mallampati: II  TM Distance: <3 FB Neck ROM: limited    Dental  (+) Poor Dentition, Chipped   Pulmonary neg shortness of breath, former smoker,    breath sounds clear to auscultation       Cardiovascular Exercise Tolerance: Good (-) angina(-) Past MI + dysrhythmias  Rhythm:Regular     Neuro/Psych    GI/Hepatic negative GI ROS, Neg liver ROS, neg GERD  ,  Endo/Other  negative endocrine ROS  Renal/GU   negative genitourinary   Musculoskeletal   Abdominal Normal abdominal exam  (+)   Peds negative pediatric ROS (+)  Hematology negative hematology ROS (+)   Anesthesia Other Findings Past Medical History: No date: Dysrhythmia No date: Lumbago No date: Paroxysmal atrial tachycardia ([facility]) No date: Tubular adenoma of colon   Reproductive/Obstetrics                             Anesthesia Physical  Anesthesia Plan  ASA: III  Anesthesia Plan: General   Post-op Pain Management:    Induction: Intravenous  PONV Risk Score and Plan: Propofol infusion  Airway Management Planned: Natural Airway and Nasal Cannula  Additional Equipment:   Intra-op Plan:   Post-operative Plan:   Informed Consent: I have reviewed the patients History and Physical, chart, labs and discussed the procedure including the risks, benefits and alternatives for the proposed anesthesia with the patient or authorized representative who has indicated his/her understanding and acceptance.   Dental Advisory Given  Plan Discussed with: Anesthesiologist, CRNA and Surgeon  Anesthesia Plan Comments: (Patient consented for risks of anesthesia including but not limited to:  - adverse reactions to medications - risk of intubation if  required - damage to teeth, lips or other oral mucosa - sore throat or hoarseness - Damage to heart, brain, lungs or loss of life  Patient voiced understanding.)        Anesthesia Quick Evaluation

## 2017-10-09 NOTE — Anesthesia Procedure Notes (Signed)
Date/Time: 10/09/2017 1:44 PM Performed by: Nelda Marseille Pre-anesthesia Checklist: Patient identified, Emergency Drugs available, Suction available, Patient being monitored and Timeout performed Oxygen Delivery Method: Nasal cannula

## 2017-10-09 NOTE — Op Note (Signed)
[facility] Gastroenterology Patient Name: Traci Lynn Procedure Date: 10/09/2017 1:35 PM MRN: [id number] Account #: [id number] Date of Birth: 1951/11/14 Admit Type: Outpatient Age: 66 Room: [facility] ENDO ROOM 1 Gender: Female Note Status: Finalized Procedure:            Upper GI endoscopy Indications:          Dyspepsia, Dysphagia Providers:            Lollie Sails, MD Referring MD:         Dion Body (Referring MD) Medicines:            Monitored Anesthesia Care Complications:        No immediate complications. Procedure:            Pre-Anesthesia Assessment:                       - ASA Grade Assessment: III - A patient with severe                        systemic disease.                       After obtaining informed consent, the endoscope was                        passed under direct vision. Throughout the procedure,                        the patient's blood pressure, pulse, and oxygen                        saturations were monitored continuously. The Endoscope                        was introduced through the mouth, and advanced to the                        third part of duodenum. The upper GI endoscopy was                        accomplished without difficulty. The patient tolerated                        the procedure well. Findings:      LA Grade B (one or more mucosal breaks greater than 5 mm, not extending       between the tops of two mucosal folds) esophagitis with no bleeding was       found. Biopsies were taken with a cold forceps for histology.      Abnormal motility was noted in the middle third of the esophagus and in       the lower third of the esophagus. The cricopharyngeus was normal. There       is feline appearance of the mid and lower esophageal body. There is an       intermittant appearance of a Schatzki ring at the GE junction, however       this completely relaxes and is very widely patent. The distal   esophagus/lower esophageal sphincter is open.      A small hiatal hernia was present.      One non-bleeding superficial gastric ulcer was found  in the prepyloric       region of the stomach. The lesion was 4 mm in largest dimension.      The exam of the stomach was otherwise normal.      The cardia and gastric fundus were normal on retroflexion.      One non-bleeding linear duodenal ulcer with no stigmata of bleeding was       found in the duodenal bulb. The lesion was 4-5 mm in largest dimension,       healing in appearance.      The exam of the duodenum was otherwise normal. Impression:           - LA Grade B erosive esophagitis. Biopsied.                       - Abnormal esophageal motility.                       - Small hiatal hernia.                       - Gastric ulcer, duodenal ulcer Recommendation:       - Use Protonix (pantoprazole) 40 mg PO BID for 1 month.                       - Use Protonix (pantoprazole) 40 mg PO daily.                       - Return to GI clinic in 5 weeks.                       - Await pathology results.                       - Repeat upper endoscopy in 2 months to check healing. Procedure Code(s):    --- Professional ---                       [phone], Esophagogastroduodenoscopy, flexible, transoral;                        with biopsy, single or multiple Diagnosis Code(s):    --- Professional ---                       K20.8, Other esophagitis                       K22.4, Dyskinesia of esophagus                       K44.9, Diaphragmatic hernia without obstruction or                        gangrene                       R10.13, Epigastric pain                       R13.10, Dysphagia, unspecified CPT copyright 2016 [employer]. All rights reserved. The codes documented in this report are preliminary and upon coder review may  be revised to meet current compliance requirements. Lollie Sails, MD 10/09/2017 2:14:11 PM This report  has  been signed electronically. Number of Addenda: 0 Note Initiated On: 10/09/2017 1:35 PM      [facility]

## 2017-10-09 NOTE — Op Note (Signed)
[facility] : [facility] Gastroenterology Patient Name: Traci Lynn Procedure Date: 10/09/2017 1:34 PM MRN: [id number] Account #: [id number] Date of Birth: 06-Apr-1951 Admit Type: Outpatient Age: 66 Room: [facility] ENDO ROOM 1 Gender: Female Note Status: Finalized Procedure:            Colonoscopy Indications:          Personal history of colonic polyps Providers:            Lollie Sails, MD Medicines:            Monitored Anesthesia Care Complications:        No immediate complications. Procedure:            Pre-Anesthesia Assessment:                       - ASA Grade Assessment: III - A patient with severe                        systemic disease.                       After obtaining informed consent, the colonoscope was                        passed under direct vision. Throughout the procedure,                        the patient's blood pressure, pulse, and oxygen                        saturations were monitored continuously. The Olympus                        CF-H180AL colonoscope ( S#: [id number] ) was introduced                        through the anus and advanced to the the cecum,                        identified by appendiceal orifice and ileocecal valve.                        The colonoscopy was performed with moderate difficulty                        due to poor bowel prep. Successful completion of the                        procedure was aided by lavage. The quality of the bowel                        preparation was fair. Findings:      A few small-mouthed diverticula were found in the sigmoid colon and       distal descending colon.      The exam was otherwise without abnormality.      The digital rectal exam was normal. Impression:           - Preparation of the colon was fair.                       -  Diverticulosis in the sigmoid colon and in the distal                        descending colon.                       - The examination was otherwise  normal.                       - No specimens collected. Recommendation:       - Discharge patient to home.                       - Advance diet as tolerated.                       - Repeat colonoscopy in 5 years for surveillance. Procedure Code(s):    --- Professional ---                       [phone], Colonoscopy, flexible; diagnostic, including                        collection of specimen(s) by brushing or washing, when                        performed (separate procedure) Diagnosis Code(s):    --- Professional ---                       Z86.010, Personal history of colonic polyps                       K57.30, Diverticulosis of large intestine without                        perforation or abscess without bleeding CPT copyright 2016 American Medical Association. All rights reserved. The codes documented in this report are preliminary and upon coder review may  be revised to meet current compliance requirements. Lollie Sails, MD 10/09/2017 2:47:56 PM This report has been signed electronically. Number of Addenda: 0 Note Initiated On: 10/09/2017 1:34 PM Scope Withdrawal Time: 0 hours 12 minutes 19 seconds  Total Procedure Duration: 0 hours 22 minutes 57 seconds       [facility]

## 2017-10-09 NOTE — H&P (Signed)
Outpatient short stay form Pre-procedure 10/09/2017 1:27 PM Traci Sails MD  Primary Physician: Dr. Dion Body  Reason for visit:  EGD and colonoscopy  History of present illness:  Patient is a 66 year old female presenting today as above. She has a personal history of adenomatous colon polyps. She does have some intermittent cervical area dysphagia. She has had a barium swallow which was normal including pill passage. She also does not regurgitate foods. We discussed this at length today. She has had some good response to a low dose of Nexium. She tolerated her prep well. She takes no aspirin or blood thinning agents.    Current Facility-Administered Medications:  .  0.9 %  sodium chloride infusion, , Intravenous, Continuous, Traci Sails, MD, Last Rate: 20 mL/hr at 10/09/17 1318, 1,000 mL at 10/09/17 1318 .  0.9 %  sodium chloride infusion, , Intravenous, Continuous, Traci Sails, MD .  0.9 %  sodium chloride infusion, , Intravenous, Continuous, Traci Sails, MD .  0.9 %  sodium chloride infusion, , Intravenous, Continuous, Traci Sails, MD  Prescriptions Prior to Admission  Medication Sig Dispense Refill Last Dose  . acetaminophen (TYLENOL) 500 MG tablet Take 1 tablet (500 mg total) by mouth every 6 (six) hours as needed for mild pain, moderate pain, fever or headache. (Patient taking differently: Take 1,000 mg by mouth every 6 (six) hours as needed for mild pain, moderate pain, fever or headache. ) 30 tablet 0 Past Week at Unknown time  . albuterol (PROVENTIL HFA;VENTOLIN HFA) 108 (90 Base) MCG/ACT inhaler Inhale 1-2 puffs into the lungs every 6 (six) hours as needed for wheezing or shortness of breath.   Past Week at Unknown time  . Ascorbic Acid (VITAMIN C PO) Take 1 tablet by mouth daily.   Past Week at Unknown time  . Calcium Carbonate-Vitamin D (CALCIUM 600+D PO) Take 1 tablet by mouth daily.   Past Week at Unknown time  . cetirizine (ZYRTEC) 10 MG  tablet Take 10 mg by mouth daily.   Past Week at Unknown time  . Cholecalciferol (VITAMIN D3) 5000 units CAPS Take 5,000 Units by mouth daily.   Past Week at Unknown time  . Cyanocobalamin (VITAMIN B-12 PO) Take 1 tablet by mouth daily.   Past Week at Unknown time  . cyclobenzaprine (FLEXERIL) 10 MG tablet Take 1 tablet (10 mg total) by mouth 2 (two) times daily as needed for muscle spasms. Do not drive while taking as can cause drowsiness 15 tablet 0 Past Week at Unknown time  . EPINEPHrine (EPI-PEN) 0.3 mg/0.3 mL SOAJ injection Inject 0.3 mg into the muscle as needed (allergic reaction).    Past Month at Unknown time  . meloxicam (MOBIC) 15 MG tablet Take 1 tablet (15 mg total) by mouth daily as needed. 10 tablet 0 Past Week at Unknown time  . metoprolol tartrate (LOPRESSOR) 25 MG tablet Take 50 mg by mouth at bedtime.   10/08/2017 at Unknown time  . Multiple Vitamins-Minerals (CENTRUM SILVER PO) Take 1 tablet by mouth daily.   Past Week at Unknown time  . VITAMIN E PO Take 1 capsule by mouth daily.   Past Week at Unknown time     Allergies  Allergen Reactions  . Pork-Derived Products Anaphylaxis  . Penicillin V Potassium Hives and Rash    Has patient had a PCN reaction causing immediate rash, facial/tongue/throat swelling, SOB or lightheadedness with hypotension: Yes Has patient had a PCN reaction causing severe rash involving  mucus membranes or skin necrosis: Yes Has patient had a PCN reaction that required hospitalization: No Has patient had a PCN reaction occurring within the last 10 years: No If all of the above answers are "NO", then may proceed with Cephalosporin use.   . Shellfish Allergy Hives and Swelling     Past Medical History:  Diagnosis Date  . Dysrhythmia   . Lumbago   . Paroxysmal atrial tachycardia ([place])   . Tubular adenoma of colon     Review of systems:      Physical Exam    Heart and lungs: Regular rate and rhythm without rub or gallop, lungs are  bilaterally clear.    HEENT: Normocephalic atraumatic eyes are anicteric    Other:     Pertinant exam for procedure: Soft nontender nondistended bowel sounds positive normoactive    Planned proceedures: EGD and colonoscopy with indicated procedures. I have discussed the risks benefits and complications of procedures to include not limited to bleeding, infection, perforation and the risk of sedation and the patient wishes to proceed.    Traci Sails, MD Gastroenterology 10/09/2017  1:27 PM

## 2017-10-10 NOTE — Progress Notes (Signed)
Non-identifying Voice.  No Message left.

## 2017-10-12 ENCOUNTER — Encounter: Payer: Self-pay | Admitting: Gastroenterology

## 2017-10-12 NOTE — Anesthesia Postprocedure Evaluation (Signed)
Anesthesia Post Note  Patient: Traci Lynn  Procedure(s) Performed: ESOPHAGOGASTRODUODENOSCOPY (EGD) WITH PROPOFOL (N/A ) COLONOSCOPY WITH PROPOFOL (N/A )  Patient location during evaluation: Endoscopy Anesthesia Type: General Level of consciousness: awake and alert Pain management: pain level controlled Vital Signs Assessment: post-procedure vital signs reviewed and stable Respiratory status: spontaneous breathing, nonlabored ventilation, respiratory function stable and patient connected to nasal cannula oxygen Cardiovascular status: blood pressure returned to baseline and stable Postop Assessment: no apparent nausea or vomiting Anesthetic complications: no     Last Vitals:  Vitals:   10/09/17 1505 10/09/17 1515  BP: 122/75 (!) 129/92  Pulse: 61 (!) 53  Resp: 16 16  Temp:    SpO2: 97% 100%    Last Pain:  Vitals:   10/09/17 1445  TempSrc: Tympanic  PainSc: Asleep                 Precious Haws Alizaya Oshea

## 2017-10-12 NOTE — Anesthesia Post-op Follow-up Note (Signed)
Anesthesia QCDR form completed.        

## 2017-10-12 NOTE — Transfer of Care (Signed)
Immediate Anesthesia Transfer of Care Note  Patient: Traci Lynn  Procedure(s) Performed: ESOPHAGOGASTRODUODENOSCOPY (EGD) WITH PROPOFOL (N/A ) COLONOSCOPY WITH PROPOFOL (N/A )  Patient Location: PACU  Anesthesia Type:General  Level of Consciousness: sedated  Airway & Oxygen Therapy: Patient Spontanous Breathing and Patient connected to nasal cannula oxygen  Post-op Assessment: Report given to RN and Post -op Vital signs reviewed and stable  Post vital signs: Reviewed and stable  Last Vitals:  Vitals:   10/09/17 1505 10/09/17 1515  BP: 122/75 (!) 129/92  Pulse: 61 (!) 53  Resp: 16 16  Temp:    SpO2: 97% 100%    Last Pain:  Vitals:   10/09/17 1445  TempSrc: Tympanic  PainSc: Asleep      Patients Stated Pain Goal: 0 (47/18/55 0158)  Complications: No apparent anesthesia complications

## 2017-10-13 LAB — SURGICAL PATHOLOGY

## 2017-10-28 ENCOUNTER — Other Ambulatory Visit: Payer: Self-pay

## 2017-10-28 ENCOUNTER — Ambulatory Visit
Admission: EM | Admit: 2017-10-28 | Discharge: 2017-10-28 | Disposition: A | Discharge status: Home / Self Care | Payer: Medicare HMO | Attending: Family Medicine | Admitting: Family Medicine

## 2017-10-28 ENCOUNTER — Encounter: Payer: Self-pay | Admitting: Emergency Medicine

## 2017-10-28 DIAGNOSIS — H9202 Otalgia, left ear: Secondary | ICD-10-CM | POA: Diagnosis not present

## 2017-10-28 DIAGNOSIS — J029 Acute pharyngitis, unspecified: Secondary | ICD-10-CM | POA: Diagnosis not present

## 2017-10-28 MED ORDER — NEOMYCIN-POLYMYXIN-HC 3.5-10000-1 OT SUSP: 4.0000 [drp] | Freq: Three times a day (TID) | OTIC | 0 refills | Status: DC

## 2017-10-28 MED ORDER — AZITHROMYCIN 250 MG PO TABS: ORAL_TABLET | ORAL | 0 refills | Status: DC

## 2017-10-28 NOTE — ED Provider Notes (Signed)
[facility]    CSN: [id number] Arrival date & time: 10/28/17  1856   History   Chief Complaint Chief Complaint  Patient presents with  . Otalgia    left ear   HPI The history is provided by the patient.  Otalgia  Location:  Left Quality:  Sharp Severity:  Severe Onset quality:  Sudden Duration: Started yesterday evening/ Timing:  Constant Progression:  Worsening Chronicity:  New Relieved by:  Nothing Worsened by:  Nothing Ineffective treatments:  None tried Associated symptoms: congestion and sore throat   Associated symptoms: no fever    Past Medical History:  Diagnosis Date  . Dysrhythmia   . Lumbago   . Paroxysmal atrial tachycardia ([place])   . Tubular adenoma of colon     Patient Active Problem List   Diagnosis Date Noted  . Left arm pain 01/20/2014    Past Surgical History:  Procedure Laterality Date  . ABDOMINAL HYSTERECTOMY    . CARPAL TUNNEL RELEASE    . CATARACT EXTRACTION W/PHACO Right 04/15/2017   Procedure: CATARACT EXTRACTION PHACO AND INTRAOCULAR LENS PLACEMENT (IOC);  Surgeon: Estill Cotta, MD;  Location: [facility] ORS;  Service: Ophthalmology;  Laterality: Right;  Lot# [id number] H [place]: 01:02.8 AP%: 19.7 CDE: 25.43  . CATARACT EXTRACTION W/PHACO Left 05/13/2017   Procedure: CATARACT EXTRACTION PHACO AND INTRAOCULAR LENS PLACEMENT (IOC);  Surgeon: Estill Cotta, MD;  Location: [facility] ORS;  Service: Ophthalmology;  Laterality: Left;  [place] 1:07.6 AP% 22.9 CDE 31.29 Fluid Pack lot # [id number]  . COLONOSCOPY WITH PROPOFOL N/A 10/09/2017   Procedure: COLONOSCOPY WITH PROPOFOL;  Surgeon: Lollie Sails, MD;  Location: [facility] ENDOSCOPY;  Service: Endoscopy;  Laterality: N/A;  . ESOPHAGOGASTRODUODENOSCOPY (EGD) WITH PROPOFOL N/A 10/09/2017   Procedure: ESOPHAGOGASTRODUODENOSCOPY (EGD) WITH PROPOFOL;  Surgeon: Lollie Sails, MD;  Location: [facility] ENDOSCOPY;  Service: Endoscopy;  Laterality: N/A;  . TMJ ARTHROPLASTY    . TONSILLECTOMY AND  ADENOIDECTOMY      OB History    No data available       Home Medications    Prior to Admission medications   Medication Sig Start Date End Date Taking? Authorizing Provider  albuterol (PROVENTIL HFA;VENTOLIN HFA) 108 (90 Base) MCG/ACT inhaler Inhale 1-2 puffs into the lungs every 6 (six) hours as needed for wheezing or shortness of breath.   Yes [provider]  Ascorbic Acid (VITAMIN C PO) Take 1 tablet by mouth daily.   Yes [provider]  Calcium Carbonate-Vitamin D (CALCIUM 600+D PO) Take 1 tablet by mouth daily.   Yes [provider]  cetirizine (ZYRTEC) 10 MG tablet Take 10 mg by mouth daily.   Yes [provider]  Cholecalciferol (VITAMIN D3) 5000 units CAPS Take 5,000 Units by mouth daily.   Yes [provider]  Cyanocobalamin (VITAMIN B-12 PO) Take 1 tablet by mouth daily.   Yes [provider]  EPINEPHrine (EPI-PEN) 0.3 mg/0.3 mL SOAJ injection Inject 0.3 mg into the muscle as needed (allergic reaction).    Yes [provider]  metoprolol tartrate (LOPRESSOR) 25 MG tablet Take 50 mg by mouth at bedtime.   Yes [provider]  Multiple Vitamins-Minerals (CENTRUM SILVER PO) Take 1 tablet by mouth daily.   Yes [provider]  VITAMIN E PO Take 1 capsule by mouth daily.   Yes [provider]  acetaminophen (TYLENOL) 500 MG tablet Take 1 tablet (500 mg total) by mouth every 6 (six) hours as needed for mild pain, moderate pain, fever  or headache. Patient taking differently: Take 1,000 mg by mouth every 6 (six) hours as needed for mild pain, moderate pain, fever or headache.  10/12/15   Betancourt, Aura Fey, NP  azithromycin (ZITHROMAX) 250 MG tablet 2 tablets on Day 1, then 1 tablet daily on Days 2-5. 10/28/17   Coral Spikes, DO  neomycin-polymyxin-hydrocortisone (CORTISPORIN) 3.5-10000-1 OTIC suspension Place 4 drops into the left ear 3 (three) times daily. 10/28/17   Coral Spikes, DO     Family History Family History  Problem Relation Age of Onset  . Kidney disease Father        Deceased, 60  . Colon cancer Mother        Deceased 30  . Healthy Son   . Breast cancer Neg Hx     Social History Social History   Tobacco Use  . Smoking status: Former Smoker    Types: Cigarettes    Last attempt to quit: 06/01/1971    Years since quitting: 46.4  . Smokeless tobacco: Never Used  Substance Use Topics  . Alcohol use: No  . Drug use: No     Allergies   Pork-derived products; Penicillin v potassium; and Shellfish allergy   Review of Systems Review of Systems  Constitutional: Negative for chills and fever.  HENT: Positive for congestion, ear pain and sore throat.      Physical Exam Triage Vital Signs ED Triage Vitals  Enc Vitals Group     BP 10/28/17 0849 136/74     Pulse Rate 10/28/17 0849 60     Resp 10/28/17 0849 16     Temp 10/28/17 0849 98 F (36.7 C)     Temp Source 10/28/17 0849 Oral     SpO2 10/28/17 0849 97 %     Weight 10/28/17 0845 177 lb 11.1 oz (80.6 kg)     Height 10/28/17 0845 5\' 4"  (1.626 m)     Head Circumference --      Peak Flow --      Pain Score 10/28/17 0846 5     Pain Loc --      Pain Edu? --      Excl. in [place]? --    No data found.  Updated Vital Signs BP 136/74 (BP Location: Left Arm)   Pulse 60   Temp 98 F (36.7 C) (Oral)   Resp 16   Ht 5\' 4"  (1.626 m)   Wt 177 lb 11.1 oz (80.6 kg)   SpO2 97%   BMI 30.50 kg/m   Physical Exam  Constitutional: She is oriented to person, place, and time. She appears well-developed. No distress.  HENT:  Head: Normocephalic and atraumatic.  Mouth/Throat: Oropharynx is clear and moist.  Right TM normal. Left ear -tragal tenderness, erythema of the canal; dull, erythematous TM with effusion.  Eyes: Conjunctivae are normal. No scleral icterus.  Neck: Neck supple.  Cardiovascular: Normal rate and regular rhythm.  No murmur heard. Pulmonary/Chest: Effort normal and breath sounds  normal. She has no wheezes. She has no rales.  Lymphadenopathy:    She has no cervical adenopathy.  Neurological: She is alert and oriented to person, place, and time.  Skin: Skin is warm. No rash noted.  Psychiatric: She has a normal mood and affect.  Vitals reviewed.  UC Treatments / Results  Labs (all labs ordered are listed, but only abnormal results are displayed) Labs Reviewed - No data to display  EKG  EKG Interpretation None  Radiology No results found.  Procedures Procedures (including critical care time)  Medications Ordered in UC Medications - No data to display   Initial Impression / Assessment and Plan / UC Course  I have reviewed the triage vital signs and the nursing notes.  Pertinent labs & imaging results that were available during my care of the patient were reviewed by me and considered in my medical decision making (see chart for details).     66 year old female presents with otalgia.  Patient has features of both otitis media and otitis externa.  Treating with azithromycin and Cortisporin otic.  Final Clinical Impressions(s) / UC Diagnoses   Final diagnoses:  Otalgia of left ear    ED Discharge Orders        Ordered    azithromycin (ZITHROMAX) 250 MG tablet     10/28/17 0915    neomycin-polymyxin-hydrocortisone (CORTISPORIN) 3.5-10000-1 OTIC suspension  3 times daily     10/28/17 0915     Controlled Substance Prescriptions [place] Controlled Substance Registry consulted? Not Applicable   Coral Spikes, [place] 10/28/17 2841

## 2017-10-28 NOTE — ED Triage Notes (Signed)
Patient c/o sinus congestion and pressure and left ear pain that started yesterday.  Patient denies fevers.

## 2017-10-28 NOTE — Discharge Instructions (Signed)
Medications as prescribed. ° °Take care ° °Dr. Ifeoluwa Beller  °

## 2017-11-12 DIAGNOSIS — H60392 Other infective otitis externa, left ear: Secondary | ICD-10-CM | POA: Diagnosis not present

## 2017-11-26 DIAGNOSIS — H6122 Impacted cerumen, left ear: Secondary | ICD-10-CM | POA: Diagnosis not present

## 2017-11-26 DIAGNOSIS — H6063 Unspecified chronic otitis externa, bilateral: Secondary | ICD-10-CM | POA: Diagnosis not present

## 2017-12-21 ENCOUNTER — Encounter: Payer: Self-pay | Admitting: Student

## 2017-12-22 ENCOUNTER — Ambulatory Visit: Payer: Medicare HMO | Admitting: Anesthesiology

## 2017-12-22 ENCOUNTER — Encounter
Admission: RE | Disposition: A | Discharge status: Home / Self Care | Payer: Self-pay | Source: Ambulatory Visit | Attending: Gastroenterology

## 2017-12-22 ENCOUNTER — Ambulatory Visit
Admission: RE | Admit: 2017-12-22 | Discharge: 2017-12-22 | Disposition: A | Discharge status: Home / Self Care | Payer: Medicare HMO | Source: Ambulatory Visit | Attending: Gastroenterology | Admitting: Gastroenterology

## 2017-12-22 ENCOUNTER — Encounter: Payer: Self-pay | Admitting: Emergency Medicine

## 2017-12-22 DIAGNOSIS — K269 Duodenal ulcer, unspecified as acute or chronic, without hemorrhage or perforation: Secondary | ICD-10-CM | POA: Insufficient documentation

## 2017-12-22 DIAGNOSIS — Z880 Allergy status to penicillin: Secondary | ICD-10-CM | POA: Insufficient documentation

## 2017-12-22 DIAGNOSIS — Z79899 Other long term (current) drug therapy: Secondary | ICD-10-CM | POA: Insufficient documentation

## 2017-12-22 DIAGNOSIS — K224 Dyskinesia of esophagus: Secondary | ICD-10-CM | POA: Diagnosis not present

## 2017-12-22 DIAGNOSIS — Z87891 Personal history of nicotine dependence: Secondary | ICD-10-CM | POA: Diagnosis not present

## 2017-12-22 DIAGNOSIS — K259 Gastric ulcer, unspecified as acute or chronic, without hemorrhage or perforation: Secondary | ICD-10-CM | POA: Diagnosis not present

## 2017-12-22 DIAGNOSIS — K210 Gastro-esophageal reflux disease with esophagitis: Secondary | ICD-10-CM | POA: Diagnosis not present

## 2017-12-22 DIAGNOSIS — K2970 Gastritis, unspecified, without bleeding: Secondary | ICD-10-CM | POA: Diagnosis not present

## 2017-12-22 DIAGNOSIS — Z91013 Allergy to seafood: Secondary | ICD-10-CM | POA: Diagnosis not present

## 2017-12-22 DIAGNOSIS — K2950 Unspecified chronic gastritis without bleeding: Secondary | ICD-10-CM | POA: Diagnosis not present

## 2017-12-22 DIAGNOSIS — K2210 Ulcer of esophagus without bleeding: Secondary | ICD-10-CM | POA: Insufficient documentation

## 2017-12-22 DIAGNOSIS — R857 Abnormal histological findings in specimens from digestive organs and abdominal cavity: Secondary | ICD-10-CM | POA: Diagnosis not present

## 2017-12-22 DIAGNOSIS — K219 Gastro-esophageal reflux disease without esophagitis: Secondary | ICD-10-CM | POA: Diagnosis not present

## 2017-12-22 DIAGNOSIS — K228 Other specified diseases of esophagus: Secondary | ICD-10-CM | POA: Diagnosis not present

## 2017-12-22 HISTORY — PX: ESOPHAGOGASTRODUODENOSCOPY (EGD) WITH PROPOFOL: SHX5813

## 2017-12-22 HISTORY — DX: Gastro-esophageal reflux disease without esophagitis: K21.9

## 2017-12-22 SURGERY — ESOPHAGOGASTRODUODENOSCOPY (EGD) WITH PROPOFOL
Anesthesia: General

## 2017-12-22 MED ORDER — PROPOFOL 10 MG/ML IV BOLUS
INTRAVENOUS | Status: DC | PRN
  Administered 2017-12-22: 100 mg via INTRAVENOUS

## 2017-12-22 MED ORDER — SODIUM CHLORIDE 0.9 % IV SOLN: INTRAVENOUS | Status: DC

## 2017-12-22 MED ORDER — PROPOFOL 500 MG/50ML IV EMUL
INTRAVENOUS | Status: DC | PRN
  Administered 2017-12-22: 160 ug/kg/min via INTRAVENOUS

## 2017-12-22 MED ORDER — SODIUM CHLORIDE 0.9 % IV BOLUS (SEPSIS)
1000.0000 mL | Freq: Once | INTRAVENOUS | Status: AC
  Administered 2017-12-22: 1000 mL via INTRAVENOUS

## 2017-12-22 MED ORDER — PROPOFOL 500 MG/50ML IV EMUL
INTRAVENOUS | Status: AC
  Filled 2017-12-22: qty 50

## 2017-12-22 MED ORDER — FENTANYL CITRATE (PF) 100 MCG/2ML IJ SOLN
INTRAMUSCULAR | Status: AC
  Filled 2017-12-22: qty 2

## 2017-12-22 MED ORDER — FENTANYL CITRATE (PF) 100 MCG/2ML IJ SOLN
INTRAMUSCULAR | Status: DC | PRN
  Administered 2017-12-22 (×2): 50 ug via INTRAVENOUS

## 2017-12-22 MED ORDER — SODIUM CHLORIDE 0.9 % IV SOLN
INTRAVENOUS | Status: DC | PRN
  Administered 2017-12-22: 14:00:00 via INTRAVENOUS

## 2017-12-22 MED ORDER — LIDOCAINE 2% (20 MG/ML) 5 ML SYRINGE
INTRAMUSCULAR | Status: DC | PRN
  Administered 2017-12-22: 40 mg via INTRAVENOUS

## 2017-12-22 NOTE — Transfer of Care (Signed)
Immediate Anesthesia Transfer of Care Note  Patient: Traci Lynn  Procedure(s) Performed: ESOPHAGOGASTRODUODENOSCOPY (EGD) WITH PROPOFOL (N/A )  Patient Location: PACU and Endoscopy Unit  Anesthesia Type:General  Level of Consciousness: drowsy  Airway & Oxygen Therapy: Patient Spontanous Breathing and Patient connected to nasal cannula oxygen  Post-op Assessment: Report given to RN  Post vital signs: Reviewed and stable  Last Vitals:  Vitals:   12/22/17 1326  BP: (!) 141/78  Pulse: 74  Resp: 16  Temp: 36.8 C  SpO2: 97%    Last Pain:  Vitals:   12/22/17 1326  TempSrc: Oral         Complications: No apparent anesthesia complications

## 2017-12-22 NOTE — Op Note (Signed)
[facility] Gastroenterology Patient Name: Traci Lynn Procedure Date: 12/22/2017 2:16 PM MRN: [id number] Account #: [id number] Date of Birth: 1951/04/25 Admit Type: Outpatient Age: 67 Room: [facility] ENDO ROOM 3 Gender: Female Note Status: Finalized Procedure:            Upper GI endoscopy Indications:          Follow-up of gastric ulcer Providers:            Lollie Sails, MD Referring MD:         Dion Body (Referring MD) Medicines:            Monitored Anesthesia Care Complications:        No immediate complications. Procedure:            Pre-Anesthesia Assessment:                       - ASA Grade Assessment: III - A patient with severe                        systemic disease.                       After obtaining informed consent, the endoscope was                        passed under direct vision. Throughout the procedure,                        the patient's blood pressure, pulse, and oxygen                        saturations were monitored continuously. The Endoscope                        was introduced through the mouth, and advanced to the                        third part of duodenum. The upper GI endoscopy was                        accomplished without difficulty. The patient tolerated                        the procedure well. Findings:      Abnormal motility was noted in the middle third of the esophagus and in       the lower third of the esophagus. The cricopharyngeus was normal. There       is spasticity and extra peristaltic waves of the esophageal body.       Tertiary peristaltic waves are noted.      The Z-line was irregular. Biopsies were taken with a cold forceps for       histology.      The exam of the esophagus was otherwise normal.      Patchy mild inflammation characterized by congestion (edema) and       erythema was found in the gastric body. This could also show a mild       portal gastropathy. Previously noted gastric  ulcer healed. Biopsies were       taken with a cold forceps for histology.      The examined duodenum  was normal. Impression:           - Abnormal esophageal motility.                       - Z-line irregular. Biopsied.                       - Gastritis. Biopsied.                       - Normal examined duodenum. Recommendation:       - Continue present medications.                       - Return to GI clinic in 8 weeks.                       - Check liver enzymes (AST, ALT, alkaline phosphatase,                        bilirubin) at appointment to be scheduled. Procedure Code(s):    --- Professional ---                       [phone], Esophagogastroduodenoscopy, flexible, transoral;                        with biopsy, single or multiple Diagnosis Code(s):    --- Professional ---                       K22.4, Dyskinesia of esophagus                       K22.8, Other specified diseases of esophagus                       K29.70, Gastritis, unspecified, without bleeding                       K25.9, Gastric ulcer, unspecified as acute or chronic,                        without hemorrhage or perforation CPT copyright 2016 American Medical Association. All rights reserved. The codes documented in this report are preliminary and upon coder review may  be revised to meet current compliance requirements. Lollie Sails, MD 12/22/2017 2:53:06 PM This report has been signed electronically. Number of Addenda: 0 Note Initiated On: 12/22/2017 2:16 PM      [facility]

## 2017-12-22 NOTE — Anesthesia Preprocedure Evaluation (Signed)
Anesthesia Evaluation  Patient identified by MRN, date of birth, ID band Patient awake    Reviewed: Allergy & Precautions, NPO status , Patient's Chart, lab work & pertinent test results  History of Anesthesia Complications Negative for: history of anesthetic complications  Airway Mallampati: II  TM Distance: <3 FB Neck ROM: limited    Dental  (+) Poor Dentition, Chipped   Pulmonary neg shortness of breath, former smoker,    breath sounds clear to auscultation       Cardiovascular Exercise Tolerance: Good (-) angina(-) Past MI + dysrhythmias  Rhythm:Regular     Neuro/Psych    GI/Hepatic negative GI ROS, Neg liver ROS, neg GERD  ,  Endo/Other  negative endocrine ROS  Renal/GU   negative genitourinary   Musculoskeletal   Abdominal Normal abdominal exam  (+)   Peds negative pediatric ROS (+)  Hematology negative hematology ROS (+)   Anesthesia Other Findings Past Medical History: No date: Dysrhythmia No date: Lumbago No date: Paroxysmal atrial tachycardia ([facility]) No date: Tubular adenoma of colon   Reproductive/Obstetrics                             Anesthesia Physical  Anesthesia Plan  ASA: III  Anesthesia Plan: General   Post-op Pain Management:    Induction: Intravenous  PONV Risk Score and Plan: 3 and Propofol infusion  Airway Management Planned: Natural Airway and Nasal Cannula  Additional Equipment:   Intra-op Plan:   Post-operative Plan:   Informed Consent: I have reviewed the patients History and Physical, chart, labs and discussed the procedure including the risks, benefits and alternatives for the proposed anesthesia with the patient or authorized representative who has indicated his/her understanding and acceptance.   Dental Advisory Given  Plan Discussed with: Anesthesiologist, CRNA and Surgeon  Anesthesia Plan Comments: (Patient consented for risks of  anesthesia including but not limited to:  - adverse reactions to medications - risk of intubation if required - damage to teeth, lips or other oral mucosa - sore throat or hoarseness - Damage to heart, brain, lungs or loss of life  Patient voiced understanding.)        Anesthesia Quick Evaluation

## 2017-12-22 NOTE — Anesthesia Post-op Follow-up Note (Signed)
Anesthesia QCDR form completed.        

## 2017-12-22 NOTE — H&P (Signed)
Outpatient short stay form Pre-procedure 12/22/2017 2:16 PM Traci Sails MD  Primary Physician: Dr Dion Body  Reason for visit:  EGD  History of present illness:  Patient is a 67 year old female presenting today as above. She has a personal history of an EGD being done 10/09/2017 for dyspepsia and dysphagia. She was found however to have grade B erosive esophagitis as well as gastric and duodenal ulcers. Biopsies were negative for Helicobacter pylori. She was placed on twice a day PPI for month and then down to once a day. She is currently taking that. She states that she will occasionally have some breakthrough reflux but has improved in terms of her symptoms. She has no abdominal pain nausea or vomiting.  She takes no aspirin or blood thinning agents she takes no NSAIDs.    Current Facility-Administered Medications:  .  0.9 %  sodium chloride infusion, , Intravenous, Continuous, Traci Sails, MD  Facility-Administered Medications Ordered in Other Encounters:  .  0.9 %  sodium chloride infusion, , , Continuous PRN, Courtney Paris, CRNA  Medications Prior to Admission  Medication Sig Dispense Refill Last Dose  . acetaminophen (TYLENOL) 500 MG tablet Take 1 tablet (500 mg total) by mouth every 6 (six) hours as needed for mild pain, moderate pain, fever or headache. (Patient taking differently: Take 1,000 mg by mouth every 6 (six) hours as needed for mild pain, moderate pain, fever or headache. ) 30 tablet 0 Past Week at Unknown time  . albuterol (PROVENTIL HFA;VENTOLIN HFA) 108 (90 Base) MCG/ACT inhaler Inhale 1-2 puffs into the lungs every 6 (six) hours as needed for wheezing or shortness of breath.   Past Week at Unknown time  . Ascorbic Acid (VITAMIN C PO) Take 1 tablet by mouth daily.   12/21/2017 at Unknown time  . azithromycin (ZITHROMAX) 250 MG tablet 2 tablets on Day 1, then 1 tablet daily on Days 2-5. 6 tablet 0 Past Week at Unknown time  . Calcium  Carbonate-Vitamin D (CALCIUM 600+D PO) Take 1 tablet by mouth daily.   12/21/2017 at Unknown time  . cetirizine (ZYRTEC) 10 MG tablet Take 10 mg by mouth daily.   12/21/2017 at Unknown time  . Cholecalciferol (VITAMIN D3) 5000 units CAPS Take 5,000 Units by mouth daily.   12/21/2017 at Unknown time  . Cyanocobalamin (VITAMIN B-12 PO) Take 1 tablet by mouth daily.   12/21/2017 at Unknown time  . EPINEPHrine (EPI-PEN) 0.3 mg/0.3 mL SOAJ injection Inject 0.3 mg into the muscle as needed (allergic reaction).    Past Month at Unknown time  . fluticasone (FLONASE) 50 MCG/ACT nasal spray Place into both nostrils daily.   Past Week at Unknown time  . levocetirizine (XYZAL) 5 MG tablet Take 5 mg by mouth every evening.   12/21/2017 at Unknown time  . meloxicam (MOBIC) 15 MG tablet Take 15 mg by mouth daily.   Past Month at Unknown time  . metoprolol tartrate (LOPRESSOR) 25 MG tablet Take 50 mg by mouth at bedtime.   12/21/2017 at Unknown time  . Multiple Vitamins-Minerals (CENTRUM SILVER PO) Take 1 tablet by mouth daily.   12/21/2017 at Unknown time  . neomycin-polymyxin-hydrocortisone (CORTISPORIN) 3.5-10000-1 OTIC suspension Place 4 drops into the left ear 3 (three) times daily. 10 mL 0 Past Month at Unknown time  . VITAMIN E PO Take 1 capsule by mouth daily.   12/21/2017 at Unknown time     Allergies  Allergen Reactions  . Pork-Derived Products Anaphylaxis  .  Penicillin V Potassium Hives and Rash    Has patient had a PCN reaction causing immediate rash, facial/tongue/throat swelling, SOB or lightheadedness with hypotension: Yes Has patient had a PCN reaction causing severe rash involving mucus membranes or skin necrosis: Yes Has patient had a PCN reaction that required hospitalization: No Has patient had a PCN reaction occurring within the last 10 years: No If all of the above answers are "NO", then may proceed with Cephalosporin use.   . Shellfish Allergy Hives and Swelling     Past Medical History:   Diagnosis Date  . Dysrhythmia   . GERD (gastroesophageal reflux disease)   . Lumbago   . Paroxysmal atrial tachycardia ([place])   . Tubular adenoma of colon     Review of systems:      Physical Exam    Heart and lungs: Regular rate and rhythm without rub or gallop, lungs are bilaterally clear.    HEENT: Normocephalic atraumatic eyes are anicteric    Other:     Pertinant exam for procedure: Soft nontender nondistended bowel sounds positive normoactive.    Planned proceedures: EGD and indicated procedures. I have discussed the risks benefits and complications of procedures to include not limited to bleeding, infection, perforation and the risk of sedation and the patient wishes to proceed.    Traci Sails, MD Gastroenterology 12/22/2017  2:16 PM

## 2017-12-23 ENCOUNTER — Encounter: Payer: Self-pay | Admitting: Gastroenterology

## 2017-12-24 NOTE — Anesthesia Postprocedure Evaluation (Signed)
Anesthesia Post Note  Patient: Traci Lynn  Procedure(s) Performed: ESOPHAGOGASTRODUODENOSCOPY (EGD) WITH PROPOFOL (N/A )  Patient location during evaluation: Endoscopy Anesthesia Type: General Level of consciousness: awake and alert Pain management: pain level controlled Vital Signs Assessment: post-procedure vital signs reviewed and stable Respiratory status: spontaneous breathing, nonlabored ventilation, respiratory function stable and patient connected to nasal cannula oxygen Cardiovascular status: blood pressure returned to baseline and stable Postop Assessment: no apparent nausea or vomiting Anesthetic complications: no     Last Vitals:  Vitals:   12/22/17 1445 12/22/17 1504  BP: 105/60 111/88  Pulse: 66   Resp: 12   Temp: (!) 35.4 C   SpO2: 97%     Last Pain:  Vitals:   12/23/17 0739  TempSrc:   PainSc: 0-No pain                 Martha Clan

## 2017-12-25 LAB — SURGICAL PATHOLOGY

## 2018-01-07 DIAGNOSIS — R799 Abnormal finding of blood chemistry, unspecified: Secondary | ICD-10-CM | POA: Diagnosis not present

## 2018-02-01 DIAGNOSIS — Z961 Presence of intraocular lens: Secondary | ICD-10-CM | POA: Diagnosis not present

## 2018-02-08 ENCOUNTER — Other Ambulatory Visit: Payer: Self-pay | Admitting: Gastroenterology

## 2018-02-08 DIAGNOSIS — R748 Abnormal levels of other serum enzymes: Secondary | ICD-10-CM

## 2018-02-08 DIAGNOSIS — R1314 Dysphagia, pharyngoesophageal phase: Secondary | ICD-10-CM | POA: Diagnosis not present

## 2018-02-08 DIAGNOSIS — K224 Dyskinesia of esophagus: Secondary | ICD-10-CM | POA: Diagnosis not present

## 2018-02-10 ENCOUNTER — Ambulatory Visit
Admission: RE | Admit: 2018-02-10 | Discharge: 2018-02-10 | Disposition: A | Discharge status: Home / Self Care | Payer: Medicare HMO | Source: Ambulatory Visit | Attending: Gastroenterology | Admitting: Gastroenterology

## 2018-02-10 DIAGNOSIS — R748 Abnormal levels of other serum enzymes: Secondary | ICD-10-CM | POA: Insufficient documentation

## 2018-02-10 DIAGNOSIS — K760 Fatty (change of) liver, not elsewhere classified: Secondary | ICD-10-CM | POA: Insufficient documentation

## 2018-02-27 ENCOUNTER — Encounter: Payer: Self-pay | Admitting: Gynecology

## 2018-02-27 ENCOUNTER — Other Ambulatory Visit: Payer: Self-pay

## 2018-02-27 ENCOUNTER — Ambulatory Visit
Admission: EM | Admit: 2018-02-27 | Discharge: 2018-02-27 | Disposition: A | Discharge status: Home / Self Care | Payer: Medicare HMO | Attending: Family Medicine | Admitting: Family Medicine

## 2018-02-27 DIAGNOSIS — R05 Cough: Secondary | ICD-10-CM | POA: Diagnosis not present

## 2018-02-27 DIAGNOSIS — J069 Acute upper respiratory infection, unspecified: Secondary | ICD-10-CM

## 2018-02-27 DIAGNOSIS — R059 Cough, unspecified: Secondary | ICD-10-CM

## 2018-02-27 DIAGNOSIS — H6502 Acute serous otitis media, left ear: Secondary | ICD-10-CM | POA: Diagnosis not present

## 2018-02-27 DIAGNOSIS — B9789 Other viral agents as the cause of diseases classified elsewhere: Secondary | ICD-10-CM

## 2018-02-27 MED ORDER — AZITHROMYCIN 250 MG PO TABS: ORAL_TABLET | ORAL | 0 refills | Status: DC

## 2018-02-27 MED ORDER — HYDROCOD POLST-CPM POLST ER 10-8 MG/5ML PO SUER: 5.0000 mL | Freq: Two times a day (BID) | ORAL | 0 refills | Status: DC | PRN

## 2018-02-27 NOTE — ED Provider Notes (Signed)
[facility]    CSN: [id number] Arrival date & time: 02/27/18  0801     History   Chief Complaint Chief Complaint  Patient presents with  . Sinusitis    HPI Traci Lynn is a 67 y.o. female.   The history is provided by the patient.  Sinusitis  Associated symptoms: congestion, cough, ear pain and rhinorrhea   URI  Presenting symptoms: congestion, cough, ear pain and rhinorrhea   Severity:  Moderate Onset quality:  Sudden Timing:  Constant Progression:  Worsening Chronicity:  New Relieved by:  None tried Ineffective treatments:  None tried Risk factors: sick contacts   Risk factors: not elderly, no chronic cardiac disease, no chronic kidney disease, no chronic respiratory disease, no diabetes mellitus, no immunosuppression, no recent illness and no recent travel     Past Medical History:  Diagnosis Date  . Dysrhythmia   . GERD (gastroesophageal reflux disease)   . Lumbago   . Paroxysmal atrial tachycardia ([place])   . Tubular adenoma of colon     Patient Active Problem List   Diagnosis Date Noted  . Left arm pain 01/20/2014    Past Surgical History:  Procedure Laterality Date  . ABDOMINAL HYSTERECTOMY    . CARPAL TUNNEL RELEASE    . CATARACT EXTRACTION W/PHACO Right 04/15/2017   Procedure: CATARACT EXTRACTION PHACO AND INTRAOCULAR LENS PLACEMENT (IOC);  Surgeon: Estill Cotta, MD;  Location: [facility] ORS;  Service: Ophthalmology;  Laterality: Right;  Lot# [id number] H [place]: 01:02.8 AP%: 19.7 CDE: 25.43  . CATARACT EXTRACTION W/PHACO Left 05/13/2017   Procedure: CATARACT EXTRACTION PHACO AND INTRAOCULAR LENS PLACEMENT (IOC);  Surgeon: Estill Cotta, MD;  Location: [facility] ORS;  Service: Ophthalmology;  Laterality: Left;  [place] 1:07.6 AP% 22.9 CDE 31.29 Fluid Pack lot # [id number]  . COLONOSCOPY WITH PROPOFOL N/A 10/09/2017   Procedure: COLONOSCOPY WITH PROPOFOL;  Surgeon: Lollie Sails, MD;  Location: [facility] ENDOSCOPY;  Service: Endoscopy;  Laterality:  N/A;  . ESOPHAGOGASTRODUODENOSCOPY (EGD) WITH PROPOFOL N/A 10/09/2017   Procedure: ESOPHAGOGASTRODUODENOSCOPY (EGD) WITH PROPOFOL;  Surgeon: Lollie Sails, MD;  Location[facility]COPY;  Service: Endoscopy;  Laterality: N/A;  . ESOPHAGOGASTRODUODENOSCOPY (EGD) WITH PROPOFOL N/A 12/22/2017   Procedure: ESOPHAGOGASTRODUODENOSCOPY (EGD) WITH PROPOFOL;  Surgeon: Lollie Sails, MD;  Location: [facility] ENDOSCOPY;  Service: Endoscopy;  Laterality: N/A;  . TMJ ARTHROPLASTY    . TONSILLECTOMY AND ADENOIDECTOMY      OB History   None      Home Medications    Prior to Admission medications   Medication Sig Start Date End Date Taking? Authorizing Provider  acetaminophen (TYLENOL) 500 MG tablet Take 1 tablet (500 mg total) by mouth every 6 (six) hours as needed for mild pain, moderate pain, fever or headache. Patient taking differently: Take 1,000 mg by mouth every 6 (six) hours as needed for mild pain, moderate pain, fever or headache.  10/12/15   Betancourt, Aura Fey, NP  albuterol (PROVENTIL HFA;VENTOLIN HFA) 108 (90 Base) MCG/ACT inhaler Inhale 1-2 puffs into the lungs every 6 (six) hours as needed for wheezing or shortness of breath.    [provider]  Ascorbic Acid (VITAMIN C PO) Take 1 tablet by mouth daily.    [provider]  azithromycin (ZITHROMAX Z-PAK) 250 MG tablet 2 tabs po once day 1, then 1 tab po qd for next 4 days 02/27/18   Norval Gable, MD  Calcium Carbonate-Vitamin D (CALCIUM 600+D PO) Take 1 tablet by mouth daily.    [provider]  cetirizine (ZYRTEC) 10 MG tablet Take 10 mg by mouth daily.    [provider]  chlorpheniramine-HYDROcodone (TUSSIONEX PENNKINETIC ER) 10-8 MG/5ML SUER Take 5 mLs by mouth every 12 (twelve) hours as needed. 02/27/18   Norval Gable, MD  Cholecalciferol (VITAMIN D3) 5000 units CAPS Take 5,000 Units by mouth daily.    [provider]  Cyanocobalamin (VITAMIN B-12 PO) Take 1 tablet by mouth daily.     [provider]  EPINEPHrine (EPI-PEN) 0.3 mg/0.3 mL SOAJ injection Inject 0.3 mg into the muscle as needed (allergic reaction).     [provider]  fluticasone (FLONASE) 50 MCG/ACT nasal spray Place into both nostrils daily.    [provider]  levocetirizine (XYZAL) 5 MG tablet Take 5 mg by mouth every evening.    [provider]  meloxicam (MOBIC) 15 MG tablet Take 15 mg by mouth daily.    [provider]  metoprolol tartrate (LOPRESSOR) 25 MG tablet Take 50 mg by mouth at bedtime.    [provider]  Multiple Vitamins-Minerals (CENTRUM SILVER PO) Take 1 tablet by mouth daily.    [provider]  neomycin-polymyxin-hydrocortisone (CORTISPORIN) 3.5-10000-1 OTIC suspension Place 4 drops into the left ear 3 (three) times daily. 10/28/17   Coral Spikes, DO  VITAMIN E PO Take 1 capsule by mouth daily.    [provider]    Family History Family History  Problem Relation Age of Onset  . Kidney disease Father        Deceased, 7  . Colon cancer Mother        Deceased 18  . Healthy Son   . Breast cancer Neg Hx     Social History Social History   Tobacco Use  . Smoking status: Former Smoker    Types: Cigarettes    Last attempt to quit: 06/01/1971    Years since quitting: 46.7  . Smokeless tobacco: Never Used  Substance Use Topics  . Alcohol use: No  . Drug use: No     Allergies   Pork-derived products; Penicillin v potassium; Shellfish allergy; and Penicillins   Review of Systems Review of Systems  HENT: Positive for congestion, ear pain and rhinorrhea.   Respiratory: Positive for cough.      Physical Exam Triage Vital Signs ED Triage Vitals  Enc Vitals Group     BP 02/27/18 0814 137/87     Pulse Rate 02/27/18 0814 92     Resp 02/27/18 0814 16     Temp 02/27/18 0814 99.2 F (37.3 C)     Temp Source 02/27/18 0814 Oral     SpO2 02/27/18 0814 98 %     Weight 02/27/18 0815 170 lb (77.1 kg)      Height 02/27/18 0815 5\' 6"  (1.676 m)     Head Circumference --      Peak Flow --      Pain Score 02/27/18 0815 8     Pain Loc --      Pain Edu? --      Excl. in [place]? --    No data found.  Updated Vital Signs BP 137/87 (BP Location: Left Arm)   Pulse 92   Temp 99.2 F (37.3 C) (Oral)   Resp 16   Ht 5\' 6"  (1.676 m)   Wt 170 lb (77.1 kg)   SpO2 98%   BMI 27.44 kg/m   Visual Acuity Right Eye Distance:   Left Eye Distance:  Bilateral Distance:    Right Eye Near:   Left Eye Near:    Bilateral Near:     Physical Exam  Constitutional: She appears well-developed and well-nourished. No distress.  HENT:  Head: Normocephalic and atraumatic.  Right Ear: Tympanic membrane, external ear and ear canal normal.  Left Ear: External ear and ear canal normal. Tympanic membrane is erythematous and bulging. A middle ear effusion is present.  Nose: Mucosal edema and rhinorrhea present. No nose lacerations, sinus tenderness, nasal deformity, septal deviation or nasal septal hematoma. No epistaxis.  No foreign bodies. Right sinus exhibits maxillary sinus tenderness and frontal sinus tenderness. Left sinus exhibits maxillary sinus tenderness and frontal sinus tenderness.  Mouth/Throat: Uvula is midline, oropharynx is clear and moist and mucous membranes are normal. No oropharyngeal exudate.  Eyes: Pupils are equal, round, and reactive to light. Conjunctivae and EOM are normal. Right eye exhibits no discharge. Left eye exhibits no discharge. No scleral icterus.  Neck: Normal range of motion. Neck supple. No thyromegaly present.  Cardiovascular: Normal rate, regular rhythm and normal heart sounds.  Pulmonary/Chest: Effort normal and breath sounds normal. No respiratory distress. She has no wheezes. She has no rales.  Lymphadenopathy:    She has no cervical adenopathy.  Skin: She is not diaphoretic.  Nursing note and vitals reviewed.    UC Treatments / Results  Labs (all labs ordered are  listed, but only abnormal results are displayed) Labs Reviewed - No data to display  EKG None Radiology No results found.  Procedures Procedures (including critical care time)  Medications Ordered in UC Medications - No data to display   Initial Impression / Assessment and Plan / UC Course  I have reviewed the triage vital signs and the nursing notes.  Pertinent labs & imaging results that were available during my care of the patient were reviewed by me and considered in my medical decision making (see chart for details).       Final Clinical Impressions(s) / UC Diagnoses   Final diagnoses:  Acute serous otitis media of left ear, recurrence not specified  Cough  Viral URI with cough    ED Discharge Orders        Ordered    azithromycin (ZITHROMAX Z-PAK) 250 MG tablet     02/27/18 0837    chlorpheniramine-HYDROcodone (TUSSIONEX PENNKINETIC ER) 10-8 MG/5ML SUER  Every 12 hours PRN     02/27/18 0837     1. diagnosis reviewed with patient 2. rx as per orders above; reviewed possible side effects, interactions, risks and benefits  3. Recommend supportive treatment with otc flonase 4. Follow-up prn if symptoms worsen or don't improve  Controlled Substance Prescriptions [place] Controlled Substance Registry consulted? Not Applicable   Norval Gable, MD 02/27/18 [phone]

## 2018-02-27 NOTE — ED Triage Notes (Signed)
Patient c/o facial pain /ongestion / cough x yesterday.

## 2018-03-03 DIAGNOSIS — J209 Acute bronchitis, unspecified: Secondary | ICD-10-CM | POA: Diagnosis not present

## 2018-03-03 DIAGNOSIS — H60392 Other infective otitis externa, left ear: Secondary | ICD-10-CM | POA: Diagnosis not present

## 2018-04-13 DIAGNOSIS — R945 Abnormal results of liver function studies: Secondary | ICD-10-CM | POA: Diagnosis not present

## 2018-05-28 DIAGNOSIS — K224 Dyskinesia of esophagus: Secondary | ICD-10-CM | POA: Diagnosis not present

## 2018-05-28 DIAGNOSIS — R1314 Dysphagia, pharyngoesophageal phase: Secondary | ICD-10-CM | POA: Diagnosis not present

## 2018-08-24 DIAGNOSIS — G8929 Other chronic pain: Secondary | ICD-10-CM | POA: Diagnosis not present

## 2018-08-24 DIAGNOSIS — M25512 Pain in left shoulder: Secondary | ICD-10-CM | POA: Diagnosis not present

## 2018-08-25 ENCOUNTER — Other Ambulatory Visit: Payer: Self-pay | Admitting: Family Medicine

## 2018-08-25 DIAGNOSIS — G8929 Other chronic pain: Secondary | ICD-10-CM

## 2018-08-25 DIAGNOSIS — M25512 Pain in left shoulder: Principal | ICD-10-CM

## 2018-09-09 ENCOUNTER — Ambulatory Visit
Admission: RE | Admit: 2018-09-09 | Discharge: 2018-09-09 | Disposition: A | Discharge status: Home / Self Care | Payer: Medicare HMO | Source: Ambulatory Visit | Attending: Family Medicine | Admitting: Family Medicine

## 2018-09-09 DIAGNOSIS — G8929 Other chronic pain: Secondary | ICD-10-CM

## 2018-09-09 DIAGNOSIS — M19012 Primary osteoarthritis, left shoulder: Secondary | ICD-10-CM | POA: Diagnosis not present

## 2018-09-09 DIAGNOSIS — M25512 Pain in left shoulder: Secondary | ICD-10-CM

## 2018-09-09 DIAGNOSIS — M75112 Incomplete rotator cuff tear or rupture of left shoulder, not specified as traumatic: Secondary | ICD-10-CM | POA: Diagnosis not present

## 2018-09-15 DIAGNOSIS — M25512 Pain in left shoulder: Secondary | ICD-10-CM | POA: Diagnosis not present

## 2018-09-15 DIAGNOSIS — M75112 Incomplete rotator cuff tear or rupture of left shoulder, not specified as traumatic: Secondary | ICD-10-CM | POA: Diagnosis not present

## 2018-09-20 ENCOUNTER — Encounter
Admission: RE | Admit: 2018-09-20 | Discharge: 2018-09-20 | Disposition: A | Discharge status: Home / Self Care | Payer: Medicare HMO | Source: Ambulatory Visit | Attending: Orthopedic Surgery | Admitting: Orthopedic Surgery

## 2018-09-20 ENCOUNTER — Other Ambulatory Visit: Payer: Self-pay

## 2018-09-20 DIAGNOSIS — Z01818 Encounter for other preprocedural examination: Secondary | ICD-10-CM | POA: Insufficient documentation

## 2018-09-20 DIAGNOSIS — Z01810 Encounter for preprocedural cardiovascular examination: Secondary | ICD-10-CM | POA: Diagnosis not present

## 2018-09-20 DIAGNOSIS — I499 Cardiac arrhythmia, unspecified: Secondary | ICD-10-CM | POA: Diagnosis not present

## 2018-09-20 HISTORY — DX: Unspecified asthma, uncomplicated: J45.909

## 2018-09-20 LAB — BASIC METABOLIC PANEL
Anion gap: 6 (ref 5–15)
BUN: 10 mg/dL (ref 8–23)
CALCIUM: 9.7 mg/dL (ref 8.9–10.3)
CO2: 30 mmol/L (ref 22–32)
Chloride: 105 mmol/L (ref 98–111)
Creatinine, Ser: 0.66 mg/dL (ref 0.44–1.00)
GFR calc Af Amer: >60 mL/min (ref >60)
Glucose, Bld: 83 mg/dL (ref 70–99)
POTASSIUM: 3.8 mmol/L (ref 3.5–5.1)
Sodium: 141 mmol/L (ref 135–145)

## 2018-09-20 LAB — CBC
HCT: 42.5 % (ref 36.0–46.0)
HEMOGLOBIN: 14.8 g/dL (ref 12.0–15.0)
MCH: 32.3 pg (ref 26.0–34.0)
MCHC: 34.8 g/dL (ref 30.0–36.0)
MCV: 92.8 fL (ref 80.0–100.0)
PLATELETS: 112 10*3/uL — AB (ref 150–400)
RBC: 4.58 MIL/uL (ref 3.87–5.11)
RDW: 11.9 % (ref 11.5–15.5)
WBC: 7.6 10*3/uL (ref 4.0–10.5)
nRBC: 0 % (ref 0.0–0.2)

## 2018-09-20 NOTE — Patient Instructions (Signed)
Your procedure is scheduled on: Friday, September 20, 2018 Report to Day Surgery on the 2nd floor of the [facility]. To find out your arrival time, please call [phone] between 1PM - 3PM on: Thursday, September 19, 2018  REMEMBER: Instructions that are not followed completely may result in serious medical risk, up to and including death; or upon the discretion of your surgeon and anesthesiologist your surgery may need to be rescheduled.  Do not eat food after midnight the night before surgery.  No gum chewing, lozengers or hard candies.  You may however, drink CLEAR liquids up to 2 hours before you are scheduled to arrive for your surgery. Do not drink anything within 2 hours of the start of your surgery.  Clear liquids include: - water  - apple juice without pulp - gatorade - black coffee or tea (Do NOT add milk or creamers to the coffee or tea) Do NOT drink anything that is not on this list.  No Alcohol for 24 hours before or after surgery.  No Smoking including e-cigarettes for 24 hours prior to surgery.  No chewable tobacco products for at least 6 hours prior to surgery.  No nicotine patches on the day of surgery.  On the morning of surgery brush your teeth with toothpaste and water, you may rinse your mouth with mouthwash if you wish. Do not swallow any toothpaste or mouthwash.  Notify your doctor if there is any change in your medical condition (cold, fever, infection).  Do not wear jewelry, make-up, hairpins, clips or nail polish.  Do not wear lotions, powders, or perfumes.   Do not shave 48 hours prior to surgery.   Contacts and dentures may not be worn into surgery.  Do not bring valuables to the hospital, including drivers license, insurance or credit cards.  [facility] is not responsible for any belongings or valuables.   TAKE THESE MEDICATIONS THE MORNING OF SURGERY:  1.  Albuterol inhaler 2.  Pantoprazole (take one the night before surgery and one the  morning of surgery - helps prevent nausea after surgery)  Use CHG Soap as directed on instruction sheet.  Use inhalers on the day of surgery and bring to the hospital.  Now!  Stop Anti-inflammatories (NSAIDS) such as Advil, Aleve, Ibuprofen, Motrin, Naproxen, Naprosyn and Aspirin based products such as Excedrin, Goodys Powder, BC Powder. (May take Tylenol or Acetaminophen if needed.)  Stop ANY OVER THE COUNTER supplements until after surgery. (vitamin E) (May continue Vitamin D, Vitamin B, and multivitamin.)  Wear comfortable clothing (specific to your surgery type) to the hospital.  Plan for stool softeners for home use.  If you are being discharged the day of surgery, you will not be allowed to drive home. You will need a responsible adult to drive you home and stay with you that night.   If you are taking public transportation, you will need to have a responsible adult with you. Please confirm with your physician that it is acceptable to use public transportation.   Please call [phone] if you have any questions about these instructions.

## 2018-09-23 MED ORDER — CLINDAMYCIN PHOSPHATE 900 MG/50ML IV SOLN
900.0000 mg | Freq: Once | INTRAVENOUS | Status: AC
  Administered 2018-09-24: 900 mg via INTRAVENOUS

## 2018-09-24 ENCOUNTER — Other Ambulatory Visit: Payer: Self-pay

## 2018-09-24 ENCOUNTER — Ambulatory Visit: Payer: Medicare HMO | Admitting: Anesthesiology

## 2018-09-24 ENCOUNTER — Ambulatory Visit
Admission: RE | Admit: 2018-09-24 | Discharge: 2018-09-24 | Disposition: A | Discharge status: Home / Self Care | Payer: Medicare HMO | Source: Ambulatory Visit | Attending: Orthopedic Surgery | Admitting: Orthopedic Surgery

## 2018-09-24 ENCOUNTER — Encounter: Payer: Self-pay | Admitting: Emergency Medicine

## 2018-09-24 ENCOUNTER — Encounter
Admission: RE | Disposition: A | Discharge status: Home / Self Care | Payer: Self-pay | Source: Ambulatory Visit | Attending: Orthopedic Surgery

## 2018-09-24 DIAGNOSIS — M75112 Incomplete rotator cuff tear or rupture of left shoulder, not specified as traumatic: Secondary | ICD-10-CM | POA: Diagnosis not present

## 2018-09-24 DIAGNOSIS — M7542 Impingement syndrome of left shoulder: Secondary | ICD-10-CM | POA: Diagnosis not present

## 2018-09-24 DIAGNOSIS — K219 Gastro-esophageal reflux disease without esophagitis: Secondary | ICD-10-CM | POA: Insufficient documentation

## 2018-09-24 DIAGNOSIS — M25812 Other specified joint disorders, left shoulder: Secondary | ICD-10-CM | POA: Insufficient documentation

## 2018-09-24 DIAGNOSIS — M75122 Complete rotator cuff tear or rupture of left shoulder, not specified as traumatic: Secondary | ICD-10-CM | POA: Insufficient documentation

## 2018-09-24 DIAGNOSIS — M7532 Calcific tendinitis of left shoulder: Secondary | ICD-10-CM | POA: Insufficient documentation

## 2018-09-24 DIAGNOSIS — G8918 Other acute postprocedural pain: Secondary | ICD-10-CM | POA: Diagnosis not present

## 2018-09-24 DIAGNOSIS — M65812 Other synovitis and tenosynovitis, left shoulder: Secondary | ICD-10-CM | POA: Diagnosis not present

## 2018-09-24 DIAGNOSIS — Z79899 Other long term (current) drug therapy: Secondary | ICD-10-CM | POA: Diagnosis not present

## 2018-09-24 DIAGNOSIS — M7522 Bicipital tendinitis, left shoulder: Secondary | ICD-10-CM | POA: Insufficient documentation

## 2018-09-24 DIAGNOSIS — J45909 Unspecified asthma, uncomplicated: Secondary | ICD-10-CM | POA: Insufficient documentation

## 2018-09-24 DIAGNOSIS — M19012 Primary osteoarthritis, left shoulder: Secondary | ICD-10-CM | POA: Insufficient documentation

## 2018-09-24 DIAGNOSIS — Z87891 Personal history of nicotine dependence: Secondary | ICD-10-CM | POA: Diagnosis not present

## 2018-09-24 DIAGNOSIS — M25512 Pain in left shoulder: Secondary | ICD-10-CM | POA: Diagnosis not present

## 2018-09-24 HISTORY — PX: SHOULDER ARTHROSCOPY WITH OPEN ROTATOR CUFF REPAIR: SHX6092

## 2018-09-24 SURGERY — ARTHROSCOPY, SHOULDER WITH REPAIR, ROTATOR CUFF, OPEN
Anesthesia: General | Site: Shoulder | Laterality: Left

## 2018-09-24 MED ORDER — FENTANYL CITRATE (PF) 100 MCG/2ML IJ SOLN
50.0000 ug | Freq: Once | INTRAMUSCULAR | Status: AC
  Administered 2018-09-24: 50 ug via INTRAVENOUS

## 2018-09-24 MED ORDER — GLYCOPYRROLATE 0.2 MG/ML IJ SOLN
INTRAMUSCULAR | Status: AC
  Filled 2018-09-24: qty 1

## 2018-09-24 MED ORDER — LIDOCAINE HCL (PF) 1 % IJ SOLN
INTRAMUSCULAR | Status: DC | PRN
  Administered 2018-09-24: .8 mL via SUBCUTANEOUS

## 2018-09-24 MED ORDER — EPINEPHRINE 30 MG/30ML IJ SOLN
INTRAMUSCULAR | Status: AC
  Filled 2018-09-24: qty 1

## 2018-09-24 MED ORDER — LACTATED RINGERS IV SOLN
INTRAVENOUS | Status: DC | PRN
  Administered 2018-09-24: 14 mL

## 2018-09-24 MED ORDER — PROPOFOL 10 MG/ML IV BOLUS
INTRAVENOUS | Status: DC | PRN
  Administered 2018-09-24: 170 mg via INTRAVENOUS

## 2018-09-24 MED ORDER — BUPIVACAINE LIPOSOME 1.3 % IJ SUSP
INTRAMUSCULAR | Status: AC
  Filled 2018-09-24: qty 20

## 2018-09-24 MED ORDER — BUPIVACAINE HCL (PF) 0.5 % IJ SOLN
INTRAMUSCULAR | Status: AC
  Filled 2018-09-24: qty 10

## 2018-09-24 MED ORDER — SUGAMMADEX SODIUM 200 MG/2ML IV SOLN
INTRAVENOUS | Status: AC
  Filled 2018-09-24: qty 2

## 2018-09-24 MED ORDER — EPHEDRINE SULFATE 50 MG/ML IJ SOLN
INTRAMUSCULAR | Status: AC
  Filled 2018-09-24: qty 1

## 2018-09-24 MED ORDER — ONDANSETRON HCL 4 MG/2ML IJ SOLN: 4.0000 mg | Freq: Once | INTRAMUSCULAR | Status: DC | PRN

## 2018-09-24 MED ORDER — GLYCOPYRROLATE 0.2 MG/ML IJ SOLN
INTRAMUSCULAR | Status: DC | PRN
  Administered 2018-09-24 (×2): 0.1 mg via INTRAVENOUS

## 2018-09-24 MED ORDER — BUPIVACAINE HCL (PF) 0.25 % IJ SOLN
INTRAMUSCULAR | Status: DC | PRN
  Administered 2018-09-24: 10 mL

## 2018-09-24 MED ORDER — MIDAZOLAM HCL 2 MG/2ML IJ SOLN
2.0000 mg | Freq: Once | INTRAMUSCULAR | Status: AC
  Administered 2018-09-24: 2 mg via INTRAVENOUS

## 2018-09-24 MED ORDER — FENTANYL CITRATE (PF) 100 MCG/2ML IJ SOLN
INTRAMUSCULAR | Status: AC
  Administered 2018-09-24: 50 ug via INTRAVENOUS
  Filled 2018-09-24: qty 2

## 2018-09-24 MED ORDER — OXYCODONE HCL 5 MG PO TABS: 5.0000 mg | ORAL_TABLET | ORAL | 0 refills | Status: DC | PRN

## 2018-09-24 MED ORDER — ONDANSETRON HCL 4 MG/2ML IJ SOLN
INTRAMUSCULAR | Status: DC | PRN
  Administered 2018-09-24: 4 mg via INTRAVENOUS

## 2018-09-24 MED ORDER — DEXAMETHASONE SODIUM PHOSPHATE 10 MG/ML IJ SOLN
INTRAMUSCULAR | Status: AC
  Filled 2018-09-24: qty 1

## 2018-09-24 MED ORDER — ROCURONIUM BROMIDE 50 MG/5ML IV SOLN
INTRAVENOUS | Status: AC
  Filled 2018-09-24: qty 1

## 2018-09-24 MED ORDER — ONDANSETRON HCL 4 MG/2ML IJ SOLN
INTRAMUSCULAR | Status: AC
  Filled 2018-09-24: qty 2

## 2018-09-24 MED ORDER — EPHEDRINE SULFATE 50 MG/ML IJ SOLN
INTRAMUSCULAR | Status: DC | PRN
  Administered 2018-09-24: 5 mg via INTRAVENOUS
  Administered 2018-09-24: 10 mg via INTRAVENOUS
  Administered 2018-09-24 (×4): 5 mg via INTRAVENOUS

## 2018-09-24 MED ORDER — PHENYLEPHRINE HCL 10 MG/ML IJ SOLN
INTRAMUSCULAR | Status: AC
  Filled 2018-09-24: qty 1

## 2018-09-24 MED ORDER — ASPIRIN EC 325 MG PO TBEC: 325.0000 mg | DELAYED_RELEASE_TABLET | Freq: Every day | ORAL | 0 refills | Status: AC

## 2018-09-24 MED ORDER — FENTANYL CITRATE (PF) 100 MCG/2ML IJ SOLN: 25.0000 ug | INTRAMUSCULAR | Status: DC | PRN

## 2018-09-24 MED ORDER — LACTATED RINGERS IV SOLN
INTRAVENOUS | Status: DC
  Administered 2018-09-24: 07:00:00 via INTRAVENOUS

## 2018-09-24 MED ORDER — LIDOCAINE HCL (PF) 2 % IJ SOLN
INTRAMUSCULAR | Status: AC
  Filled 2018-09-24: qty 10

## 2018-09-24 MED ORDER — LIDOCAINE HCL (PF) 1 % IJ SOLN
INTRAMUSCULAR | Status: AC
  Filled 2018-09-24: qty 5

## 2018-09-24 MED ORDER — MIDAZOLAM HCL 2 MG/2ML IJ SOLN
INTRAMUSCULAR | Status: AC
  Administered 2018-09-24: 2 mg via INTRAVENOUS
  Filled 2018-09-24: qty 2

## 2018-09-24 MED ORDER — LIDOCAINE-EPINEPHRINE 1 %-1:100000 IJ SOLN
INTRAMUSCULAR | Status: AC
  Filled 2018-09-24: qty 1

## 2018-09-24 MED ORDER — ONDANSETRON 4 MG PO TBDP: 4.0000 mg | ORAL_TABLET | Freq: Three times a day (TID) | ORAL | 0 refills | Status: DC | PRN

## 2018-09-24 MED ORDER — ACETAMINOPHEN 500 MG PO TABS: 1000.0000 mg | ORAL_TABLET | Freq: Three times a day (TID) | ORAL | 2 refills | Status: AC

## 2018-09-24 MED ORDER — SUCCINYLCHOLINE CHLORIDE 20 MG/ML IJ SOLN
INTRAMUSCULAR | Status: AC
  Filled 2018-09-24: qty 1

## 2018-09-24 MED ORDER — EPINEPHRINE PF 1 MG/ML IJ SOLN
INTRAMUSCULAR | Status: AC
  Filled 2018-09-24: qty 1

## 2018-09-24 MED ORDER — LIDOCAINE HCL (CARDIAC) PF 100 MG/5ML IV SOSY
PREFILLED_SYRINGE | INTRAVENOUS | Status: DC | PRN
  Administered 2018-09-24: 100 mg via INTRAVENOUS

## 2018-09-24 MED ORDER — BUPIVACAINE LIPOSOME 1.3 % IJ SUSP
INTRAMUSCULAR | Status: DC | PRN
  Administered 2018-09-24: 20 mL via PERINEURAL

## 2018-09-24 MED ORDER — CLINDAMYCIN PHOSPHATE 900 MG/50ML IV SOLN
INTRAVENOUS | Status: AC
  Filled 2018-09-24: qty 50

## 2018-09-24 MED ORDER — SEVOFLURANE IN SOLN
RESPIRATORY_TRACT | Status: AC
  Filled 2018-09-24: qty 250

## 2018-09-24 MED ORDER — FENTANYL CITRATE (PF) 100 MCG/2ML IJ SOLN
INTRAMUSCULAR | Status: AC
  Filled 2018-09-24: qty 2

## 2018-09-24 MED ORDER — DEXAMETHASONE SODIUM PHOSPHATE 10 MG/ML IJ SOLN
INTRAMUSCULAR | Status: DC | PRN
  Administered 2018-09-24: 5 mg via INTRAVENOUS

## 2018-09-24 MED ORDER — SUCCINYLCHOLINE CHLORIDE 20 MG/ML IJ SOLN
INTRAMUSCULAR | Status: DC | PRN
  Administered 2018-09-24: 100 mg via INTRAVENOUS

## 2018-09-24 MED ORDER — ROCURONIUM BROMIDE 100 MG/10ML IV SOLN
INTRAVENOUS | Status: DC | PRN
  Administered 2018-09-24: 5 mg via INTRAVENOUS

## 2018-09-24 SURGICAL SUPPLY — 86 items
ADAPTER IRRIG TUBE 2 SPIKE SOL (ADAPTER) ×6 IMPLANT
ANCHOR 2.3 SP SGL 1.2 XBRAID (Anchor) ×4 IMPLANT
ANCHOR 2.3MM SP SGL 1.2 XBRAID (Anchor) ×2 IMPLANT
ANCHOR ALL-SUT Q-FIX 2.8 (Anchor) ×3 IMPLANT
ANCHOR SUT BIO SW 4.75X19.1 (Anchor) ×9 IMPLANT
ANCHOR SUT FBRTK SUTURETAP 1.3 (Anchor) ×6 IMPLANT
BIT DRILL RIGD1.8MM FBRTK STRL (DRILL) ×1 IMPLANT
BLADE OSCILLATING/SAGITTAL (BLADE)
BLADE SW THK.38XMED LNG THN (BLADE) IMPLANT
BUR BR 5.5 12 FLUTE (BURR) IMPLANT
BUR RADIUS 4.0X18.5 (BURR) IMPLANT
CANNULA 5.75X7CM (CANNULA)
CANNULA PART THRD DISP 5.75X7 (CANNULA) IMPLANT
CANNULA PARTIAL THREAD 2X7 (CANNULA) ×3 IMPLANT
CANNULA TWIST IN 8.25X9CM (CANNULA) IMPLANT
CHLORAPREP W/TINT 26ML (MISCELLANEOUS) ×3 IMPLANT
CLOSURE WOUND 1/2 X4 (GAUZE/BANDAGES/DRESSINGS)
COOLER POLAR GLACIER W/PUMP (MISCELLANEOUS) ×3 IMPLANT
COVER LIGHT HANDLE STERIS (MISCELLANEOUS) ×3 IMPLANT
COVER WAND RF STERILE (DRAPES) ×3 IMPLANT
CRADLE LAMINECT ARM (MISCELLANEOUS) ×6 IMPLANT
DERMABOND ADVANCED (GAUZE/BANDAGES/DRESSINGS)
DERMABOND ADVANCED .7 DNX12 (GAUZE/BANDAGES/DRESSINGS) IMPLANT
DRAPE IMP U-DRAPE 54X76 (DRAPES) ×6 IMPLANT
DRAPE INCISE IOBAN 66X45 STRL (DRAPES) ×3 IMPLANT
DRAPE SHEET LG 3/4 BI-LAMINATE (DRAPES) ×3 IMPLANT
DRAPE STERI 35X30 U-POUCH (DRAPES) ×3 IMPLANT
DRAPE U-SHAPE 47X51 STRL (DRAPES) ×3 IMPLANT
DRILL RIGID 1.8MM FBRTK STRL (DRILL) ×3
ELECT REM PT RETURN 9FT ADLT (ELECTROSURGICAL) ×3
ELECTRODE REM PT RTRN 9FT ADLT (ELECTROSURGICAL) ×1 IMPLANT
GAUZE PETRO XEROFOAM 1X8 (MISCELLANEOUS) ×3 IMPLANT
GAUZE SPONGE 4X4 12PLY STRL (GAUZE/BANDAGES/DRESSINGS) ×3 IMPLANT
GLOVE BIOGEL PI IND STRL 8 (GLOVE) ×1 IMPLANT
GLOVE BIOGEL PI INDICATOR 8 (GLOVE) ×2
GLOVE SURG SYN 8.0 (GLOVE) ×3 IMPLANT
GOWN STRL REUS W/ TWL LRG LVL3 (GOWN DISPOSABLE) ×1 IMPLANT
GOWN STRL REUS W/TWL LRG LVL3 (GOWN DISPOSABLE) ×2
GOWN STRL REUS W/TWL LRG LVL4 (GOWN DISPOSABLE) ×3 IMPLANT
IV LACTATED RINGER IRRG 3000ML (IV SOLUTION) ×28
IV LR IRRIG 3000ML ARTHROMATIC (IV SOLUTION) ×14 IMPLANT
KIT SPEAR STR 1.6MM DRILL (MISCELLANEOUS) ×3 IMPLANT
KIT STABILIZATION SHOULDER (MISCELLANEOUS) ×3 IMPLANT
KIT SUTURE 2.8 Q-FIX DISP (MISCELLANEOUS) ×3 IMPLANT
KIT TURNOVER KIT A (KITS) ×3 IMPLANT
MANIFOLD NEPTUNE II (INSTRUMENTS) ×3 IMPLANT
MASK FACE SPIDER DISP (MASK) ×3 IMPLANT
MAT ABSORB  FLUID 56X50 GRAY (MISCELLANEOUS) ×4
MAT ABSORB FLUID 56X50 GRAY (MISCELLANEOUS) ×2 IMPLANT
NDL SAFETY ECLIPSE 18X1.5 (NEEDLE) ×1 IMPLANT
NEEDLE HYPO 18GX1.5 SHARP (NEEDLE) ×2
NEEDLE HYPO 22GX1.5 SAFETY (NEEDLE) ×3 IMPLANT
NEEDLE MAYO 6 CRC TAPER PT (NEEDLE) IMPLANT
NEEDLE SCORPION MULTI FIRE (NEEDLE) ×3 IMPLANT
PACK ARTHROSCOPY SHOULDER (MISCELLANEOUS) ×3 IMPLANT
PAD ABD DERMACEA PRESS 5X9 (GAUZE/BANDAGES/DRESSINGS) ×3 IMPLANT
PAD WRAPON POLAR SHDR XLG (MISCELLANEOUS) ×1 IMPLANT
SET TUBE SUCT SHAVER OUTFL 24K (TUBING) ×3 IMPLANT
SET TUBE TIP INTRA-ARTICULAR (MISCELLANEOUS) ×3 IMPLANT
SLING ULTRA II M (MISCELLANEOUS) ×3 IMPLANT
STAPLER SKIN PROX 35W (STAPLE) IMPLANT
STRAP SAFETY 5IN WIDE (MISCELLANEOUS) ×3 IMPLANT
STRIP CLOSURE SKIN 1/2X4 (GAUZE/BANDAGES/DRESSINGS) IMPLANT
SUT ETHILON 3-0 (SUTURE) IMPLANT
SUT ETHILON 4-0 (SUTURE) ×2
SUT ETHILON 4-0 FS2 18XMFL BLK (SUTURE) ×1
SUT LASSO 90 DEG SD STR (SUTURE) IMPLANT
SUT MNCRL 4-0 (SUTURE)
SUT MNCRL 4-0 27XMFL (SUTURE)
SUT PROLENE 0 CT 2 (SUTURE) IMPLANT
SUT TICRON 2-0 30IN 311381 (SUTURE) IMPLANT
SUT TIGER TAPE 7 IN WHITE (SUTURE) ×3 IMPLANT
SUT VIC AB 0 CT1 36 (SUTURE) IMPLANT
SUT VIC AB 2-0 CT2 27 (SUTURE) IMPLANT
SUT VICRYL 3-0 27IN (SUTURE) IMPLANT
SUTURE ETHLN 4-0 FS2 18XMF BLK (SUTURE) ×1 IMPLANT
SUTURE MNCRL 4-0 27XMF (SUTURE) IMPLANT
SYR 10ML LL (SYRINGE) ×3 IMPLANT
TAPE CLOTH 3X10 WHT NS LF (GAUZE/BANDAGES/DRESSINGS) ×3 IMPLANT
TAPE MICROFOAM 4IN (TAPE) ×3 IMPLANT
TUBING ARTHRO INFLOW-ONLY STRL (TUBING) ×3 IMPLANT
TUBING CONNECTING 10 (TUBING) ×2 IMPLANT
TUBING CONNECTING 10' (TUBING) ×1
WAND HAND CNTRL MULTIVAC 90 (MISCELLANEOUS) IMPLANT
WAND WEREWOLF FLOW 90D (MISCELLANEOUS) ×3 IMPLANT
WRAPON POLAR PAD SHDR XLG (MISCELLANEOUS) ×3

## 2018-09-24 NOTE — Op Note (Signed)
SURGERY DATE: 09/24/2018  PRE-OP DIAGNOSIS:  1. Left rotator cuff tear (subscapularis, supraspinatus) 2. Left acromioclavicular joint osteoarthritis 3. Left subacromial impingement 4. Left biceps tendinopathy 5. Left shoulder calcific tendinitis  POST-OP DIAGNOSIS: 1. Left rotator cuff tear (subscapularis, supraspinatus, infrapinatus) 2. Left acromioclavicular joint osteoarthritis 3. Left subacromial impingement 4. Left biceps tendinopathy 5. Left shoulder calcific tendinitis  PROCEDURES:  1. Left arthroscopic rotator cuff repair (subscapularis) 2. Left mini-open rotator cuff repair (supraspinatus and infraspinatus) 3. Left open biceps tenodesis 4. Left arthroscopic distal clavicle excision 5. Left arthroscopic extensive debridement of shoulder (glenohumeral and subacromial spaces) 6. Left arthroscopic subacromial decompression  SURGEON: Cato Mulligan, MD  ASSISTANT: Anitra Lauth, PA  ANESTHESIA: Gen with Exparil interscalene block  ESTIMATED BLOOD LOSS: 25cc  DRAINS:  none  TOTAL IV FLUIDS: per anesthesia   SPECIMENS: none  IMPLANTS:  - Arthrex 4.22mm SwiveLock x 3 - Arthrex FiberTak Suture Anchor - Double Loaded - Iconix SPEED double loaded with 1.2 and 2.35mm tape x 2 - [employer] Q-fix Suture Anchor  OPERATIVE FINDINGS:  Examination under anesthesia: A careful examination under anesthesia was performed.  Passive range of motion was: FF: 160; ER at side: 45; ER in abduction: 90; IR in abduction: 50.  Anterior load shift: NT.  Posterior load shift: NT.  Sulcus in neutral: NT.  Sulcus in ER: NT.    Intra-operative findings: A thorough arthroscopic examination of the shoulder was performed.  The findings are: 1. Biceps tendon: erythema at superior labrum insertion 2. Superior labrum: injected with surrounding synovitis 3. Posterior labrum and capsule: normal 4. Inferior capsule and inferior recess: normal 5. Glenoid cartilage surface: normal 6. Supraspinatus  attachment: full-thickness tear with significant calcific deposits 7. Posterior rotator cuff attachment: no tear, but significant calcific deposits 8. Humeral head articular cartilage: normal 9. Rotator interval: significant synovitis and small areas of calcific depositis 10: Subscapularis tendon: patial-thickness tear on the articular surface involving the superior supraspinatus 11. Anterior labrum: degenerative 12. IGHL: normal  OPERATIVE REPORT:   Indications for procedure: Traci Lynn is a 67 y.o. female with over a year of L shoulder pain. She has failed non-operative management including activity modification and medical management without adequate relief of symptoms. Clinical exam and MRI were suggestive of rotator cuff tear including subscapularis and supraspinatus tears; subacromial impingement; and acromioclavicular joint arthritis. Additionally there was a significant amount of calcification noted in the region of the rotator cuff on imaging.  After discussion of risks, benefits, and alternatives to surgery, the patient elected to proceed with surgical management.   Procedure in detail:  I identified Traci Lynn in the pre-operative holding area.  I marked the operative shoulder with my initials. I reviewed the risks and benefits of the proposed surgical intervention, and the patient (and/or patient's guardian) wished to proceed.  Anesthesia was then performed with an Exparil interscalene block.  The patient was transferred to the operative suite and placed in the beach chair position.    SCDs were placed on the lower extremities. Appropriate IV antibiotics were administered prior to incision. The operative upper extremity was then prepped and draped in standard fashion. A time out was performed confirming the correct extremity, correct patient, and correct procedure.   I then created a standard posterior portal with an 11 blade. The glenohumeral joint was easily entered with a  blunt trochar and the arthroscope introduced. The findings of diagnostic arthroscopy are described above.  A standard anterior portal was made.  I debrided degenerative tissue including the synovitic tissue about the rotator interval as well as the anterior and superior labrum. I then coagulated the inflamed synovium to obtain hemostasis and reduce the risk of post-operative swelling using an Arthrocare radiofrequency device.  The subscapularis tear was identified.  A superior anterolateral portal was made under needle localization.  A 7 mm cannula was placed.  The, comma tissue indicating the superolateral border of the subscapularis was identified readily.  Tissue about the subscapularis was released anteriorly, superiorly, and posteriorly to allow for improved mobilization. The comma tissue was tagged with a FiberWire suture and tension on this allowed for appropriate mobilization of the subscapularis.  The lesser tuberosity footprint was prepared with a combination of electrocautery and an arthroscopic curette.  A Scorpion suture passing device was used to pass a FiberTape in a mattress fashion through the subscapularis tendon.  These were passed from the anterolateral portal and after passage the sutures were retrieved from the anterior portal.  These were passed through a 4.75 mm SwiveLock and placed into the lesser tuberosity at the footprint of the subscapularis tendon with the arm in a neutral position.  This appropriately reduced the subscapularis tear.  The arm was then internally and externally rotated and the subscapularis was noted to move appropriately with rotation.  Next, the arthroscope was then introduced into the subacromial space. A direct lateral portal was created with an 11-blade after spinal needle localization. An extensive subacromial bursectomy was performed using a combination of the shaver and Arthrocare wand. The entire acromial undersurface was exposed and the CA ligament was  subperiosteally elevated to expose the anterior acromial hook. A 5.57mm barrel burr was used to create a flat anterior and lateral aspect of the acromion, converting it from a Type 2 to a Type 1 acromion. Care was made to keep the deltoid fascia intact.  I then turned my attention to the arthroscopic distal clavicle excision. I identified the acromioclavicular joint. Surrounding bursal tissue was debrided and the edges of the joint were identified. I used the 5.31mm barrel burr to remove the distal clavicle parallel to the edge of the acromion. I was able to fit two widths of the burr into the space between the distal clavicle and acromion, signifying that I had removed ~29mm of distal clavicle. This was confirmed by viewing anteriorly and introducing a probe with measuring marks from the lateral portal. Hemostasis was achieved with an Arthrocare wand. Fluid was evacuated from the shoulder.  The supraspinatus tear was visualized and large calcific deposits within the supraspinatus and infraspinatus were seen. Decision was made to proceed with a mini-open approach.  A longitudinal incision from the anterolateral acromion ~6cm in length was made overlying the raphe between the anterior and middle heads of the deltoid. This incision also incorporated the anterolateral portal.  The raphe was identified and it was incised. The subacromial space was identified. Any remaining bursa was excised. The suprapsinatus tear was identified.  We then turned our attention to the biceps tenodesis. The arm was externally rotated.  The bicipital groove was identified.  A 15 blade was used to make a cut overlying the biceps tendon, and the tendon was removed using a right angle clamp.  The base of the bicipital groove was identified and cleared of soft tissue.  A FiberTak anchor was placed in the bicipital groove.  The biceps tendon was held at the appropriate amount of tension.  One set of sutures was passed through the biceps  anchor with one limb passed in a simple fashion and the second limb passed in a simple plus locking stitch pattern.  This was repeated for the other set of sutures.  This construct allowed for shuttling the biceps tendon down to the bone. However, upon attempting to tie these, the anchor pulled out. Given this, a Qfix anchor was placed using the same anchor hole. The sutures were passed as described above. Sutures were tied. This allowed for excellent fixation.  The sutures were tied and cut.  The diseased portion of the proximal biceps was then excised.  The arm was then internally rotated.  The significant amounts of calcium deposits within the rotator cuff tendon were noted and to properly remove these deposits, a significant portion of the rotator cuff involving the supraspinatus and part of the infraspinatus was removed sharply with a 15 blade.  This resulted in an L-shaped tear with the long limb posterior.  The rotator cuff footprint was cleared of soft tissue and the footprint was smoothed and bleeding bone over the footprint was created with a rongeur.  Two Iconix SPEED anchors were placed just lateral to the articular margin, one anteriorly, and one posteriorly.  Given the anterior to posterior distance of the rotator cuff tear, I placed a third medial row anchor using an Arthrex fiber tack double loaded with 1.3 mm tapes.  This was placed in between the 2 previously placed Iconix SPEED anchors.  The rotator cuff was held in a reduced position and all limbs of suture were passed appropriately with a scorpion suture passer. Two SwiveLock anchors were placed for the lateral row anchors with one limb of each of the six medial row sutures passed through each anchor.  This allowed for excellent reapproximation and compression of the rotator cuff over its footprint. There was a small dog ear so the FiberWire from the anterior lateral row SwiveLock was passed in a mattress fashion and tied. This reduced the dog  ear well. The construct was stable with external and internal rotation.  The wound was thoroughly irrigated.  The deltoid split was closed with 0 Vicryl.  The subdermal layer was closed with 2-0 Vicryl.  The skin was closed with 4-0 Monocryl and Dermabond. The portals were closed with 3-0 Nylon. Xeroform was applied to the incisions. A sterile dressing was applied, followed by a Polar Care sleeve and a SlingShot shoulder immobilizer/sling. The patient was awakened from anesthesia without difficulty and was transferred to the PACU in stable condition.     COMPLICATIONS: none  DISPOSITION: plan for discharge home after recovery in PACU   POSTOPERATIVE PLAN: Remain in sling (except hygiene and elbow/wrist/hand RoM exercises as instructed by PT) x 6 weeks and NWB for this time. PT to begin 3-4 days after surgery.  Use large rotator cuff repair rehab protocol with subscapularis repair.  ASA 325mg  daily x 2 weeks for DVT ppx.

## 2018-09-24 NOTE — Anesthesia Post-op Follow-up Note (Signed)
Anesthesia QCDR form completed.        

## 2018-09-24 NOTE — Transfer of Care (Signed)
Immediate Anesthesia Transfer of Care Note  Patient: Traci Lynn  Procedure(s) Performed: SHOULDER ARTHROSCOPY WITH OPEN ROTATOR CUFF REPAIR (Left Shoulder)  Patient Location: PACU  Anesthesia Type:General  Level of Consciousness: awake  Airway & Oxygen Therapy: Patient Spontanous Breathing  Post-op Assessment: Report given to RN  Post vital signs: stable  Last Vitals:  Vitals Value Taken Time  BP 120/70 09/24/2018 11:50 AM  Temp    Pulse 89 09/24/2018 11:53 AM  Resp 18 09/24/2018 11:53 AM  SpO2 95 % 09/24/2018 11:53 AM  Vitals shown include unvalidated device data.  Last Pain:  Vitals:   09/24/18 0721  TempSrc:   PainSc: 0-No pain         Complications: No apparent anesthesia complications

## 2018-09-24 NOTE — Anesthesia Preprocedure Evaluation (Signed)
Anesthesia Evaluation  Patient identified by MRN, date of birth, ID band Patient awake    Reviewed: Allergy & Precautions, NPO status , Patient's Chart, lab work & pertinent test results, reviewed documented beta blocker date and time   History of Anesthesia Complications Negative for: history of anesthetic complications  Airway Mallampati: III       Dental   Pulmonary asthma , neg sleep apnea, neg COPD, former smoker,           Cardiovascular (-) hypertension(-) Past MI and (-) CHF + dysrhythmias (hx of A fib, no problems recently) Atrial Fibrillation (-) Valvular Problems/Murmurs     Neuro/Psych neg Seizures    GI/Hepatic Neg liver ROS, GERD  Medicated and Controlled,  Endo/Other  neg diabetes  Renal/GU negative Renal ROS     Musculoskeletal   Abdominal   Peds  Hematology   Anesthesia Other Findings   Reproductive/Obstetrics                             Anesthesia Physical Anesthesia Plan  ASA: II  Anesthesia Plan: General   Post-op Pain Management: GA combined w/ Regional for post-op pain   Induction: Intravenous  PONV Risk Score and Plan: 3 and Dexamethasone, Ondansetron and Midazolam  Airway Management Planned: Oral ETT  Additional Equipment:   Intra-op Plan:   Post-operative Plan:   Informed Consent: I have reviewed the patients History and Physical, chart, labs and discussed the procedure including the risks, benefits and alternatives for the proposed anesthesia with the patient or authorized representative who has indicated his/her understanding and acceptance.     Plan Discussed with:   Anesthesia Plan Comments:         Anesthesia Quick Evaluation

## 2018-09-24 NOTE — Anesthesia Procedure Notes (Signed)
Procedure Name: Intubation Date/Time: 09/24/2018 7:40 AM Performed by: Zetta Bills, CRNA Pre-anesthesia Checklist: Patient identified, Emergency Drugs available, Suction available and Patient being monitored Patient Re-evaluated:Patient Re-evaluated prior to induction Oxygen Delivery Method: Circle system utilized Preoxygenation: Pre-oxygenation with 100% oxygen Induction Type: IV induction Ventilation: Mask ventilation without difficulty Laryngoscope Size: Mac and 3 Grade View: Grade I Tube type: Oral Tube size: 7.0 mm Number of attempts: 1 Airway Equipment and Method: Stylet Placement Confirmation: ETT inserted through vocal cords under direct vision Secured at: 20 cm Tube secured with: Tape Dental Injury: Teeth and Oropharynx as per pre-operative assessment

## 2018-09-24 NOTE — Anesthesia Postprocedure Evaluation (Signed)
Anesthesia Post Note  Patient: Traci Lynn  Procedure(s) Performed: SHOULDER ARTHROSCOPY WITH OPEN ROTATOR CUFF REPAIR (Left Shoulder)  Patient location during evaluation: PACU Anesthesia Type: General Level of consciousness: awake and alert Pain management: pain level controlled Vital Signs Assessment: post-procedure vital signs reviewed and stable Respiratory status: spontaneous breathing and respiratory function stable Cardiovascular status: stable Anesthetic complications: no     Last Vitals:  Vitals:   09/24/18 0721 09/24/18 1150  BP: (!) 141/77   Pulse: 83   Resp: 20   Temp:  (!) 35.9 C  SpO2: 96%     Last Pain:  Vitals:   09/24/18 1150  TempSrc:   PainSc: 0-No pain                 Joffrey Kerce K

## 2018-09-24 NOTE — Discharge Instructions (Addendum)
Post-Op Instructions - Rotator Cuff Repair  1. Bracing: You will wear a shoulder immobilizer or sling for 6 weeks.   2. Driving: No driving for 3 weeks post-op. When driving, do not wear the immobilizer. Ideally, we recommend no driving for 6 weeks while sling is in place as one arm will be immobilized.   3. Activity: No active lifting for 2 months. Wrist, hand, and elbow motion only. Avoid lifting the upper arm away from the body except for hygiene. You are permitted to bend and straighten the elbow passively only (no active elbow motion). You may use your hand and wrist for typing, writing, and managing utensils (cutting food). Do not lift more than a coffee cup for 8 weeks.  When sleeping or resting, inclined positions (recliner chair or wedge pillow) and a pillow under the forearm for support may provide better comfort for up to 4 weeks.  Avoid long distance travel for 4 weeks.  Return to normal activities after rotator cuff repair repair normally takes 6 months on average. If rehab goes very well, may be able to do most activities at 4 months, except overhead or contact sports.  4. Physical Therapy: Begins 3-4 days after surgery, and proceed 1 time per week for the first 6 weeks, then 1-2 times per week from weeks 6-20 post-op.  5. Medications:  - You will be provided a prescription for narcotic pain medicine. After surgery, take 1-2 narcotic tablets every 4 hours if needed for severe pain.  - A prescription for anti-nausea medication will be provided in case the narcotic medicine causes nausea - take 1 tablet every 6 hours only if nauseated.   - Take tylenol 1000 mg (2 Extra Strength tablets or 3 regular strength) every 8 hours for pain.  May decrease or stop tylenol 5 days after surgery if you are having minimal pain. - Take ASA 325mg/day x 2 weeks to help prevent DVTs/PEs (blood clots).  - DO NOT take ANY nonsteroidal anti-inflammatory pain medications (Advil, Motrin, Ibuprofen, Aleve,  Naproxen, or Naprosyn). These medicines can inhibit healing of your shoulder repair.    If you are taking prescription medication for anxiety, depression, insomnia, muscle spasm, chronic pain, or for attention deficit disorder, you are advised that you are at a higher risk of adverse effects with use of narcotics post-op, including narcotic addiction/dependence, depressed breathing, death. If you use non-prescribed substances: alcohol, marijuana, cocaine, heroin, methamphetamines, etc., you are at a higher risk of adverse effects with use of narcotics post-op, including narcotic addiction/dependence, depressed breathing, death. You are advised that taking > 50 morphine milligram equivalents (MME) of narcotic pain medication per day results in twice the risk of overdose or death. For your prescription provided: oxycodone 5 mg - taking more than 6 tablets per day would result in > 50 morphine milligram equivalents (MME) of narcotic pain medication. Be advised that we will prescribe narcotics short-term, for acute post-operative pain only - 3 weeks for major operations such as shoulder repair/reconstruction surgeries.     6. Post-Op Appointment:  Your first post-op appointment will be 10-14 days post-op.  7. Work or School: For most, but not all procedures, we advise staying out of work or school for at least 1 to 2 weeks in order to recover from the stress of surgery and to allow time for healing.   If you need a work or school note this can be provided.   8. Smoking: If you are a smoker, you need to refrain from   smoking in the postoperative period. The nicotine in cigarettes will inhibit healing of your shoulder repair and decrease the chance of successful repair. Similarly, nicotine containing products (gum, patches) should be avoided.  ° °Post-operative Brace: °Apply and remove the brace you received as you were instructed to at the time of fitting and as described in detail as the brace’s  instructions for use indicate.  Wear the brace for the period of time prescribed by your physician.  The brace can be cleaned with soap and water and allowed to air dry only.  Should the brace result in increased pain, decreased feeling (numbness/tingling), increased swelling or an overall worsening of your medical condition, please contact your doctor immediately.  If an emergency situation occurs as a result of wearing the brace after normal business hours, please dial 911 and seek immediate medical attention.  Let your doctor know if you have any further questions about the brace issued to you. °Refer to the shoulder sling instructions for use if you have any questions regarding the correct fit of your shoulder sling.  °[facility] Customer Care for Troubleshooting: [phone] ° °Video that illustrates how to properly use a shoulder sling: °"Instructions for Proper Use of an Orthopaedic Sling" °https://www.youtube.com/watch?v=AHZpn_Xo45w ° ° ° °AMBULATORY SURGERY  °DISCHARGE INSTRUCTIONS ° ° °1) The drugs that you were given will stay in your system until tomorrow so for the next 24 hours you should not: ° °A) Drive an automobile °B) Make any legal decisions °C) Drink any alcoholic beverage ° ° °2) You may resume regular meals tomorrow.  Today it is better to start with liquids and gradually work up to solid foods. ° °You may eat anything you prefer, but it is better to start with liquids, then soup and crackers, and gradually work up to solid foods. ° ° °3) Please notify your doctor immediately if you have any unusual bleeding, trouble breathing, redness and pain at the surgery site, drainage, fever, or pain not relieved by medication. ° ° ° °4) Additional Instructions: ° ° ° ° ° ° ° °Please contact your physician with any problems or Same Day Surgery at [phone], Monday through Friday 6 am to 4 pm, or [place] at  Main number at [phone]. ° ° ° °Interscalene Nerve Block, Care After °This sheet  gives you information about how to care for yourself after your procedure. Your health care provider may also give you more specific instructions. If you have problems or questions, contact your health care provider. °What can I expect after the procedure? °After the procedure, it is common to have: °· Soreness or tenderness in your neck. °· Numbness in your shoulder, upper arm, and some fingers. °· Weakness in your shoulder and arm muscles. ° °The feeling and strength in your shoulder, arm, and fingers should return to normal within hours after your procedure. °Follow these instructions at home: °For at least 24 hours after the procedure: °· Do not: °? Participate in activities in which you could fall or become injured. °? Drive. °? Use heavy machinery. °? Drink alcohol. °? Take sleeping pills or medicines that cause drowsiness. °? Make important decisions or sign legal documents. °? Take care of children on your own. °· Rest. °Eating and drinking °· If you vomit, drink water, juice, or soup when you can drink without vomiting. °· Make sure you have little or no nausea before eating solid foods. °· Follow the diet that is recommended by your health care provider. °If you have a   sling: °· Wear it as told by your health care provider. Remove it only as told by your health care provider. °· Loosen the sling if your fingers tingle, become numb, or turn cold and blue. °· Make sure that your entire arm, including your wrist, is supported. Do not allow your wrist to dangle over the end of the sling. °· Do not let your sling get wet if it is not waterproof. °· Keep the sling clean. °Bathing °· Do not take baths, swim, or use a hot tub until your health care provider approves. °· If you have a nerve block catheter in place, keep the incision site and tubing dry. °Injection site care °· Wash your hands with soap and water before you change your bandage (dressing). If soap and water are not available, use hand  sanitizer. °· Change your dressing as told by your health care provider. °· Keep your dressing dry. °· Check your nerve block injection site every day for signs of infection. Check for: °? Redness, swelling, or pain. °? Fluid or blood. °? Warmth. °Activity °· Do not perform complex or risky activities while taking prescription pain medicine and until you have fully recovered. °· Return to your normal activities as told by your health care provider and as you can tolerate them. Ask your health care provider what activities are safe for you. °· Rest and take it easy. This will help you heal and recover more quickly and fully. °· Be very cautious until you have regained strength and sensation. °General instructions °· Have a responsible adult stay with you until you are awake and alert. °· Do not drive or use heavy machinery while taking prescription pain medicine and until you have fully recovered. Ask your health care provider when it is safe to drive. °· Take over-the-counter and prescription medicines only as told by your health care provider. °· If you smoke, do not smoke without supervision. °· Do not expose your arm or shoulder to very cold or very hot temperatures until you have full feeling back. °· If you have a nerve block catheter in place: °? Try to keep the catheter from getting kinked or pinched. °? Avoid pulling or tugging on the catheter. °· Keep all follow-up visits as told by your health care provider. This is important. °Contact a health care provider if: °· You have chills or fever. °· You have redness, swelling, or pain around your injection site. °· You have fluid or blood coming from the injection site. °· The skin around the injection site is warm to the touch. °· There is a bad smell coming from your dressing. °· You have hoarseness or a drooping or dry eye that lasts more than a few days. °· You have pain that is poorly controlled with the block or with pain medicine. °· You have numbness,  tingling, or weakness in your shoulder or arm that lasts for more than one week. °Get help right away if: °· You have severe pain. °· You lose or do not regain strength and sensation in your arm even after the nerve block medicine has stopped. °· You have trouble breathing. °· You have a nerve block catheter still in place and you begin to shiver. °· You have a nerve block catheter still in place and you are getting more and more numb or weak. °This information is not intended to replace advice given to you by your health care provider. Make sure you discuss any questions you have with   your health care provider. °Document Released: 11/16/2015 Document Revised: 07/25/2016 Document Reviewed: 07/25/2016 °Elsevier Interactive Patient Education © 2018 [employer] ° °

## 2018-09-24 NOTE — H&P (Signed)
Paper H&P to be scanned into permanent record. H&P reviewed. No significant changes noted.  

## 2018-09-24 NOTE — Anesthesia Procedure Notes (Signed)
Anesthesia Regional Block: Interscalene brachial plexus block   Pre-Anesthetic Checklist: ,, timeout performed, Correct Patient, Correct Site, Correct Laterality, Correct Procedure, Correct Position, site marked, Risks and benefits discussed,  Surgical consent,  Pre-op evaluation,  At surgeon's request and post-op pain management  Laterality: Left  Prep: chloraprep       Needles:  Injection technique: Single-shot  Needle Type: Echogenic Stimulator Needle     Needle Length: 9cm  Needle Gauge: 21   Needle insertion depth: 7 cm   Additional Needles:   Procedures:, nerve stimulator,,, ultrasound used (permanent image in chart),,,,   Nerve Stimulator or Paresthesia:  Response: biceps flexion, 0.8 mA,   Additional Responses:   Narrative:  Start time: 09/24/2018 7:05 AM End time: 09/24/2018 7:13 AM Injection made incrementally with aspirations every 5 mL.  Performed by: Personally   Additional Notes: Functioning IV was confirmed and monitors were applied.  A 34mm 22ga Stimuplex needle was used. Sterile prep and drape,hand hygiene and sterile gloves were used.  Negative aspiration and negative test dose prior to incremental administration of local anesthetic. The patient tolerated the procedure well.

## 2018-09-27 ENCOUNTER — Encounter: Payer: Self-pay | Admitting: Orthopedic Surgery

## 2018-09-27 DIAGNOSIS — Z9889 Other specified postprocedural states: Secondary | ICD-10-CM | POA: Diagnosis not present

## 2018-10-22 DIAGNOSIS — Z9889 Other specified postprocedural states: Secondary | ICD-10-CM | POA: Diagnosis not present

## 2018-10-29 DIAGNOSIS — Z9889 Other specified postprocedural states: Secondary | ICD-10-CM | POA: Diagnosis not present

## 2018-11-02 DIAGNOSIS — Z9889 Other specified postprocedural states: Secondary | ICD-10-CM | POA: Diagnosis not present

## 2018-11-03 DIAGNOSIS — M75112 Incomplete rotator cuff tear or rupture of left shoulder, not specified as traumatic: Secondary | ICD-10-CM | POA: Diagnosis not present

## 2018-11-08 DIAGNOSIS — Z9889 Other specified postprocedural states: Secondary | ICD-10-CM | POA: Diagnosis not present

## 2018-11-15 DIAGNOSIS — Z9889 Other specified postprocedural states: Secondary | ICD-10-CM | POA: Diagnosis not present

## 2018-11-19 DIAGNOSIS — Z9889 Other specified postprocedural states: Secondary | ICD-10-CM | POA: Diagnosis not present

## 2018-11-22 DIAGNOSIS — Z9889 Other specified postprocedural states: Secondary | ICD-10-CM | POA: Diagnosis not present

## 2018-11-24 DIAGNOSIS — B309 Viral conjunctivitis, unspecified: Secondary | ICD-10-CM | POA: Diagnosis not present

## 2018-11-24 DIAGNOSIS — J209 Acute bronchitis, unspecified: Secondary | ICD-10-CM | POA: Diagnosis not present

## 2018-11-29 DIAGNOSIS — Z9889 Other specified postprocedural states: Secondary | ICD-10-CM | POA: Diagnosis not present

## 2018-12-02 DIAGNOSIS — Z9889 Other specified postprocedural states: Secondary | ICD-10-CM | POA: Diagnosis not present

## 2018-12-06 DIAGNOSIS — Z9889 Other specified postprocedural states: Secondary | ICD-10-CM | POA: Diagnosis not present

## 2018-12-10 DIAGNOSIS — Z9889 Other specified postprocedural states: Secondary | ICD-10-CM | POA: Diagnosis not present

## 2018-12-13 DIAGNOSIS — Z9889 Other specified postprocedural states: Secondary | ICD-10-CM | POA: Diagnosis not present

## 2018-12-15 DIAGNOSIS — J209 Acute bronchitis, unspecified: Secondary | ICD-10-CM | POA: Diagnosis not present

## 2018-12-17 DIAGNOSIS — Z9889 Other specified postprocedural states: Secondary | ICD-10-CM | POA: Diagnosis not present

## 2018-12-20 DIAGNOSIS — Z9889 Other specified postprocedural states: Secondary | ICD-10-CM | POA: Diagnosis not present

## 2018-12-24 DIAGNOSIS — Z9889 Other specified postprocedural states: Secondary | ICD-10-CM | POA: Diagnosis not present

## 2018-12-27 DIAGNOSIS — Z9889 Other specified postprocedural states: Secondary | ICD-10-CM | POA: Diagnosis not present

## 2019-01-03 DIAGNOSIS — Z9889 Other specified postprocedural states: Secondary | ICD-10-CM | POA: Diagnosis not present

## 2019-01-06 DIAGNOSIS — Z9889 Other specified postprocedural states: Secondary | ICD-10-CM | POA: Diagnosis not present

## 2019-02-03 ENCOUNTER — Ambulatory Visit
Admission: EM | Admit: 2019-02-03 | Discharge: 2019-02-03 | Disposition: A | Discharge status: Home / Self Care | Payer: Medicare HMO | Attending: Family Medicine | Admitting: Family Medicine

## 2019-02-03 ENCOUNTER — Encounter: Payer: Self-pay | Admitting: Emergency Medicine

## 2019-02-03 ENCOUNTER — Other Ambulatory Visit: Payer: Self-pay

## 2019-02-03 DIAGNOSIS — J0100 Acute maxillary sinusitis, unspecified: Secondary | ICD-10-CM | POA: Diagnosis not present

## 2019-02-03 DIAGNOSIS — B9789 Other viral agents as the cause of diseases classified elsewhere: Secondary | ICD-10-CM

## 2019-02-03 LAB — RAPID STREP SCREEN (MED CTR MEBANE ONLY): Streptococcus, Group A Screen (Direct): NEGATIVE

## 2019-02-03 MED ORDER — DOXYCYCLINE HYCLATE 100 MG PO CAPS: 100.0000 mg | ORAL_CAPSULE | Freq: Two times a day (BID) | ORAL | 0 refills | Status: DC

## 2019-02-03 MED ORDER — BENZONATATE 200 MG PO CAPS: ORAL_CAPSULE | ORAL | 0 refills | Status: DC

## 2019-02-03 MED ORDER — HYDROCOD POLST-CPM POLST ER 10-8 MG/5ML PO SUER: 5.0000 mL | Freq: Two times a day (BID) | ORAL | 0 refills | Status: DC

## 2019-02-03 NOTE — Discharge Instructions (Addendum)
Drink plenty of fluids.  Rest as much as possible.  Use Tylenol or Motrin for fever chills or body aches.  °

## 2019-02-03 NOTE — ED Provider Notes (Signed)
[facility]    CSN: [id number] Arrival date & time: 02/03/19  1617     History   Chief Complaint Chief Complaint  Patient presents with  . Sore Throat    APPT  . Otalgia  . Nasal Congestion    HPI Traci Lynn is a 68 y.o. female.   HPI  68 year old female presents today with a cough sore throat nasal congestion left ear pain left-sided teeth pain and fever of 100.8 that she is had for 2 to 3 days.  Last dose of Tylenol was approximate 3 PM today.  Her temperature is 99.7 after the Tylenol.  Pulse rate is 89 blood pressure 137/86 respirations 18 O2 sats 100% on room air.          Past Medical History:  Diagnosis Date  . Asthma   . Dysrhythmia   . GERD (gastroesophageal reflux disease)   . Lumbago   . Paroxysmal atrial tachycardia ([place])   . Tubular adenoma of colon     Patient Active Problem List   Diagnosis Date Noted  . Left arm pain 01/20/2014    Past Surgical History:  Procedure Laterality Date  . ABDOMINAL HYSTERECTOMY  1979  . CARPAL TUNNEL RELEASE Left 1997  . CATARACT EXTRACTION W/PHACO Right 04/15/2017   Procedure: CATARACT EXTRACTION PHACO AND INTRAOCULAR LENS PLACEMENT (IOC);  Surgeon: Estill Cotta, MD;  Location: [facility] ORS;  Service: Ophthalmology;  Laterality: Right;  Lot# [id number] H [place]: 01:02.8 AP%: 19.7 CDE: 25.43  . CATARACT EXTRACTION W/PHACO Left 05/13/2017   Procedure: CATARACT EXTRACTION PHACO AND INTRAOCULAR LENS PLACEMENT (IOC);  Surgeon: Estill Cotta, MD;  Location: [facility] ORS;  Service: Ophthalmology;  Laterality: Left;  [place] 1:07.6 AP% 22.9 CDE 31.29 Fluid Pack lot # [id number]  . COLONOSCOPY WITH PROPOFOL N/A 10/09/2017   Procedure: COLONOSCOPY WITH PROPOFOL;  Surgeon: Lollie Sails, MD;  Location: [facility] ENDOSCOPY;  Service: Endoscopy;  Laterality: N/A;  . ESOPHAGOGASTRODUODENOSCOPY (EGD) WITH PROPOFOL N/A 10/09/2017   Procedure: ESOPHAGOGASTRODUODENOSCOPY (EGD) WITH PROPOFOL;  Surgeon: Lollie Sails, MD;   Location: [facility] ENDOSCOPY;  Service: Endoscopy;  Laterality: N/A;  . ESOPHAGOGASTRODUODENOSCOPY (EGD) WITH PROPOFOL N/A 12/22/2017   Procedure: ESOPHAGOGASTRODUODENOSCOPY (EGD) WITH PROPOFOL;  Surgeon: Lollie Sails, MD;  Location: [facility] ENDOSCOPY;  Service: Endoscopy;  Laterality: N/A;  . EYE SURGERY    . SHOULDER ARTHROSCOPY WITH OPEN ROTATOR CUFF REPAIR Left 09/24/2018   Procedure: SHOULDER ARTHROSCOPY WITH OPEN ROTATOR CUFF REPAIR;  Surgeon: Leim Fabry, MD;  Location: [facility] ORS;  Service: Orthopedics;  Laterality: Left;  . TMJ ARTHROPLASTY    . TONSILLECTOMY AND ADENOIDECTOMY      OB History   No obstetric history on file.      Home Medications    Prior to Admission medications   Medication Sig Start Date End Date Taking? Authorizing Provider  acetaminophen (TYLENOL) 500 MG tablet Take 2 tablets (1,000 mg total) by mouth every 8 (eight) hours. 09/24/18 09/24/19 Yes Leim Fabry, MD  albuterol (PROVENTIL HFA;VENTOLIN HFA) 108 (90 Base) MCG/ACT inhaler Inhale 1 puff into the lungs every 6 (six) hours as needed for wheezing or shortness of breath.    Yes [provider]  Ascorbic Acid (VITAMIN C PO) Take 1 tablet by mouth daily.   Yes [provider]  Calcium Carbonate-Vitamin D (CALCIUM 600+D PO) Take 1 tablet by mouth 2 (two) times daily.    Yes [provider]  cetirizine (ZYRTEC) 10 MG tablet Take 10 mg by mouth daily.   Yes  [provider]  Cholecalciferol (VITAMIN D3) 5000 units CAPS Take 5,000 Units by mouth daily.   Yes [provider]  EPINEPHrine (EPI-PEN) 0.3 mg/0.3 mL SOAJ injection Inject 0.3 mg into the muscle once.    Yes [provider]  fluticasone (FLONASE) 50 MCG/ACT nasal spray Place 1 spray into both nostrils daily as needed for allergies.    Yes [provider]  metoprolol tartrate (LOPRESSOR) 25 MG tablet Take 50 mg by mouth at bedtime.   Yes [provider]  pantoprazole (PROTONIX) 40 MG  tablet Take 40 mg by mouth daily before breakfast.   Yes [provider]  VITAMIN E PO Take 1 capsule by mouth daily.   Yes [provider]  benzonatate (TESSALON) 200 MG capsule Take one cap TID PRN cough 02/03/19   Lorin Picket, PA-C  chlorpheniramine-HYDROcodone [facility]) 10-8 MG/5ML SUER Take 5 mLs by mouth 2 (two) times daily. 02/03/19   Lorin Picket, PA-C  doxycycline (VIBRAMYCIN) 100 MG capsule Take 1 capsule (100 mg total) by mouth 2 (two) times daily. 02/03/19   Lorin Picket, PA-C    Family History Family History  Problem Relation Age of Onset  . Kidney disease Father        Deceased, 83  . Colon cancer Mother        Deceased 65  . Healthy Son   . Breast cancer Neg Hx     Social History Social History   Tobacco Use  . Smoking status: Former Smoker    Types: Cigarettes    Last attempt to quit: 06/01/1971    Years since quitting: 47.7  . Smokeless tobacco: Never Used  Substance Use Topics  . Alcohol use: No  . Drug use: No     Allergies   Pork-derived products; Penicillin v potassium; Shellfish allergy; and Penicillins   Review of Systems Review of Systems  Constitutional: Positive for activity change, chills, fatigue and fever.  HENT: Positive for congestion, ear pain, facial swelling, postnasal drip, sinus pressure and sinus pain.   Respiratory: Positive for cough.   All other systems reviewed and are negative.    Physical Exam Triage Vital Signs ED Triage Vitals  Enc Vitals Group     BP 02/03/19 1635 137/86     Pulse Rate 02/03/19 1635 89     Resp 02/03/19 1635 18     Temp 02/03/19 1635 99.7 F (37.6 C)     Temp Source 02/03/19 1635 Oral     SpO2 02/03/19 1635 100 %     Weight 02/03/19 1636 165 lb (74.8 kg)     Height 02/03/19 1636 5\' 6"  (1.676 m)     Head Circumference --      Peak Flow --      Pain Score 02/03/19 1636 5     Pain Loc --      Pain Edu? --      Excl. in [place]? --    No data  found.  Updated Vital Signs BP 137/86 (BP Location: Left Arm)   Pulse 89   Temp 99.7 F (37.6 C) (Oral)   Resp 18   Ht 5\' 6"  (1.676 m)   Wt 165 lb (74.8 kg)   SpO2 100%   BMI 26.63 kg/m   Visual Acuity Right Eye Distance:   Left Eye Distance:   Bilateral Distance:    Right Eye Near:   Left Eye Near:    Bilateral Near:  Physical Exam Vitals signs and nursing note reviewed.  Constitutional:      General: She is not in acute distress.    Appearance: She is well-developed. She is not ill-appearing, toxic-appearing or diaphoretic.  HENT:     Head: Normocephalic and atraumatic.     Right Ear: Tympanic membrane and ear canal normal.     Left Ear: Tympanic membrane and ear canal normal.     Nose: Congestion and rhinorrhea present.     Mouth/Throat:     Mouth: Mucous membranes are moist. No oral lesions.     Pharynx: Oropharynx is clear. No pharyngeal swelling, oropharyngeal exudate, posterior oropharyngeal erythema or uvula swelling.     Tonsils: No tonsillar exudate or tonsillar abscesses.  Eyes:     Conjunctiva/sclera: Conjunctivae normal.  Neck:     Musculoskeletal: Normal range of motion and neck supple.  Pulmonary:     Effort: Pulmonary effort is normal.     Breath sounds: Normal breath sounds.  Lymphadenopathy:     Cervical: No cervical adenopathy.  Skin:    General: Skin is warm and dry.  Neurological:     General: No focal deficit present.     Mental Status: She is alert and oriented to person, place, and time.  Psychiatric:        Mood and Affect: Mood normal.        Behavior: Behavior normal.      UC Treatments / Results  Labs (all labs ordered are listed, but only abnormal results are displayed) Labs Reviewed  RAPID STREP SCREEN ([facility] ONLY)  CULTURE, GROUP A STREP [facility])    EKG None  Radiology No results found.  Procedures Procedures (including critical care time)  Medications Ordered in UC Medications - No data to  display  Initial Impression / Assessment and Plan / UC Course  I have reviewed the triage vital signs and the nursing notes.  Pertinent labs & imaging results that were available during my care of the patient were reviewed by me and considered in my medical decision making (see chart for details).   68 year old female presents with symptoms of acute maxillary sinusitis with left-sided tenderness and teeth pain along with fever.  Also been coughing.  Will prescribe doxycycline for the sinusitis that she will use Flonase daily for the next 2 to 3 weeks.  I provided her with Ladona Ridgel for daytime use and Tussionex for nighttime for rest.  Given appropriate precautions.  If she does not improve she needs to follow-up with her primary care physician   Final Clinical Impressions(s) / UC Diagnoses   Final diagnoses:  Acute non-recurrent maxillary sinusitis     Discharge Instructions     Drink plenty of fluids.  Rest as much as possible.  Use Tylenol or Motrin for fever chills or body aches.    ED Prescriptions    Medication Sig Dispense Auth. Provider   doxycycline (VIBRAMYCIN) 100 MG capsule Take 1 capsule (100 mg total) by mouth 2 (two) times daily. 20 capsule Crecencio Mc P, PA-C   benzonatate (TESSALON) 200 MG capsule Take one cap TID PRN cough 30 capsule Lorin Picket, PA-C   chlorpheniramine-HYDROcodone (TUSSIONEX PENNKINETIC ER) 10-8 MG/5ML SUER Take 5 mLs by mouth 2 (two) times daily. 115 mL Lorin Picket, PA-C     Controlled Substance Prescriptions [place] Controlled Substance Registry consulted? Not Applicable   Lorin Picket, PA-C 02/03/19 1743

## 2019-02-03 NOTE — ED Triage Notes (Signed)
Patient in today c/o cough, sore throat, nasal congestion, left ear pain and fever (100.8) x 2-3 days. Patient's last dose of Tylenol was ~3:00pm today.

## 2019-02-06 LAB — CULTURE, GROUP A STREP (THRC)

## 2019-02-16 DIAGNOSIS — M7532 Calcific tendinitis of left shoulder: Secondary | ICD-10-CM | POA: Diagnosis not present

## 2019-03-17 DIAGNOSIS — Z136 Encounter for screening for cardiovascular disorders: Secondary | ICD-10-CM | POA: Diagnosis not present

## 2019-03-17 DIAGNOSIS — Z8679 Personal history of other diseases of the circulatory system: Secondary | ICD-10-CM | POA: Diagnosis not present

## 2019-03-17 DIAGNOSIS — M8589 Other specified disorders of bone density and structure, multiple sites: Secondary | ICD-10-CM | POA: Diagnosis not present

## 2019-03-17 DIAGNOSIS — Z0000 Encounter for general adult medical examination without abnormal findings: Secondary | ICD-10-CM | POA: Diagnosis not present

## 2019-03-17 DIAGNOSIS — J302 Other seasonal allergic rhinitis: Secondary | ICD-10-CM | POA: Diagnosis not present

## 2019-03-28 DIAGNOSIS — R609 Edema, unspecified: Secondary | ICD-10-CM | POA: Diagnosis not present

## 2019-03-28 DIAGNOSIS — Z8679 Personal history of other diseases of the circulatory system: Secondary | ICD-10-CM | POA: Diagnosis not present

## 2019-03-28 DIAGNOSIS — I471 Supraventricular tachycardia: Secondary | ICD-10-CM | POA: Diagnosis not present

## 2019-04-13 DIAGNOSIS — I471 Supraventricular tachycardia: Secondary | ICD-10-CM | POA: Diagnosis not present

## 2019-04-13 DIAGNOSIS — Z8679 Personal history of other diseases of the circulatory system: Secondary | ICD-10-CM | POA: Diagnosis not present

## 2019-04-14 DIAGNOSIS — Z8679 Personal history of other diseases of the circulatory system: Secondary | ICD-10-CM | POA: Diagnosis not present

## 2019-04-14 DIAGNOSIS — R002 Palpitations: Secondary | ICD-10-CM | POA: Diagnosis not present

## 2019-05-11 DIAGNOSIS — Z961 Presence of intraocular lens: Secondary | ICD-10-CM | POA: Diagnosis not present

## 2019-06-28 DIAGNOSIS — M25561 Pain in right knee: Secondary | ICD-10-CM | POA: Diagnosis not present

## 2019-06-28 DIAGNOSIS — M25861 Other specified joint disorders, right knee: Secondary | ICD-10-CM | POA: Diagnosis not present

## 2019-07-20 DIAGNOSIS — Z8679 Personal history of other diseases of the circulatory system: Secondary | ICD-10-CM | POA: Diagnosis not present

## 2019-07-26 DIAGNOSIS — M25561 Pain in right knee: Secondary | ICD-10-CM | POA: Diagnosis not present

## 2019-07-28 ENCOUNTER — Other Ambulatory Visit: Payer: Self-pay | Admitting: Orthopedic Surgery

## 2019-07-28 DIAGNOSIS — M25561 Pain in right knee: Secondary | ICD-10-CM

## 2019-08-08 ENCOUNTER — Other Ambulatory Visit: Payer: Self-pay

## 2019-08-08 ENCOUNTER — Ambulatory Visit
Admission: RE | Admit: 2019-08-08 | Discharge: 2019-08-08 | Disposition: A | Discharge status: Home / Self Care | Payer: Medicare HMO | Source: Ambulatory Visit | Attending: Orthopedic Surgery | Admitting: Orthopedic Surgery

## 2019-08-08 DIAGNOSIS — S83411A Sprain of medial collateral ligament of right knee, initial encounter: Secondary | ICD-10-CM | POA: Diagnosis not present

## 2019-08-08 DIAGNOSIS — M25561 Pain in right knee: Secondary | ICD-10-CM

## 2019-08-10 DIAGNOSIS — S83241A Other tear of medial meniscus, current injury, right knee, initial encounter: Secondary | ICD-10-CM | POA: Diagnosis not present

## 2019-08-10 DIAGNOSIS — M5431 Sciatica, right side: Secondary | ICD-10-CM | POA: Diagnosis not present

## 2019-08-11 ENCOUNTER — Other Ambulatory Visit: Payer: Self-pay

## 2019-08-11 ENCOUNTER — Encounter: Payer: Self-pay | Admitting: *Deleted

## 2019-08-12 NOTE — Anesthesia Preprocedure Evaluation (Addendum)
Anesthesia Evaluation  Patient identified by MRN, date of birth, ID band Patient awake    Reviewed: Allergy & Precautions, NPO status , Patient's Chart, lab work & pertinent test results  History of Anesthesia Complications Negative for: history of anesthetic complications  Airway Mallampati: II  TM Distance: >3 FB Neck ROM: Full    Dental   Pulmonary asthma , former smoker,    breath sounds clear to auscultation       Cardiovascular (-) angina(-) DOE + dysrhythmias (Paroxysmal atrial tachycardia)  Rhythm:Regular Rate:Normal     Neuro/Psych    GI/Hepatic GERD  ,  Endo/Other    Renal/GU      Musculoskeletal   Abdominal   Peds  Hematology   Anesthesia Other Findings   Reproductive/Obstetrics                            Anesthesia Physical Anesthesia Plan  ASA: II  Anesthesia Plan: General   Post-op Pain Management:    Induction: Inhalational  PONV Risk Score and Plan: 3 and Ondansetron, Dexamethasone and Midazolam  Airway Management Planned: LMA  Additional Equipment:   Intra-op Plan:   Post-operative Plan:   Informed Consent: I have reviewed the patients History and Physical, chart, labs and discussed the procedure including the risks, benefits and alternatives for the proposed anesthesia with the patient or authorized representative who has indicated his/her understanding and acceptance.       Plan Discussed with: CRNA and Anesthesiologist  Anesthesia Plan Comments:        Anesthesia Quick Evaluation    Active Ambulatory Problems    Diagnosis Date Noted  . Left arm pain 01/20/2014   Resolved Ambulatory Problems    Diagnosis Date Noted  . No Resolved Ambulatory Problems   Past Medical History:  Diagnosis Date  . Asthma   . Dysrhythmia   . GERD (gastroesophageal reflux disease)   . Lumbago   . Paroxysmal atrial tachycardia ([place])   . Tubular adenoma of  colon     CBC    Component Value Date/Time   WBC 7.6 09/20/2018 1443   RBC 4.58 09/20/2018 1443   HGB 14.8 09/20/2018 1443   HCT 42.5 09/20/2018 1443   PLT 112 (L) 09/20/2018 1443   MCV 92.8 09/20/2018 1443   MCH 32.3 09/20/2018 1443   MCHC 34.8 09/20/2018 1443   RDW 11.9 09/20/2018 1443   LYMPHSABS 2.5 08/20/2015 1735   MONOABS 0.5 08/20/2015 1735   EOSABS 0.3 08/20/2015 1735   BASOSABS 0.1 08/20/2015 1735    CMP     Component Value Date/Time   NA 141 09/20/2018 1443   K 3.8 09/20/2018 1443   CL 105 09/20/2018 1443   CO2 30 09/20/2018 1443   GLUCOSE 83 09/20/2018 1443   BUN 10 09/20/2018 1443   CREATININE 0.66 09/20/2018 1443   CALCIUM 9.7 09/20/2018 1443   PROT 7.1 08/20/2015 1735   ALBUMIN 4.2 08/20/2015 1735   AST 46 (H) 08/20/2015 1735   ALT 35 08/20/2015 1735   ALKPHOS 122 08/20/2015 1735   BILITOT 0.3 08/20/2015 1735   GFRNONAA >60 09/20/2018 1443   GFRAA >60 09/20/2018 1443    COAGS No results found for: INR, PTT  I have seen and consented the patient, Traci Lynn. I have answered all of her questions regarding anesthesia. she is appropriately NPO.   Josephina Shih, MD Anesthesia

## 2019-08-16 ENCOUNTER — Other Ambulatory Visit
Admission: RE | Admit: 2019-08-16 | Discharge: 2019-08-16 | Disposition: A | Discharge status: Home / Self Care | Payer: Medicare HMO | Source: Ambulatory Visit | Attending: Orthopedic Surgery | Admitting: Orthopedic Surgery

## 2019-08-16 ENCOUNTER — Other Ambulatory Visit: Payer: Self-pay

## 2019-08-16 DIAGNOSIS — Z20828 Contact with and (suspected) exposure to other viral communicable diseases: Secondary | ICD-10-CM | POA: Diagnosis not present

## 2019-08-16 DIAGNOSIS — S83241A Other tear of medial meniscus, current injury, right knee, initial encounter: Secondary | ICD-10-CM | POA: Diagnosis not present

## 2019-08-16 DIAGNOSIS — Z01812 Encounter for preprocedural laboratory examination: Secondary | ICD-10-CM | POA: Diagnosis not present

## 2019-08-16 LAB — SARS CORONAVIRUS 2 (TAT 6-24 HRS): SARS Coronavirus 2: NEGATIVE

## 2019-08-19 ENCOUNTER — Other Ambulatory Visit: Payer: Medicare HMO

## 2019-08-19 ENCOUNTER — Other Ambulatory Visit: Payer: Self-pay

## 2019-08-19 ENCOUNTER — Ambulatory Visit: Payer: Medicare HMO | Admitting: Anesthesiology

## 2019-08-19 ENCOUNTER — Ambulatory Visit
Admission: RE | Admit: 2019-08-19 | Discharge: 2019-08-19 | Disposition: A | Discharge status: Home / Self Care | Payer: Medicare HMO | Attending: Orthopedic Surgery | Admitting: Orthopedic Surgery

## 2019-08-19 ENCOUNTER — Encounter
Admission: RE | Disposition: A | Discharge status: Home / Self Care | Payer: Self-pay | Source: Home / Self Care | Attending: Orthopedic Surgery

## 2019-08-19 DIAGNOSIS — J45909 Unspecified asthma, uncomplicated: Secondary | ICD-10-CM | POA: Diagnosis not present

## 2019-08-19 DIAGNOSIS — Z833 Family history of diabetes mellitus: Secondary | ICD-10-CM | POA: Insufficient documentation

## 2019-08-19 DIAGNOSIS — K219 Gastro-esophageal reflux disease without esophagitis: Secondary | ICD-10-CM | POA: Diagnosis not present

## 2019-08-19 DIAGNOSIS — Z87891 Personal history of nicotine dependence: Secondary | ICD-10-CM | POA: Diagnosis not present

## 2019-08-19 DIAGNOSIS — M6751 Plica syndrome, right knee: Secondary | ICD-10-CM | POA: Diagnosis not present

## 2019-08-19 DIAGNOSIS — Z800 Family history of malignant neoplasm of digestive organs: Secondary | ICD-10-CM | POA: Diagnosis not present

## 2019-08-19 DIAGNOSIS — Z79899 Other long term (current) drug therapy: Secondary | ICD-10-CM | POA: Insufficient documentation

## 2019-08-19 DIAGNOSIS — Z9842 Cataract extraction status, left eye: Secondary | ICD-10-CM | POA: Insufficient documentation

## 2019-08-19 DIAGNOSIS — M65861 Other synovitis and tenosynovitis, right lower leg: Secondary | ICD-10-CM | POA: Diagnosis not present

## 2019-08-19 DIAGNOSIS — Z7951 Long term (current) use of inhaled steroids: Secondary | ICD-10-CM | POA: Insufficient documentation

## 2019-08-19 DIAGNOSIS — X501XXA Overexertion from prolonged static or awkward postures, initial encounter: Secondary | ICD-10-CM | POA: Diagnosis not present

## 2019-08-19 DIAGNOSIS — I471 Supraventricular tachycardia: Secondary | ICD-10-CM | POA: Diagnosis not present

## 2019-08-19 DIAGNOSIS — Z90710 Acquired absence of both cervix and uterus: Secondary | ICD-10-CM | POA: Diagnosis not present

## 2019-08-19 DIAGNOSIS — S83241A Other tear of medial meniscus, current injury, right knee, initial encounter: Secondary | ICD-10-CM | POA: Diagnosis not present

## 2019-08-19 DIAGNOSIS — Y9389 Activity, other specified: Secondary | ICD-10-CM | POA: Diagnosis not present

## 2019-08-19 DIAGNOSIS — S83231A Complex tear of medial meniscus, current injury, right knee, initial encounter: Secondary | ICD-10-CM | POA: Diagnosis not present

## 2019-08-19 DIAGNOSIS — Z8249 Family history of ischemic heart disease and other diseases of the circulatory system: Secondary | ICD-10-CM | POA: Diagnosis not present

## 2019-08-19 DIAGNOSIS — Z825 Family history of asthma and other chronic lower respiratory diseases: Secondary | ICD-10-CM | POA: Insufficient documentation

## 2019-08-19 DIAGNOSIS — Z9841 Cataract extraction status, right eye: Secondary | ICD-10-CM | POA: Insufficient documentation

## 2019-08-19 DIAGNOSIS — Z8051 Family history of malignant neoplasm of kidney: Secondary | ICD-10-CM | POA: Diagnosis not present

## 2019-08-19 HISTORY — PX: KNEE ARTHROSCOPY WITH MEDIAL MENISECTOMY: SHX5651

## 2019-08-19 SURGERY — ARTHROSCOPY, KNEE, WITH MEDIAL MENISCECTOMY
Anesthesia: General | Site: Knee | Laterality: Right

## 2019-08-19 MED ORDER — FENTANYL CITRATE (PF) 100 MCG/2ML IJ SOLN
INTRAMUSCULAR | Status: DC | PRN
  Administered 2019-08-19: 50 ug via INTRAVENOUS

## 2019-08-19 MED ORDER — ONDANSETRON HCL 4 MG/2ML IJ SOLN
INTRAMUSCULAR | Status: DC | PRN
  Administered 2019-08-19: 4 mg via INTRAVENOUS

## 2019-08-19 MED ORDER — OXYCODONE HCL 5 MG PO TABS
5.0000 mg | ORAL_TABLET | Freq: Once | ORAL | Status: AC | PRN
  Administered 2019-08-19: 5 mg via ORAL

## 2019-08-19 MED ORDER — DEXAMETHASONE SODIUM PHOSPHATE 4 MG/ML IJ SOLN
INTRAMUSCULAR | Status: DC | PRN
  Administered 2019-08-19: 4 mg via INTRAVENOUS

## 2019-08-19 MED ORDER — ONDANSETRON 4 MG PO TBDP: 4.0000 mg | ORAL_TABLET | Freq: Three times a day (TID) | ORAL | 0 refills | Status: DC | PRN

## 2019-08-19 MED ORDER — IBUPROFEN 800 MG PO TABS: 800.0000 mg | ORAL_TABLET | Freq: Three times a day (TID) | ORAL | 1 refills | Status: AC

## 2019-08-19 MED ORDER — OXYCODONE HCL 5 MG/5ML PO SOLN: 5.0000 mg | Freq: Once | ORAL | Status: AC | PRN

## 2019-08-19 MED ORDER — LIDOCAINE-EPINEPHRINE 1 %-1:100000 IJ SOLN
INTRAMUSCULAR | Status: DC | PRN
  Administered 2019-08-19: 2.5 mL
  Administered 2019-08-19: 5 mL

## 2019-08-19 MED ORDER — MIDAZOLAM HCL 5 MG/5ML IJ SOLN
INTRAMUSCULAR | Status: DC | PRN
  Administered 2019-08-19: 2 mg via INTRAVENOUS

## 2019-08-19 MED ORDER — PROPOFOL 10 MG/ML IV BOLUS
INTRAVENOUS | Status: DC | PRN
  Administered 2019-08-19: 150 mg via INTRAVENOUS

## 2019-08-19 MED ORDER — GLYCOPYRROLATE 0.2 MG/ML IJ SOLN
INTRAMUSCULAR | Status: DC | PRN
  Administered 2019-08-19: 0.1 mg via INTRAVENOUS

## 2019-08-19 MED ORDER — HYDROCODONE-ACETAMINOPHEN 5-325 MG PO TABS: 1.0000 | ORAL_TABLET | ORAL | 0 refills | Status: DC | PRN

## 2019-08-19 MED ORDER — EPHEDRINE SULFATE 50 MG/ML IJ SOLN
INTRAMUSCULAR | Status: DC | PRN
  Administered 2019-08-19 (×2): 5 mg via INTRAVENOUS
  Administered 2019-08-19: 10 mg via INTRAVENOUS

## 2019-08-19 MED ORDER — CLINDAMYCIN PHOSPHATE 900 MG/50ML IV SOLN
900.0000 mg | Freq: Once | INTRAVENOUS | Status: AC
  Administered 2019-08-19: 13:00:00 900 mg via INTRAVENOUS

## 2019-08-19 MED ORDER — KETOROLAC TROMETHAMINE 30 MG/ML IJ SOLN
INTRAMUSCULAR | Status: DC | PRN
  Administered 2019-08-19: 30 mg via INTRAVENOUS

## 2019-08-19 MED ORDER — LACTATED RINGERS IV SOLN
100.0000 mL/h | INTRAVENOUS | Status: DC
  Administered 2019-08-19: 100 mL/h via INTRAVENOUS

## 2019-08-19 MED ORDER — HYDROMORPHONE HCL 1 MG/ML IJ SOLN: 0.2500 mg | INTRAMUSCULAR | Status: DC | PRN

## 2019-08-19 MED ORDER — BUPIVACAINE HCL (PF) 0.5 % IJ SOLN
INTRAMUSCULAR | Status: DC | PRN
  Administered 2019-08-19: 5 mL
  Administered 2019-08-19: 2.5 mL

## 2019-08-19 MED ORDER — LIDOCAINE HCL (CARDIAC) PF 100 MG/5ML IV SOSY
PREFILLED_SYRINGE | INTRAVENOUS | Status: DC | PRN
  Administered 2019-08-19: 50 mg via INTRATRACHEAL

## 2019-08-19 MED ORDER — MEPERIDINE HCL 25 MG/ML IJ SOLN: 6.2500 mg | INTRAMUSCULAR | Status: DC | PRN

## 2019-08-19 MED ORDER — ASPIRIN EC 325 MG PO TBEC: 325.0000 mg | DELAYED_RELEASE_TABLET | Freq: Every day | ORAL | 0 refills | Status: AC

## 2019-08-19 MED ORDER — PROMETHAZINE HCL 25 MG/ML IJ SOLN: 6.2500 mg | INTRAMUSCULAR | Status: DC | PRN

## 2019-08-19 MED ORDER — ACETAMINOPHEN 500 MG PO TABS: 1000.0000 mg | ORAL_TABLET | Freq: Three times a day (TID) | ORAL | 2 refills | Status: AC

## 2019-08-19 SURGICAL SUPPLY — 38 items
ADAPTER IRRIG TUBE 2 SPIKE SOL (ADAPTER) ×4 IMPLANT
BLADE SURG SZ11 CARB STEEL (BLADE) ×2 IMPLANT
BNDG COHESIVE 4X5 TAN STRL (GAUZE/BANDAGES/DRESSINGS) ×2 IMPLANT
BNDG ESMARK 6X12 TAN STRL LF (GAUZE/BANDAGES/DRESSINGS) ×2 IMPLANT
BUR RADIUS 3.5 (BURR) ×2 IMPLANT
CHLORAPREP W/TINT 26 (MISCELLANEOUS) ×2 IMPLANT
COOLER POLAR GLACIER W/PUMP (MISCELLANEOUS) ×2 IMPLANT
COVER LIGHT HANDLE UNIVERSAL (MISCELLANEOUS) ×4 IMPLANT
CUFF TOURN SGL QUICK 24 (TOURNIQUET CUFF) ×1
CUFF TOURN SGL QUICK 34 (TOURNIQUET CUFF) ×1
CUFF TRNQT CYL 24X4X16.5-23 (TOURNIQUET CUFF) ×1 IMPLANT
CUFF TRNQT CYL 34X4.125X (TOURNIQUET CUFF) ×1 IMPLANT
DRAPE IMP U-DRAPE 54X76 (DRAPES) ×2 IMPLANT
GAUZE SPONGE 4X4 12PLY STRL (GAUZE/BANDAGES/DRESSINGS) ×2 IMPLANT
GLOVE BIO SURGEON STRL SZ7.5 (GLOVE) ×2 IMPLANT
GLOVE BIOGEL PI IND STRL 8 (GLOVE) ×1 IMPLANT
GLOVE BIOGEL PI INDICATOR 8 (GLOVE) ×1
GOWN STRL REIN 2XL XLG LVL4 (GOWN DISPOSABLE) ×2 IMPLANT
GOWN STRL REUS W/ TWL LRG LVL3 (GOWN DISPOSABLE) ×1 IMPLANT
GOWN STRL REUS W/TWL LRG LVL3 (GOWN DISPOSABLE) ×1
IV LACTATED RINGER IRRG 3000ML (IV SOLUTION) ×4
IV LR IRRIG 3000ML ARTHROMATIC (IV SOLUTION) ×4 IMPLANT
KIT TURNOVER KIT A (KITS) ×2 IMPLANT
MAT ABSORB  FLUID 56X50 GRAY (MISCELLANEOUS) ×1
MAT ABSORB FLUID 56X50 GRAY (MISCELLANEOUS) ×1 IMPLANT
NEPTUNE MANIFOLD (MISCELLANEOUS) ×2 IMPLANT
PACK ARTHROSCOPY KNEE (MISCELLANEOUS) ×2 IMPLANT
PAD WRAPON POLAR KNEE (MISCELLANEOUS) ×1 IMPLANT
PADDING CAST BLEND 6X4 STRL (MISCELLANEOUS) ×1 IMPLANT
PADDING STRL CAST 6IN (MISCELLANEOUS) ×1
SET TUBE SUCT SHAVER OUTFL 24K (TUBING) ×2 IMPLANT
SET TUBE TIP INTRA-ARTICULAR (MISCELLANEOUS) ×2 IMPLANT
SUT ETHILON 3-0 FS-10 30 BLK (SUTURE) ×2
SUTURE EHLN 3-0 FS-10 30 BLK (SUTURE) ×1 IMPLANT
TOWEL OR 17X26 4PK STRL BLUE (TOWEL DISPOSABLE) ×4 IMPLANT
TUBING ARTHRO INFLOW-ONLY STRL (TUBING) ×2 IMPLANT
WAND WEREWOLF FLOW 90D (MISCELLANEOUS) ×2 IMPLANT
WRAPON POLAR PAD KNEE (MISCELLANEOUS) ×2

## 2019-08-19 NOTE — Anesthesia Postprocedure Evaluation (Signed)
Anesthesia Post Note  Patient: Traci Lynn  Procedure(s) Performed: KNEE ARTHROSCOPY WITH PARTIAL MEDIAL MENISECTOMY,  PLICA EXCISION (Right Knee)  Patient location during evaluation: PACU Anesthesia Type: General Level of consciousness: awake and alert Pain management: pain level controlled Vital Signs Assessment: post-procedure vital signs reviewed and stable Respiratory status: spontaneous breathing, nonlabored ventilation, respiratory function stable and patient connected to nasal cannula oxygen Cardiovascular status: blood pressure returned to baseline and stable Postop Assessment: no apparent nausea or vomiting Anesthetic complications: no    Taia Bramlett A  Brantley Naser

## 2019-08-19 NOTE — Discharge Instructions (Signed)
Arthroscopic Knee Surgery - Partial Meniscectomy °  °Post-Op Instructions °  °1. Bracing or crutches: Crutches will be provided at the time of discharge from the surgery center if you do not already have them. °  °2. Ice: You may be provided with a device (Polar Care) that allows you to ice the affected area effectively. Otherwise you can ice manually.  °  °3. Driving:  Plan on not driving for at least two weeks. Please note that you are advised NOT to drive while taking narcotic pain medications as you may be impaired and unsafe to drive. °  °4. Activity: Ankle pumps several times an hour while awake to prevent blood clots. Weight bearing: as tolerated. Use crutches for as needed (usually ~1 week or less) until pain allows you to ambulate without a limp. Bending and straightening the knee is unlimited. Elevate knee above heart level as much as possible for one week. Avoid standing more than 5 minutes (consecutively) for the first week.  Avoid long distance travel for 2 weeks. ° °5. Medications:  °- You have been provided a prescription for narcotic pain medicine. After surgery, take 1-2 narcotic tablets every 4 hours if needed for severe pain.  °- You may take up to 3000mg/day of tylenol (acetaminophen). You can take 1000mg 3x/day. Please check your narcotic. If you have acetaminophen in your narcotic (each tablet will be 325mg), be careful not to exceed a total of 3000mg/day of acetaminophen.  °- A prescription for anti-nausea medication will be provided in case the narcotic medicine or anesthesia causes nausea - take 1 tablet every 6 hours only if nauseated.  °- Take ibuprofen 800 mg every 8 hours WITH food to reduce post-operative knee swelling. DO NOT STOP IBUPROFEN POST-OP UNTIL INSTRUCTED TO DO SO at first post-op office visit (10-14 days after surgery). However, please discontinue if you have any abdominal discomfort after taking this.  °- Take enteric coated aspirin 325 mg once daily for 2 weeks to prevent  blood clots.  ° ° °6. Bandages: The physical therapist should change the bandages at the first post-op appointment. If needed, the dressing supplies have been provided to you. °  °7. Physical Therapy: 1-2 times per week for 6 weeks. Therapy typically starts on post operative Day 3 or 4. You have been provided an order for physical therapy. The therapist will provide home exercises. °  °8. Work: May return to full work usually around 2 weeks after 1st post-operative visit. May do light duty/desk job in approximately 1-2 weeks when off of narcotics, pain is well-controlled, and swelling has decreased. Labor intensive jobs may require 4-6 weeks to return.  °  °  °9. Post-Op Appointments: °Your first post-op appointment will be with Dr. Odell Choung in approximately 2 weeks time.  °  °If you find that they have not been scheduled please call the Orthopaedic Appoint[facility] front desk at [phone]. ° ° ° ° °General Anesthesia, Adult, Care After °This sheet gives you information about how to care for yourself after your procedure. Your health care provider may also give you more specific instructions. If you have problems or questions, contact your health care provider. °What can I expect after the procedure? °After the procedure, the following side effects are common: °· Pain or discomfort at the IV site. °· Nausea. °· Vomiting. °· Sore throat. °· Trouble concentrating. °· Feeling cold or chills. °· Weak or tired. °· Sleepiness and fatigue. °· Soreness and body aches. These side effects can affect parts   the body that were not involved in surgery. Follow these instructions at home:  For at least 24 hours after the procedure:  Have a responsible adult stay with you. It is important to have someone help care for you until you are awake and alert.  Rest as needed.  Do not: ? Participate in activities in which you could fall or become injured. ? Drive. ? Use heavy machinery. ? Drink alcohol. ? Take sleeping pills or  medicines that cause drowsiness. ? Make important decisions or sign legal documents. ? Take care of children on your own. Eating and drinking  Follow any instructions from your health care provider about eating or drinking restrictions.  When you feel hungry, start by eating small amounts of foods that are soft and easy to digest (bland), such as toast. Gradually return to your regular diet.  Drink enough fluid to keep your urine pale yellow.  If you vomit, rehydrate by drinking water, juice, or clear broth. General instructions  If you have sleep apnea, surgery and certain medicines can increase your risk for breathing problems. Follow instructions from your health care provider about wearing your sleep device: ? Anytime you are sleeping, including during daytime naps. ? While taking prescription pain medicines, sleeping medicines, or medicines that make you drowsy.  Return to your normal activities as told by your health care provider. Ask your health care provider what activities are safe for you.  Take over-the-counter and prescription medicines only as told by your health care provider.  If you smoke, do not smoke without supervision.  Keep all follow-up visits as told by your health care provider. This is important. Contact a health care provider if:  You have nausea or vomiting that does not get better with medicine.  You cannot eat or drink without vomiting.  You have pain that does not get better with medicine.  You are unable to pass urine.  You develop a skin rash.  You have a fever.  You have redness around your IV site that gets worse. Get help right away if:  You have difficulty breathing.  You have chest pain.  You have blood in your urine or stool, or you vomit blood. Summary  After the procedure, it is common to have a sore throat or nausea. It is also common to feel tired.  Have a responsible adult stay with you for the first 24 hours after general  anesthesia. It is important to have someone help care for you until you are awake and alert.  When you feel hungry, start by eating small amounts of foods that are soft and easy to digest (bland), such as toast. Gradually return to your regular diet.  Drink enough fluid to keep your urine pale yellow.  Return to your normal activities as told by your health care provider. Ask your health care provider what activities are safe for you. This information is not intended to replace advice given to you by your health care provider. Make sure you discuss any questions you have with your health care provider. Document Released: 03/02/2001 Document Revised: 11/27/2017 Document Reviewed: 07/10/2017 Elsevier Patient Education  2020 [employer].

## 2019-08-19 NOTE — H&P (Signed)
Paper H&P to be scanned into permanent record. H&P reviewed. No significant changes noted.  

## 2019-08-19 NOTE — Transfer of Care (Signed)
Immediate Anesthesia Transfer of Care Note  Patient: Traci Lynn  Procedure(s) Performed: KNEE ARTHROSCOPY WITH PARTIAL MEDIAL MENISECTOMY,  PLICA EXCISION (Right Knee)  Patient Location: PACU  Anesthesia Type: General  Level of Consciousness: awake, alert  and patient cooperative  Airway and Oxygen Therapy: Patient Spontanous Breathing and Patient connected to supplemental oxygen  Post-op Assessment: Post-op Vital signs reviewed, Patient's Cardiovascular Status Stable, Respiratory Function Stable, Patent Airway and No signs of Nausea or vomiting  Post-op Vital Signs: Reviewed and stable  Complications: No apparent anesthesia complications

## 2019-08-19 NOTE — Op Note (Signed)
Operative Note    SURGERY DATE: 08/19/2019   PRE-OP DIAGNOSIS:  1. Right medial meniscus tear   POST-OP DIAGNOSIS:  1. Right medial meniscus tear 2. Right medial plica band   PROCEDURES:  1. Right knee arthroscopy, partial medial meniscectomy 2.  Right knee arthroscopic partial synovectomy with medial plica excision   SURGEON: Cato Mulligan, MD   ANESTHESIA: Gen   ESTIMATED BLOOD LOSS: minimal   TOTAL IV FLUIDS: per anesthesia   INDICATION(S):  Traci Lynn is a 68 y.o. female with signs and symptoms as well as MRI finding of medial meniscus tear.  She failed conservative management with medications, activity modifications, and corticosteroid injection.  After discussion of risks, benefits, and alternatives to surgery, the patient elected to proceed.   OPERATIVE FINDINGS:    Examination under anesthesia: A careful examination under anesthesia was performed.  Passive range of motion was: Hyperextension: 2  Extension: 0.  Flexion: 130.  Lachman: normal. Pivot Shift: normal.  Posterior drawer: normal.  Varus stability in full extension: normal.  Varus stability in 30 degrees of flexion: normal.  Valgus stability in full extension: normal.  Valgus stability in 30 degrees of flexion: normal.   Intra-operative findings: A thorough arthroscopic examination of the knee was performed.  The findings are: 1. Suprapatellar pouch: Normal 2. Undersurface of median ridge: Grade 1 softening 3. Medial patellar facet: Grade 1 softening 4. Lateral patellar facet: Grade 1 softening 5. Trochlea: Normal 6. Lateral gutter/popliteus tendon: Normal 7. Hoffa's fat pad: Inflamed 8. Medial gutter/plica: Significant medial plica with impingement on the medial femoral condyle 9. ACL: Normal 10. PCL: Normal 11. Medial meniscus: Complex tear with both radial and horizontal components at the posterior horn/body junction involving approximately 75% of the meniscus width 12. Medial compartment cartilage:  Small focal areas of grade 2 degenerative changes to the medial femoral condyle and tibial plateau 13. Lateral meniscus: Normal 14. Lateral compartment cartilage: Grade 1 degenerative changes to the tibial plateau with normal lateral femoral condyle   OPERATIVE REPORT:     I identified Traci Lynn in the pre-operative holding area. I marked the operative knee with my initials. I reviewed the risks and benefits of the proposed surgical intervention and the patient (and/or patient's guardian) wished to proceed. The patient was transferred to the operative suite and placed in the supine position with all bony prominences padded.  Anesthesia was administered. Appropriate IV antibiotics were administered prior to incision. The extremity was then prepped and draped in standard fashion. A time out was performed confirming the correct extremity, correct patient, and correct procedure.   Arthroscopy portals were marked. Local anesthetic was injected to the planned portal sites. The anterolateral portal was established with an 11 blade.      The arthroscope was placed in the anterolateral portal and then into the suprapatellar pouch. Next, the medial portal was established under needle localization. The MCL was pie-crusted to improve visualization of the posterior horn.  A diagnostic knee scope was completed with the above findings.   The medial meniscus tear was identified. The meniscal tear was debrided using an arthroscopic biter and an oscillating shaver until the meniscus had stable borders.  There was approximately 3 mm of meniscus rim left at the deepest point of resection at the posterior horn/body junction.  Next, partial synovectomy with medial plica excision was performed using a combination of the ArthroCare wand and oscillating shaver.  Arthroscopic fluid was removed from the joint.   The portals were  closed with 3-0 Nylon suture. Sterile dressings included Xeroform, 4x4s, Sof-Rol, and Bias  wrap. A Polarcare was placed.  The patient was then awakened and taken to the PACU hemodynamically stable without complication.     POSTOPERATIVE PLAN: The patient will be discharged home today once they meet PACU criteria. Aspirin 325 mg daily was prescribed for 2 weeks for DVT prophylaxis.  Physical therapy will start on POD#3-4. Weight-bearing as tolerated. Follow up in 2 weeks per protocol.

## 2019-08-19 NOTE — Anesthesia Procedure Notes (Signed)
Procedure Name: LMA Insertion Date/Time: 08/19/2019 12:38 PM Performed by: Mayme Genta, CRNA Pre-anesthesia Checklist: Patient identified, Emergency Drugs available, Suction available, Timeout performed and Patient being monitored Patient Re-evaluated:Patient Re-evaluated prior to induction Oxygen Delivery Method: Circle system utilized Preoxygenation: Pre-oxygenation with 100% oxygen Induction Type: IV induction LMA: LMA inserted LMA Size: 4.0 Number of attempts: 1 Placement Confirmation: positive ETCO2 and breath sounds checked- equal and bilateral Tube secured with: Tape

## 2019-08-22 ENCOUNTER — Encounter: Payer: Self-pay | Admitting: Orthopedic Surgery

## 2019-08-22 DIAGNOSIS — M25661 Stiffness of right knee, not elsewhere classified: Secondary | ICD-10-CM | POA: Diagnosis not present

## 2019-08-22 DIAGNOSIS — M6281 Muscle weakness (generalized): Secondary | ICD-10-CM | POA: Diagnosis not present

## 2019-08-22 DIAGNOSIS — M25561 Pain in right knee: Secondary | ICD-10-CM | POA: Diagnosis not present

## 2019-08-22 DIAGNOSIS — S83241A Other tear of medial meniscus, current injury, right knee, initial encounter: Secondary | ICD-10-CM | POA: Diagnosis not present

## 2019-08-26 DIAGNOSIS — S83241A Other tear of medial meniscus, current injury, right knee, initial encounter: Secondary | ICD-10-CM | POA: Diagnosis not present

## 2019-08-26 DIAGNOSIS — M6281 Muscle weakness (generalized): Secondary | ICD-10-CM | POA: Diagnosis not present

## 2019-08-26 DIAGNOSIS — M25561 Pain in right knee: Secondary | ICD-10-CM | POA: Diagnosis not present

## 2019-08-26 DIAGNOSIS — M25661 Stiffness of right knee, not elsewhere classified: Secondary | ICD-10-CM | POA: Diagnosis not present

## 2019-08-29 DIAGNOSIS — S83241A Other tear of medial meniscus, current injury, right knee, initial encounter: Secondary | ICD-10-CM | POA: Diagnosis not present

## 2019-08-29 DIAGNOSIS — M25661 Stiffness of right knee, not elsewhere classified: Secondary | ICD-10-CM | POA: Diagnosis not present

## 2019-08-29 DIAGNOSIS — M6281 Muscle weakness (generalized): Secondary | ICD-10-CM | POA: Diagnosis not present

## 2019-08-29 DIAGNOSIS — M25561 Pain in right knee: Secondary | ICD-10-CM | POA: Diagnosis not present

## 2019-09-02 DIAGNOSIS — M6281 Muscle weakness (generalized): Secondary | ICD-10-CM | POA: Diagnosis not present

## 2019-09-02 DIAGNOSIS — S83241A Other tear of medial meniscus, current injury, right knee, initial encounter: Secondary | ICD-10-CM | POA: Diagnosis not present

## 2019-09-02 DIAGNOSIS — M25561 Pain in right knee: Secondary | ICD-10-CM | POA: Diagnosis not present

## 2019-09-02 DIAGNOSIS — M25661 Stiffness of right knee, not elsewhere classified: Secondary | ICD-10-CM | POA: Diagnosis not present

## 2019-09-09 DIAGNOSIS — S83241A Other tear of medial meniscus, current injury, right knee, initial encounter: Secondary | ICD-10-CM | POA: Diagnosis not present

## 2019-09-09 DIAGNOSIS — M25561 Pain in right knee: Secondary | ICD-10-CM | POA: Diagnosis not present

## 2019-09-09 DIAGNOSIS — M6281 Muscle weakness (generalized): Secondary | ICD-10-CM | POA: Diagnosis not present

## 2019-09-09 DIAGNOSIS — Z0000 Encounter for general adult medical examination without abnormal findings: Secondary | ICD-10-CM | POA: Diagnosis not present

## 2019-09-09 DIAGNOSIS — M25661 Stiffness of right knee, not elsewhere classified: Secondary | ICD-10-CM | POA: Diagnosis not present

## 2019-09-09 DIAGNOSIS — Z136 Encounter for screening for cardiovascular disorders: Secondary | ICD-10-CM | POA: Diagnosis not present

## 2019-09-12 DIAGNOSIS — M25661 Stiffness of right knee, not elsewhere classified: Secondary | ICD-10-CM | POA: Diagnosis not present

## 2019-09-12 DIAGNOSIS — M6281 Muscle weakness (generalized): Secondary | ICD-10-CM | POA: Diagnosis not present

## 2019-09-12 DIAGNOSIS — S83241A Other tear of medial meniscus, current injury, right knee, initial encounter: Secondary | ICD-10-CM | POA: Diagnosis not present

## 2019-09-12 DIAGNOSIS — M25561 Pain in right knee: Secondary | ICD-10-CM | POA: Diagnosis not present

## 2019-09-16 DIAGNOSIS — M6281 Muscle weakness (generalized): Secondary | ICD-10-CM | POA: Diagnosis not present

## 2019-09-16 DIAGNOSIS — S83241A Other tear of medial meniscus, current injury, right knee, initial encounter: Secondary | ICD-10-CM | POA: Diagnosis not present

## 2019-09-16 DIAGNOSIS — M25661 Stiffness of right knee, not elsewhere classified: Secondary | ICD-10-CM | POA: Diagnosis not present

## 2019-09-16 DIAGNOSIS — M25561 Pain in right knee: Secondary | ICD-10-CM | POA: Diagnosis not present

## 2019-09-19 DIAGNOSIS — M25661 Stiffness of right knee, not elsewhere classified: Secondary | ICD-10-CM | POA: Diagnosis not present

## 2019-09-19 DIAGNOSIS — M25561 Pain in right knee: Secondary | ICD-10-CM | POA: Diagnosis not present

## 2019-09-19 DIAGNOSIS — M6281 Muscle weakness (generalized): Secondary | ICD-10-CM | POA: Diagnosis not present

## 2019-09-19 DIAGNOSIS — S83241A Other tear of medial meniscus, current injury, right knee, initial encounter: Secondary | ICD-10-CM | POA: Diagnosis not present

## 2019-09-22 DIAGNOSIS — Z0000 Encounter for general adult medical examination without abnormal findings: Secondary | ICD-10-CM | POA: Diagnosis not present

## 2019-09-22 DIAGNOSIS — Z87891 Personal history of nicotine dependence: Secondary | ICD-10-CM | POA: Diagnosis not present

## 2019-09-23 DIAGNOSIS — M6281 Muscle weakness (generalized): Secondary | ICD-10-CM | POA: Diagnosis not present

## 2019-09-23 DIAGNOSIS — M25661 Stiffness of right knee, not elsewhere classified: Secondary | ICD-10-CM | POA: Diagnosis not present

## 2019-09-23 DIAGNOSIS — S83241A Other tear of medial meniscus, current injury, right knee, initial encounter: Secondary | ICD-10-CM | POA: Diagnosis not present

## 2019-09-23 DIAGNOSIS — M25561 Pain in right knee: Secondary | ICD-10-CM | POA: Diagnosis not present

## 2019-09-26 ENCOUNTER — Other Ambulatory Visit: Payer: Self-pay | Admitting: Family Medicine

## 2019-09-26 DIAGNOSIS — M25561 Pain in right knee: Secondary | ICD-10-CM | POA: Diagnosis not present

## 2019-09-26 DIAGNOSIS — M25661 Stiffness of right knee, not elsewhere classified: Secondary | ICD-10-CM | POA: Diagnosis not present

## 2019-09-26 DIAGNOSIS — S83241A Other tear of medial meniscus, current injury, right knee, initial encounter: Secondary | ICD-10-CM | POA: Diagnosis not present

## 2019-09-26 DIAGNOSIS — M6281 Muscle weakness (generalized): Secondary | ICD-10-CM | POA: Diagnosis not present

## 2019-09-26 DIAGNOSIS — Z1231 Encounter for screening mammogram for malignant neoplasm of breast: Secondary | ICD-10-CM

## 2019-10-03 DIAGNOSIS — M25561 Pain in right knee: Secondary | ICD-10-CM | POA: Diagnosis not present

## 2019-10-03 DIAGNOSIS — S83241A Other tear of medial meniscus, current injury, right knee, initial encounter: Secondary | ICD-10-CM | POA: Diagnosis not present

## 2019-10-03 DIAGNOSIS — M6281 Muscle weakness (generalized): Secondary | ICD-10-CM | POA: Diagnosis not present

## 2019-10-03 DIAGNOSIS — M25661 Stiffness of right knee, not elsewhere classified: Secondary | ICD-10-CM | POA: Diagnosis not present

## 2019-10-07 DIAGNOSIS — M25561 Pain in right knee: Secondary | ICD-10-CM | POA: Diagnosis not present

## 2019-10-07 DIAGNOSIS — S83241A Other tear of medial meniscus, current injury, right knee, initial encounter: Secondary | ICD-10-CM | POA: Diagnosis not present

## 2019-10-07 DIAGNOSIS — M6281 Muscle weakness (generalized): Secondary | ICD-10-CM | POA: Diagnosis not present

## 2019-10-07 DIAGNOSIS — M25661 Stiffness of right knee, not elsewhere classified: Secondary | ICD-10-CM | POA: Diagnosis not present

## 2019-10-27 DIAGNOSIS — R58 Hemorrhage, not elsewhere classified: Secondary | ICD-10-CM | POA: Diagnosis not present

## 2019-10-27 DIAGNOSIS — Z87891 Personal history of nicotine dependence: Secondary | ICD-10-CM | POA: Diagnosis not present

## 2019-12-16 ENCOUNTER — Ambulatory Visit
Admission: RE | Admit: 2019-12-16 | Discharge: 2019-12-16 | Disposition: A | Discharge status: Home / Self Care | Payer: Medicare HMO | Source: Ambulatory Visit | Attending: Family Medicine | Admitting: Family Medicine

## 2019-12-16 DIAGNOSIS — Z1231 Encounter for screening mammogram for malignant neoplasm of breast: Secondary | ICD-10-CM | POA: Diagnosis not present

## 2020-01-20 DIAGNOSIS — Z8679 Personal history of other diseases of the circulatory system: Secondary | ICD-10-CM | POA: Diagnosis not present

## 2020-01-20 DIAGNOSIS — R002 Palpitations: Secondary | ICD-10-CM | POA: Diagnosis not present

## 2020-01-24 DIAGNOSIS — R040 Epistaxis: Secondary | ICD-10-CM | POA: Diagnosis not present

## 2020-01-24 DIAGNOSIS — H6060 Unspecified chronic otitis externa, unspecified ear: Secondary | ICD-10-CM | POA: Diagnosis not present

## 2020-01-24 DIAGNOSIS — J340 Abscess, furuncle and carbuncle of nose: Secondary | ICD-10-CM | POA: Diagnosis not present

## 2020-02-17 DIAGNOSIS — K5909 Other constipation: Secondary | ICD-10-CM | POA: Diagnosis not present

## 2020-02-17 DIAGNOSIS — K2270 Barrett's esophagus without dysplasia: Secondary | ICD-10-CM | POA: Diagnosis not present

## 2020-02-17 DIAGNOSIS — R1314 Dysphagia, pharyngoesophageal phase: Secondary | ICD-10-CM | POA: Diagnosis not present

## 2020-04-16 DIAGNOSIS — J019 Acute sinusitis, unspecified: Secondary | ICD-10-CM | POA: Diagnosis not present

## 2020-04-16 DIAGNOSIS — Z87891 Personal history of nicotine dependence: Secondary | ICD-10-CM | POA: Diagnosis not present

## 2020-05-21 ENCOUNTER — Other Ambulatory Visit
Admission: RE | Admit: 2020-05-21 | Discharge: 2020-05-21 | Disposition: A | Discharge status: Home / Self Care | Payer: Medicare HMO | Source: Ambulatory Visit | Attending: Internal Medicine | Admitting: Internal Medicine

## 2020-05-21 ENCOUNTER — Other Ambulatory Visit: Payer: Self-pay

## 2020-05-21 DIAGNOSIS — Z20822 Contact with and (suspected) exposure to covid-19: Secondary | ICD-10-CM | POA: Insufficient documentation

## 2020-05-21 DIAGNOSIS — Z01812 Encounter for preprocedural laboratory examination: Secondary | ICD-10-CM | POA: Insufficient documentation

## 2020-05-22 ENCOUNTER — Encounter: Payer: Self-pay | Admitting: Internal Medicine

## 2020-05-22 LAB — SARS CORONAVIRUS 2 (TAT 6-24 HRS): SARS Coronavirus 2: NEGATIVE

## 2020-05-23 ENCOUNTER — Encounter
Admission: RE | Disposition: A | Discharge status: Home / Self Care | Payer: Self-pay | Source: Home / Self Care | Attending: Internal Medicine

## 2020-05-23 ENCOUNTER — Ambulatory Visit: Payer: Medicare HMO | Admitting: Anesthesiology

## 2020-05-23 ENCOUNTER — Encounter: Payer: Self-pay | Admitting: Internal Medicine

## 2020-05-23 ENCOUNTER — Ambulatory Visit
Admission: RE | Admit: 2020-05-23 | Discharge: 2020-05-23 | Disposition: A | Discharge status: Home / Self Care | Payer: Medicare HMO | Attending: Internal Medicine | Admitting: Internal Medicine

## 2020-05-23 ENCOUNTER — Other Ambulatory Visit: Payer: Self-pay

## 2020-05-23 DIAGNOSIS — R1310 Dysphagia, unspecified: Secondary | ICD-10-CM | POA: Diagnosis not present

## 2020-05-23 DIAGNOSIS — Z79899 Other long term (current) drug therapy: Secondary | ICD-10-CM | POA: Insufficient documentation

## 2020-05-23 DIAGNOSIS — K219 Gastro-esophageal reflux disease without esophagitis: Secondary | ICD-10-CM | POA: Insufficient documentation

## 2020-05-23 DIAGNOSIS — Z86010 Personal history of colonic polyps: Secondary | ICD-10-CM | POA: Insufficient documentation

## 2020-05-23 DIAGNOSIS — J45909 Unspecified asthma, uncomplicated: Secondary | ICD-10-CM | POA: Insufficient documentation

## 2020-05-23 DIAGNOSIS — K2270 Barrett's esophagus without dysplasia: Secondary | ICD-10-CM | POA: Diagnosis not present

## 2020-05-23 DIAGNOSIS — K222 Esophageal obstruction: Secondary | ICD-10-CM | POA: Diagnosis not present

## 2020-05-23 DIAGNOSIS — K208 Other esophagitis without bleeding: Secondary | ICD-10-CM | POA: Diagnosis not present

## 2020-05-23 DIAGNOSIS — K209 Esophagitis, unspecified without bleeding: Secondary | ICD-10-CM | POA: Diagnosis not present

## 2020-05-23 DIAGNOSIS — I10 Essential (primary) hypertension: Secondary | ICD-10-CM | POA: Insufficient documentation

## 2020-05-23 HISTORY — DX: Allergy status to unspecified drugs, medicaments and biological substances: Z88.9

## 2020-05-23 HISTORY — DX: Essential (primary) hypertension: I10

## 2020-05-23 HISTORY — PX: ESOPHAGOGASTRODUODENOSCOPY (EGD) WITH PROPOFOL: SHX5813

## 2020-05-23 SURGERY — ESOPHAGOGASTRODUODENOSCOPY (EGD) WITH PROPOFOL
Anesthesia: General

## 2020-05-23 MED ORDER — SODIUM CHLORIDE 0.9 % IV SOLN
INTRAVENOUS | Status: DC
  Administered 2020-05-23: 1000 mL via INTRAVENOUS

## 2020-05-23 MED ORDER — LIDOCAINE HCL (CARDIAC) PF 100 MG/5ML IV SOSY
PREFILLED_SYRINGE | INTRAVENOUS | Status: DC | PRN
  Administered 2020-05-23: 100 mg via INTRAVENOUS

## 2020-05-23 MED ORDER — PROPOFOL 10 MG/ML IV BOLUS
INTRAVENOUS | Status: DC | PRN
  Administered 2020-05-23: 75 mg via INTRAVENOUS
  Administered 2020-05-23: 30 mg via INTRAVENOUS
  Administered 2020-05-23: 50 mg via INTRAVENOUS

## 2020-05-23 NOTE — Anesthesia Preprocedure Evaluation (Signed)
Anesthesia Evaluation  Patient identified by MRN, date of birth, ID band Patient awake    Reviewed: Allergy & Precautions, H&P , NPO status , Patient's Chart, lab work & pertinent test results, reviewed documented beta blocker date and time   Airway Mallampati: II   Neck ROM: full    Dental  (+) Poor Dentition   Pulmonary asthma , former smoker,    Pulmonary exam normal        Cardiovascular Exercise Tolerance: Good hypertension, On Medications Normal cardiovascular exam+ dysrhythmias  Rhythm:regular Rate:Normal     Neuro/Psych negative neurological ROS  negative psych ROS   GI/Hepatic Neg liver ROS, GERD  Medicated,  Endo/Other  negative endocrine ROS  Renal/GU negative Renal ROS  negative genitourinary   Musculoskeletal   Abdominal   Peds  Hematology negative hematology ROS (+)   Anesthesia Other Findings Past Medical History: No date: Allergic genetic state No date: Asthma No date: Dysrhythmia No date: GERD (gastroesophageal reflux disease) No date: Hypertension No date: Lumbago No date: Paroxysmal atrial tachycardia ([facility]) No date: Tubular adenoma of colon Past Surgical History: 1979: ABDOMINAL HYSTERECTOMY 1997: CARPAL TUNNEL RELEASE; Left 04/15/2017: CATARACT EXTRACTION W/PHACO; Right     Comment:  Procedure: CATARACT EXTRACTION PHACO AND INTRAOCULAR               LENS PLACEMENT ([place]);  Surgeon: Estill Cotta, MD;                Location: [facility] ORS;  Service: Ophthalmology;  Laterality:              Right;  Lot# [id number] H [place]: 01:02.8 AP%: 19.7 CDE: 25.43 05/13/2017: CATARACT EXTRACTION W/PHACO; Left     Comment:  Procedure: CATARACT EXTRACTION PHACO AND INTRAOCULAR               LENS PLACEMENT ([facility]);  Surgeon: Estill Cotta, MD;                Location: [facility] OR[facility];  Service: Ophthalmology;  Laterality:              Left;  [place] 1:07.6 AP% 22.9 CDE 31.29 Fluid Pack lot #                [id number] 10/09/2017: COLONOSCOPY WITH PROPOFOL; N/A     Comment:  Procedure: COLONOSCOPY WITH PROPOFOL;  Surgeon:               Lollie Sails, MD;  Location: [facility] ENDOSCOPY;                Service: Endoscopy;  Laterality: N/A; 10/09/2017: ESOPHAGOGASTRODUODENOSCOPY (EGD) WITH PROPOFOL; N/A     Comment:  Procedure: ESOPHAGOGASTRODUODENOSCOPY (EGD) WITH               PROPOFOL;  Surgeon: Lollie Sails, MD;  Location:               [facility] ENDOSCOPY;  Service: Endoscopy;  Laterality: N/A; 12/22/2017: ESOPHAGOGASTRODUODENOSCOPY (EGD) WITH PROPOFOL; N/A     Comment:  Procedure: ESOPHAGOGASTRODUODENOSCOPY (EGD) WITH               PROPOFOL;  Surgeon: Lollie Sails, MD;  Location:               [facility] ENDOSCOPY;  Service: Endoscopy;  Laterality: N/A; No date: EYE SURGERY 08/19/2019: KNEE ARTHROSCOPY WITH MEDIAL MENISECTOMY; Right     Comment:  Procedure: KNEE ARTHROSCOPY WITH PARTIAL MEDIAL  MENISECTOMY,  PLICA EXCISION;  Surgeon: Leim Fabry, MD;              Location: [place];  Service: Orthopedics;                Laterality: Right; 09/24/2018: SHOULDER ARTHROSCOPY WITH OPEN ROTATOR CUFF REPAIR; Left     Comment:  Procedure: SHOULDER ARTHROSCOPY WITH OPEN ROTATOR CUFF               REPAIR;  Surgeon: Leim Fabry, MD;  Location: [facility] ORS;               Service: Orthopedics;  Laterality: Left; No date: TMJ ARTHROPLASTY No date: TONSILLECTOMY AND ADENOIDECTOMY BMI    Body Mass Index: 27.96 kg/m     Reproductive/Obstetrics negative OB ROS                             Anesthesia Physical Anesthesia Plan  ASA: III  Anesthesia Plan: General   Post-op Pain Management:    Induction:   PONV Risk Score and Plan:   Airway Management Planned:   Additional Equipment:   Intra-op Plan:   Post-operative Plan:   Informed Consent: I have reviewed the patients History and Physical, chart, labs and discussed the procedure including the  risks, benefits and alternatives for the proposed anesthesia with the patient or authorized representative who has indicated his/her understanding and acceptance.     Dental Advisory Given  Plan Discussed with: CRNA  Anesthesia Plan Comments:         Anesthesia Quick Evaluation

## 2020-05-23 NOTE — Transfer of Care (Signed)
Immediate Anesthesia Transfer of Care Note  Patient: Traci Lynn  Procedure(s) Performed: ESOPHAGOGASTRODUODENOSCOPY (EGD) WITH PROPOFOL (N/A )  Patient Location: PACU  Anesthesia Type:General  Level of Consciousness: sedated  Airway & Oxygen Therapy: Patient Spontanous Breathing and Patient connected to nasal cannula oxygen  Post-op Assessment: Report given to RN and Post -op Vital signs reviewed and stable  Post vital signs: Reviewed and stable  Last Vitals:  Vitals Value Taken Time  BP 127/70 05/23/20 0942  Temp    Pulse 73 05/23/20 0943  Resp 21 05/23/20 0943  SpO2 96 % 05/23/20 0943  Vitals shown include unvalidated device data.  Last Pain:  Vitals:   05/23/20 0826  TempSrc: Temporal  PainSc: 0-No pain         Complications: No complications documented.

## 2020-05-23 NOTE — H&P (Signed)
Outpatient short stay form Pre-procedure 05/23/2020 9:14 AM Raahim Shartzer K. Alice Reichert, M.D.  Primary Physician: Dion Body, M.D.  Reason for visit:  Barrett's esophagus  History of present illness:  69 y/o patient has a personal history of Barrett's esophagus without dysplasia. Patient denies hemetemesis, nausea, vomiting, weight loss. Takes PPI without significant side effects.  Patient has a hx of intermittent dysphagia to the suprasternal notch. Has hx of schatzki ring on previous EGD in 2018.    Current Facility-Administered Medications:  .  0.9 %  sodium chloride infusion, , Intravenous, Continuous, [place], Benay Pike, MD, Last Rate: 20 mL/hr at 05/23/20 0837, 1,000 mL at 05/23/20 0837  Medications Prior to Admission  Medication Sig Dispense Refill Last Dose  . acetaminophen (TYLENOL) 500 MG tablet Take 2 tablets (1,000 mg total) by mouth every 8 (eight) hours. 90 tablet 2 Past Week at Unknown time  . albuterol (PROVENTIL HFA;VENTOLIN HFA) 108 (90 Base) MCG/ACT inhaler Inhale 1 puff into the lungs every 6 (six) hours as needed for wheezing or shortness of breath.    Past Week at Unknown time  . Ascorbic Acid (VITAMIN C PO) Take 1 tablet by mouth daily.   Past Week at Unknown time  . Calcium Carbonate-Vitamin D (CALCIUM 600+D PO) Take 1 tablet by mouth 2 (two) times daily.    Past Week at Unknown time  . cetirizine (ZYRTEC) 10 MG tablet Take 10 mg by mouth daily.   05/22/2020 at Unknown time  . Cholecalciferol (VITAMIN D3) 5000 units CAPS Take 5,000 Units by mouth daily.   Past Week at Unknown time  . EPINEPHrine (EPI-PEN) 0.3 mg/0.3 mL SOAJ injection Inject 0.3 mg into the muscle once.    Past Week at Unknown time  . fluticasone (FLONASE) 50 MCG/ACT nasal spray Place 1 spray into both nostrils daily as needed for allergies.    05/22/2020 at Unknown time  . folic acid (FOLVITE) 1 MG tablet Take 1 mg by mouth daily.   Past Week at Unknown time  . HYDROcodone-acetaminophen (NORCO) 5-325 MG  tablet Take 1-2 tablets by mouth every 4 (four) hours as needed for moderate pain or severe pain. 10 tablet 0 Past Week at Unknown time  . levocetirizine (XYZAL) 5 MG tablet Take 5 mg by mouth every evening.   05/22/2020 at Unknown time  . metoprolol tartrate (LOPRESSOR) 25 MG tablet Take 50 mg by mouth at bedtime.   05/22/2020 at Unknown time  . Multiple Vitamins-Minerals (MULTIVITAMIN WITH MINERALS) tablet Take 1 tablet by mouth daily.   Past Week at Unknown time  . ondansetron (ZOFRAN ODT) 4 MG disintegrating tablet Take 1 tablet (4 mg total) by mouth every 8 (eight) hours as needed for nausea or vomiting. 20 tablet 0 Past Week at Unknown time  . pantoprazole (PROTONIX) 40 MG tablet Take 40 mg by mouth daily before breakfast.   05/22/2020 at Unknown time  . VITAMIN E PO Take 1 capsule by mouth daily.   Past Week at Unknown time     Allergies  Allergen Reactions  . Pork-Derived Products Anaphylaxis  . Penicillin V Potassium Hives, Rash and Other (See Comments)    Has patient had a PCN reaction causing immediate rash, facial/tongue/throat swelling, SOB or lightheadedness with hypotension: Yes Has patient had a PCN reaction causing severe rash involving mucus membranes or skin necrosis: Yes Has patient had a PCN reaction that required hospitalization: No Has patient had a PCN reaction occurring within the last 10 years: No If all of the  above answers are "NO", then may proceed with Cephalosporin use.   . Bee Venom Swelling  . Shellfish Allergy Hives and Swelling  . Penicillins Rash     Past Medical History:  Diagnosis Date  . Allergic genetic state   . Asthma   . Dysrhythmia   . GERD (gastroesophageal reflux disease)   . Hypertension   . Lumbago   . Paroxysmal atrial tachycardia ([place])   . Tubular adenoma of colon     Review of systems:  Otherwise negative.    Physical Exam  Gen: Alert, oriented. Appears stated age.  HEENT: [place]/AT. PERRLA. Lungs: CTA, no wheezes. CV: RR nl  S1, S2. Abd: soft, benign, no masses. BS+ Ext: No edema. Pulses 2+    Planned procedures: Proceed with EGD. The patient understands the nature of the planned procedure, indications, risks, alternatives and potential complications including but not limited to bleeding, infection, perforation, damage to internal organs and possible oversedation/side effects from anesthesia. The patient agrees and gives consent to proceed.  Please refer to procedure notes for findings, recommendations and patient disposition/instructions.     Ascencion Stegner K. Alice Reichert, M.D. Gastroenterology 05/23/2020  9:14 AM

## 2020-05-23 NOTE — Interval H&P Note (Signed)
History and Physical Interval Note:  05/23/2020 9:17 AM  Traci Lynn  has presented today for surgery, with the diagnosis of BARRETT'S.  The various methods of treatment have been discussed with the patient and family. After consideration of risks, benefits and other options for treatment, the patient has consented to  Procedure(s): ESOPHAGOGASTRODUODENOSCOPY (EGD) WITH PROPOFOL (N/A) as a surgical intervention.  The patient's history has been reviewed, patient examined, no change in status, stable for surgery.  I have reviewed the patient's chart and labs.  Questions were answered to the patient's satisfaction.     [place], [place]

## 2020-05-23 NOTE — Op Note (Addendum)
[facility] Gastroenterology Patient Name: Traci Lynn Procedure Date: 05/23/2020 9:20 AM MRN: [id number] Account #: [id number] Date of Birth: 28-Sep-1951 Admit Type: Outpatient Age: 69 Room: [facility] ENDO ROOM 3 Gender: Female Note Status: Supervisor Override Procedure:             Upper GI endoscopy Indications:           Barrett's esophagus, Dysphagia Providers:             Benay Pike. Alice Reichert MD, MD Referring MD:          Dion Body (Referring MD) Medicines:             Propofol per Anesthesia Complications:         No immediate complications. Procedure:             Pre-Anesthesia Assessment:                        - Prior to the procedure, a History and Physical was                         performed, and patient medications, allergies and                         sensitivities were reviewed. The patient's tolerance                         of previous anesthesia was reviewed.                        - The risks and benefits of the procedure and the                         sedation options and risks were discussed with the                         patient. All questions were answered and informed                         consent was obtained.                        - Patient identification and proposed procedure were                         verified prior to the procedure by the nurse. The                         procedure was verified in the procedure room.                        - ASA Grade Assessment: III - A patient with severe                         systemic disease.                        - After reviewing the risks and benefits, the patient                         was  deemed in satisfactory condition to undergo the                         procedure.                        After obtaining informed consent, the endoscope was                         passed under direct vision. Throughout the procedure,                         the patient's blood pressure,  pulse, and oxygen                         saturations were monitored continuously. The Endoscope                         was introduced through the mouth, and advanced to the                         third part of duodenum. The upper GI endoscopy was                         accomplished without difficulty. The patient tolerated                         the procedure well. Findings:      A non-obstructing Schatzki ring was found at the gastroesophageal       junction. The scope was withdrawn. Dilation was performed with a Maloney       dilator with no resistance at 38 Fr.      The esophagus and gastroesophageal junction were examined with white       light. There was no visual evidence of Barrett's esophagus. Mucosa was       biopsied with a cold forceps for histology in 4 quadrants at the       gastroesophageal junction. One specimen bottle was sent to pathology.      The entire examined stomach was normal.      The examined duodenum was normal.      The cardia and gastric fundus were normal on retroflexion. Impression:            - Non-obstructing Schatzki ring. Dilated.                        - There is no endoscopic evidence of Barrett's                         esophagus. Biopsied.                        - Normal stomach.                        - Normal examined duodenum. Recommendation:        - Patient has a contact number available for                         emergencies. The signs and symptoms of potential  delayed complications were discussed with the patient.                         Return to normal activities tomorrow. Written                         discharge instructions were provided to the patient.                        - Resume previous diet.                        - Continue present medications.                        - Await pathology results.                        - Repeat upper endoscopy after studies are complete                         for  surveillance.                        - Monitor results to esophageal dilation                        - Return to physician assistant in 3 months. Procedure Code(s):     --- Professional ---                        [phone], Esophagogastroduodenoscopy, flexible,                         transoral; with biopsy, single or multiple                        43450, Dilation of esophagus, by unguided sound or                         bougie, single or multiple passes Diagnosis Code(s):     --- Professional ---                        K22.2, Esophageal obstruction CPT copyright 2019 American Medical Association. All rights reserved. The codes documented in this report are preliminary and upon coder review may  be revised to meet current compliance requirements. Efrain Sella MD, MD 05/23/2020 9:38:16 AM This report has been signed electronically. Number of Addenda: 0 Note Initiated On: 05/23/2020 9:20 AM Estimated Blood Loss:  Estimated blood loss: none.      [facility]

## 2020-05-24 LAB — SURGICAL PATHOLOGY

## 2020-05-24 NOTE — Anesthesia Postprocedure Evaluation (Signed)
Anesthesia Post Note  Patient: Traci Lynn  Procedure(s) Performed: ESOPHAGOGASTRODUODENOSCOPY (EGD) WITH PROPOFOL (N/A )  Patient location during evaluation: PACU Anesthesia Type: General Level of consciousness: awake and alert Pain management: pain level controlled Vital Signs Assessment: post-procedure vital signs reviewed and stable Respiratory status: spontaneous breathing, nonlabored ventilation, respiratory function stable and patient connected to nasal cannula oxygen Cardiovascular status: blood pressure returned to baseline and stable Postop Assessment: no apparent nausea or vomiting Anesthetic complications: no   No complications documented.   Last Vitals:  Vitals:   05/23/20 0952 05/23/20 1010  BP: 117/73 (!) 144/64  Pulse:    Resp:    Temp:    SpO2:      Last Pain:  Vitals:   05/24/20 0719  TempSrc:   PainSc: 0-No pain                 Molli Barrows

## 2020-05-25 ENCOUNTER — Encounter: Payer: Self-pay | Admitting: Internal Medicine

## 2020-07-19 DIAGNOSIS — Z8679 Personal history of other diseases of the circulatory system: Secondary | ICD-10-CM | POA: Diagnosis not present

## 2020-07-27 DIAGNOSIS — Z20828 Contact with and (suspected) exposure to other viral communicable diseases: Secondary | ICD-10-CM | POA: Diagnosis not present

## 2020-07-27 DIAGNOSIS — J019 Acute sinusitis, unspecified: Secondary | ICD-10-CM | POA: Diagnosis not present

## 2020-07-27 DIAGNOSIS — J029 Acute pharyngitis, unspecified: Secondary | ICD-10-CM | POA: Diagnosis not present

## 2020-08-10 DIAGNOSIS — M25552 Pain in left hip: Secondary | ICD-10-CM | POA: Diagnosis not present

## 2020-08-10 DIAGNOSIS — M1612 Unilateral primary osteoarthritis, left hip: Secondary | ICD-10-CM | POA: Diagnosis not present

## 2020-08-10 DIAGNOSIS — Z8679 Personal history of other diseases of the circulatory system: Secondary | ICD-10-CM | POA: Diagnosis not present

## 2020-08-10 DIAGNOSIS — M5442 Lumbago with sciatica, left side: Secondary | ICD-10-CM | POA: Diagnosis not present

## 2020-08-10 DIAGNOSIS — M545 Low back pain: Secondary | ICD-10-CM | POA: Diagnosis not present

## 2020-08-10 DIAGNOSIS — M8589 Other specified disorders of bone density and structure, multiple sites: Secondary | ICD-10-CM | POA: Diagnosis not present

## 2020-08-10 DIAGNOSIS — M47816 Spondylosis without myelopathy or radiculopathy, lumbar region: Secondary | ICD-10-CM | POA: Diagnosis not present

## 2020-09-17 DIAGNOSIS — Z0000 Encounter for general adult medical examination without abnormal findings: Secondary | ICD-10-CM | POA: Diagnosis not present

## 2020-09-17 DIAGNOSIS — Z136 Encounter for screening for cardiovascular disorders: Secondary | ICD-10-CM | POA: Diagnosis not present

## 2020-09-17 DIAGNOSIS — Z20822 Contact with and (suspected) exposure to covid-19: Secondary | ICD-10-CM | POA: Diagnosis not present

## 2020-09-17 DIAGNOSIS — Z8679 Personal history of other diseases of the circulatory system: Secondary | ICD-10-CM | POA: Diagnosis not present

## 2020-09-17 DIAGNOSIS — Z1159 Encounter for screening for other viral diseases: Secondary | ICD-10-CM | POA: Diagnosis not present

## 2020-09-17 DIAGNOSIS — M8589 Other specified disorders of bone density and structure, multiple sites: Secondary | ICD-10-CM | POA: Diagnosis not present

## 2020-09-17 DIAGNOSIS — Z131 Encounter for screening for diabetes mellitus: Secondary | ICD-10-CM | POA: Diagnosis not present

## 2020-09-24 DIAGNOSIS — Z0000 Encounter for general adult medical examination without abnormal findings: Secondary | ICD-10-CM | POA: Diagnosis not present

## 2020-09-24 DIAGNOSIS — D696 Thrombocytopenia, unspecified: Secondary | ICD-10-CM | POA: Diagnosis not present

## 2020-09-24 DIAGNOSIS — Z23 Encounter for immunization: Secondary | ICD-10-CM | POA: Diagnosis not present

## 2020-10-02 ENCOUNTER — Encounter: Payer: Self-pay | Admitting: Oncology

## 2020-10-02 ENCOUNTER — Inpatient Hospital Stay: Payer: Medicare HMO

## 2020-10-02 ENCOUNTER — Other Ambulatory Visit: Payer: Self-pay

## 2020-10-02 ENCOUNTER — Inpatient Hospital Stay: Payer: Medicare HMO | Attending: Oncology | Admitting: Oncology

## 2020-10-02 VITALS — BP 134/86 | HR 82 | Temp 99.0°F | Resp 18 | Wt 175.6 lb

## 2020-10-02 DIAGNOSIS — Z79899 Other long term (current) drug therapy: Secondary | ICD-10-CM | POA: Insufficient documentation

## 2020-10-02 DIAGNOSIS — Z90710 Acquired absence of both cervix and uterus: Secondary | ICD-10-CM | POA: Insufficient documentation

## 2020-10-02 DIAGNOSIS — K219 Gastro-esophageal reflux disease without esophagitis: Secondary | ICD-10-CM | POA: Diagnosis not present

## 2020-10-02 DIAGNOSIS — I10 Essential (primary) hypertension: Secondary | ICD-10-CM | POA: Diagnosis not present

## 2020-10-02 DIAGNOSIS — D696 Thrombocytopenia, unspecified: Secondary | ICD-10-CM

## 2020-10-02 DIAGNOSIS — Z87891 Personal history of nicotine dependence: Secondary | ICD-10-CM | POA: Diagnosis not present

## 2020-10-02 DIAGNOSIS — Z791 Long term (current) use of non-steroidal anti-inflammatories (NSAID): Secondary | ICD-10-CM | POA: Insufficient documentation

## 2020-10-02 DIAGNOSIS — J45909 Unspecified asthma, uncomplicated: Secondary | ICD-10-CM | POA: Insufficient documentation

## 2020-10-02 DIAGNOSIS — Z800 Family history of malignant neoplasm of digestive organs: Secondary | ICD-10-CM | POA: Diagnosis not present

## 2020-10-02 LAB — CBC WITH DIFFERENTIAL/PLATELET
Abs Immature Granulocytes: 0.04 10*3/uL (ref 0.00–0.07)
Basophils Absolute: 0 10*3/uL (ref 0.0–0.1)
Basophils Relative: 1 %
Eosinophils Absolute: 0.2 10*3/uL (ref 0.0–0.5)
Eosinophils Relative: 2 %
HCT: 40.2 % (ref 36.0–46.0)
Hemoglobin: 14 g/dL (ref 12.0–15.0)
Immature Granulocytes: 1 %
Lymphocytes Relative: 32 %
Lymphs Abs: 2.2 10*3/uL (ref 0.7–4.0)
MCH: 32 pg (ref 26.0–34.0)
MCHC: 34.8 g/dL (ref 30.0–36.0)
MCV: 92 fL (ref 80.0–100.0)
Monocytes Absolute: 0.5 10*3/uL (ref 0.1–1.0)
Monocytes Relative: 7 %
Neutro Abs: 4 10*3/uL (ref 1.7–7.7)
Neutrophils Relative %: 57 %
Platelets: 117 10*3/uL — ABNORMAL LOW (ref 150–400)
RBC: 4.37 MIL/uL (ref 3.87–5.11)
RDW: 12.5 % (ref 11.5–15.5)
WBC: 7 10*3/uL (ref 4.0–10.5)
nRBC: 0 % (ref 0.0–0.2)

## 2020-10-02 LAB — TECHNOLOGIST SMEAR REVIEW: Plt Morphology: NORMAL

## 2020-10-02 LAB — VITAMIN B12: Vitamin B-12: 483 pg/mL (ref 180–914)

## 2020-10-02 LAB — FOLATE: Folate: 14.5 ng/mL (ref >5.9)

## 2020-10-02 LAB — LACTATE DEHYDROGENASE: LDH: 167 U/L (ref 98–192)

## 2020-10-06 ENCOUNTER — Encounter: Payer: Self-pay | Admitting: Oncology

## 2020-10-06 NOTE — Progress Notes (Signed)
Hematology/Oncology Consult note [facility] [phone] [phone]  Patient Care Team: Dion Body, MD as PCP - General (Family Medicine) Dion Body, MD (Family Medicine)   Name of the patient: Traci Lynn  [id number]  01/31/1951    Reason for referral-thrombocytopenia   Referring physician-Dr. Netty Starring  Date of visit: 10/06/20   History of presenting illness- Patient is a 69 year old female with a past medical history significant for GERD, hypertension, Barrett's esophagus who has been referred for thrombocytopenia.  Most recent CBC from 09/17/2020 showed white count of 6.4, H&H of 14.4/42.2 and a platelet count of 113.  Looking back at her prior platelet counts in 2018 her platelet count was 147 and then 91 in April 2020 and 129 in October 2020.  She has had a normal white count and hemoglobin consistently.  Hepatitis C testing was negative.  CMP was normal except for mildly elevated alkaline phosphatase of 147.  Patient is doing well and denies any changes in her appetite or easy bruising.  Denies any unintentional weight loss  ECOG PS- 1  Pain scale- 0   Review of systems- Review of Systems  Constitutional: Negative for chills, fever, malaise/fatigue and weight loss.  HENT: Negative for congestion, ear discharge and nosebleeds.   Eyes: Negative for blurred vision.  Respiratory: Negative for cough, hemoptysis, sputum production, shortness of breath and wheezing.   Cardiovascular: Negative for chest pain, palpitations, orthopnea and claudication.  Gastrointestinal: Negative for abdominal pain, blood in stool, constipation, diarrhea, heartburn, melena, nausea and vomiting.  Genitourinary: Negative for dysuria, flank pain, frequency, hematuria and urgency.  Musculoskeletal: Negative for back pain, joint pain and myalgias.  Skin: Negative for rash.  Neurological: Negative for dizziness, tingling, focal weakness,  seizures, weakness and headaches.  Endo/Heme/Allergies: Does not bruise/bleed easily.  Psychiatric/Behavioral: Negative for depression and suicidal ideas. The patient does not have insomnia.     Allergies  Allergen Reactions  . Pork-Derived Products Anaphylaxis  . Penicillin V Potassium Hives, Rash and Other (See Comments)    Has patient had a PCN reaction causing immediate rash, facial/tongue/throat swelling, SOB or lightheadedness with hypotension: Yes Has patient had a PCN reaction causing severe rash involving mucus membranes or skin necrosis: Yes Has patient had a PCN reaction that required hospitalization: No Has patient had a PCN reaction occurring within the last 10 years: No If all of the above answers are "NO", then may proceed with Cephalosporin use.   . Bee Venom Swelling  . Shellfish Allergy Hives and Swelling    Patient Active Problem List   Diagnosis Date Noted  . Left arm pain 01/20/2014     Past Medical History:  Diagnosis Date  . Allergic genetic state   . Asthma   . Dysrhythmia   . GERD (gastroesophageal reflux disease)   . Hypertension   . Lumbago   . Paroxysmal atrial tachycardia ([place])   . Tubular adenoma of colon      Past Surgical History:  Procedure Laterality Date  . ABDOMINAL HYSTERECTOMY  1979  . CARPAL TUNNEL RELEASE Left 1997  . CATARACT EXTRACTION W/PHACO Right 04/15/2017   Procedure: CATARACT EXTRACTION PHACO AND INTRAOCULAR LENS PLACEMENT (IOC);  Surgeon: Estill Cotta, MD;  Location: [facility] ORS;  Service: Ophthalmology;  Laterality: Right;  Lot# [id number] H [place]: 01:02.8 AP%: 19.7 CDE: 25.43  . CATARACT EXTRACTION W/PHACO Left 05/13/2017   Procedure: CATARACT EXTRACTION PHACO AND INTRAOCULAR LENS PLACEMENT (IOC);  Surgeon: Estill Cotta, MD;  Location: [facility] ORS;  Service: Ophthalmology;  Laterality: Left;  [place] 1:07.6 AP% 22.9 CDE 31.29 Fluid Pack lot # [id number]  . COLONOSCOPY WITH PROPOFOL N/A 10/09/2017   Procedure: COLONOSCOPY WITH  PROPOFOL;  Surgeon: Lollie Sails, MD;  Location: [facility] ENDOSCOPY;  Service: Endoscopy;  Laterality: N/A;  . ESOPHAGOGASTRODUODENOSCOPY (EGD) WITH PROPOFOL N/A 10/09/2017   Procedure: ESOPHAGOGASTRODUODENOSCOPY (EGD) WITH PROPOFOL;  Surgeon: Lollie Sails, MD;  Location: [facility] ENDOSCOPY;  Service: Endoscopy;  Laterality: N/A;  . ESOPHAGOGASTRODUODENOSCOPY (EGD) WITH PROPOFOL N/A 12/22/2017   Procedure: ESOPHAGOGASTRODUODENOSCOPY (EGD) WITH PROPOFOL;  Surgeon: Lollie Sails, MD;  Location: [facility] ENDOSCOPY;  Service: Endoscopy;  Laterality: N/A;  . ESOPHAGOGASTRODUODENOSCOPY (EGD) WITH PROPOFOL N/A 05/23/2020   Procedure: ESOPHAGOGASTRODUODENOSCOPY (EGD) WITH PROPOFOL;  Surgeon: [place], Benay Pike, MD;  Location: [facility] ENDOSCOPY;  Service: Gastroenterology;  Laterality: N/A;  . EYE SURGERY    . KNEE ARTHROSCOPY WITH MEDIAL MENISECTOMY Right 08/19/2019   Procedure: KNEE ARTHROSCOPY WITH PARTIAL MEDIAL MENISECTOMY,  PLICA EXCISION;  Surgeon: Leim Fabry, MD;  Location: [place];  Service: Orthopedics;  Laterality: Right;  . SHOULDER ARTHROSCOPY WITH OPEN ROTATOR CUFF REPAIR Left 09/24/2018   Procedure: SHOULDER ARTHROSCOPY WITH OPEN ROTATOR CUFF REPAIR;  Surgeon: Leim Fabry, MD;  Location: [facility] ORS;  Service: Orthopedics;  Laterality: Left;  . TMJ ARTHROPLASTY    . TONSILLECTOMY AND ADENOIDECTOMY      Social History   Socioeconomic History  . Marital status: Married    Spouse name: Not on file  . Number of children: Not on file  . Years of education: Not on file  . Highest education level: Not on file  Occupational History  . Occupation: Solicitor  Tobacco Use  . Smoking status: Former Smoker    Types: Cigarettes    Quit date: 06/01/1971    Years since quitting: 49.3  . Smokeless tobacco: Never Used  Vaping Use  . Vaping Use: Never used  Substance and Sexual Activity  . Alcohol use: No  . Drug use: No  . Sexual activity: Not on file  Other Topics Concern    . Not on file  Social History Narrative   Lives with husband, married 7 years.  She has one son.   Retired from [employer].    Highest level of education:  4 years of college   Social Determinants of Health   Financial Resource Strain:   . Difficulty of Paying Living Expenses: Not on file  Food Insecurity:   . Worried About Charity fundraiser in the Last Year: Not on file  . Ran Out of Food in the Last Year: Not on file  Transportation Needs:   . Lack of Transportation (Medical): Not on file  . Lack of Transportation (Non-Medical): Not on file  Physical Activity:   . Days of Exercise per Week: Not on file  . Minutes of Exercise per Session: Not on file  Stress:   . Feeling of Stress : Not on file  Social Connections:   . Frequency of Communication with Friends and Family: Not on file  . Frequency of Social Gatherings with Friends and Family: Not on file  . Attends Religious Services: Not on file  . Active Member of Clubs or Organizations: Not on file  . Attends Archivist Meetings: Not on file  . Marital Status: Not on file  Intimate Partner Violence:   . Fear of Current or Ex-Partner: Not on file  . Emotionally Abused: Not on file  . Physically Abused: Not on file  .  Sexually Abused: Not on file     Family History  Problem Relation Age of Onset  . Kidney disease Father        Deceased, 60  . Colon cancer Mother        Deceased 6  . Healthy Son   . Breast cancer Neg Hx      Current Outpatient Medications:  .  albuterol (PROVENTIL HFA;VENTOLIN HFA) 108 (90 Base) MCG/ACT inhaler, Inhale 1 puff into the lungs every 6 (six) hours as needed for wheezing or shortness of breath. , Disp: , Rfl:  .  Ascorbic Acid (VITAMIN C PO), Take 1 tablet by mouth daily., Disp: , Rfl:  .  baclofen (LIORESAL) 10 MG tablet, Take 10 mg by mouth 3 (three) times daily., Disp: , Rfl:  .  Calcium Carbonate-Vitamin D (CALCIUM 600+D PO), Take 1 tablet by mouth 2 (two) times daily. ,  Disp: , Rfl:  .  cetirizine (ZYRTEC) 10 MG tablet, Take 10 mg by mouth daily., Disp: , Rfl:  .  Cholecalciferol (VITAMIN D3) 5000 units CAPS, Take 5,000 Units by mouth daily., Disp: , Rfl:  .  EPINEPHrine (EPI-PEN) 0.3 mg/0.3 mL SOAJ injection, Inject 0.3 mg into the muscle once. , Disp: , Rfl:  .  fluticasone (FLONASE) 50 MCG/ACT nasal spray, Place 1 spray into both nostrils daily as needed for allergies. , Disp: , Rfl:  .  folic acid (FOLVITE) 1 MG tablet, Take 1 mg by mouth daily., Disp: , Rfl:  .  meloxicam (MOBIC) 15 MG tablet, Take 15 mg by mouth daily as needed., Disp: , Rfl:  .  metoprolol tartrate (LOPRESSOR) 25 MG tablet, Take 50 mg by mouth at bedtime., Disp: , Rfl:  .  Multiple Vitamins-Minerals (MULTIVITAMIN WITH MINERALS) tablet, Take 1 tablet by mouth daily., Disp: , Rfl:  .  VITAMIN E PO, Take 1 capsule by mouth daily., Disp: , Rfl:  .  HYDROcodone-acetaminophen (NORCO) 5-325 MG tablet, Take 1-2 tablets by mouth every 4 (four) hours as needed for moderate pain or severe pain., Disp: 10 tablet, Rfl: 0 .  levocetirizine (XYZAL) 5 MG tablet, Take 5 mg by mouth every evening., Disp: , Rfl:  .  ondansetron (ZOFRAN ODT) 4 MG disintegrating tablet, Take 1 tablet (4 mg total) by mouth every 8 (eight) hours as needed for nausea or vomiting., Disp: 20 tablet, Rfl: 0 .  pantoprazole (PROTONIX) 40 MG tablet, Take 40 mg by mouth daily before breakfast., Disp: , Rfl:    Physical exam:  Vitals:   10/02/20 1511  BP: 134/86  Pulse: 82  Resp: 18  Temp: 99 F (37.2 C)  TempSrc: Tympanic  SpO2: 99%  Weight: 175 lb 9.6 oz (79.7 kg)   Physical Exam Constitutional:      General: She is not in acute distress. Cardiovascular:     Rate and Rhythm: Normal rate and regular rhythm.     Heart sounds: Normal heart sounds.  Pulmonary:     Effort: Pulmonary effort is normal.     Breath sounds: Normal breath sounds.  Abdominal:     General: Bowel sounds are normal.     Palpations: Abdomen is  soft.  Skin:    General: Skin is warm and dry.  Neurological:     Mental Status: She is alert and oriented to person, place, and time.        CMP Latest Ref Rng & Units 09/20/2018  Glucose 70 - 99 mg/dL 83  BUN 8 - 23 mg/dL  10  Creatinine 0.44 - 1.00 mg/dL 0.66  Sodium 135 - 145 mmol/L 141  Potassium 3.5 - 5.1 mmol/L 3.8  Chloride 98 - 111 mmol/L 105  CO2 22 - 32 mmol/L 30  Calcium 8.9 - 10.3 mg/dL 9.7  Total Protein 6.5 - 8.1 g/dL -  Total Bilirubin 0.3 - 1.2 mg/dL -  Alkaline Phos 38 - 126 U/L -  AST 15 - 41 U/L -  ALT 14 - 54 U/L -   CBC Latest Ref Rng & Units 10/02/2020  WBC 4.0 - 10.5 K/uL 7.0  Hemoglobin 12.0 - 15.0 g/dL 14.0  Hematocrit 36 - 46 % 40.2  Platelets 150 - 400 K/uL 117(L)    Assessment and plan- Patient is a 69 y.o. female referred for thrombocytopenia  Patient has mild isolated thrombocytopenia in the absence of other cytopenias.  No significant bleeding or bruising.Recent hepatitis C testing was negative.  She is not on any medications that would cause obvious thrombocytopenia.  I will check smear review, CBC with differential, B12 and folate.  At this time I am inclined to monitor her counts conservatively without the need for a bone marrow biopsy.  If she continues to have isolated thrombocytopenia treatment can be considered only when platelet counts are less than 30.  I will see her back in 1 week's time for a video visit.  Explained to patient what ITP is and indications for treatment   Thank you for this kind referral and the opportunity to participate in the care of this patient   Visit Diagnosis 1. Thrombocytopenia ([place])     Dr. Randa Evens, MD, MPH [facility] at [facility] [phone] 10/06/2020 3:08 PM

## 2020-10-08 ENCOUNTER — Encounter: Payer: Self-pay | Admitting: Oncology

## 2020-10-08 ENCOUNTER — Inpatient Hospital Stay: Payer: Medicare HMO | Attending: Oncology | Admitting: Oncology

## 2020-10-08 ENCOUNTER — Other Ambulatory Visit: Payer: Self-pay

## 2020-10-08 VITALS — BP 137/72 | HR 74 | Temp 98.0°F | Resp 20 | Wt 173.3 lb

## 2020-10-08 DIAGNOSIS — Z87891 Personal history of nicotine dependence: Secondary | ICD-10-CM | POA: Diagnosis not present

## 2020-10-08 DIAGNOSIS — Z79899 Other long term (current) drug therapy: Secondary | ICD-10-CM | POA: Insufficient documentation

## 2020-10-08 DIAGNOSIS — D696 Thrombocytopenia, unspecified: Secondary | ICD-10-CM | POA: Diagnosis not present

## 2020-10-08 DIAGNOSIS — K2270 Barrett's esophagus without dysplasia: Secondary | ICD-10-CM | POA: Insufficient documentation

## 2020-10-08 DIAGNOSIS — R948 Abnormal results of function studies of other organs and systems: Secondary | ICD-10-CM | POA: Insufficient documentation

## 2020-10-08 DIAGNOSIS — I10 Essential (primary) hypertension: Secondary | ICD-10-CM | POA: Diagnosis not present

## 2020-10-08 DIAGNOSIS — K219 Gastro-esophageal reflux disease without esophagitis: Secondary | ICD-10-CM | POA: Diagnosis not present

## 2020-10-12 NOTE — Progress Notes (Signed)
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  Most recent CBC from 09/17/2020 showed white count of 6.4, H&H of 14.4/42.2 and a platelet count of 113.  Looking back at her prior platelet counts in 2018 her platelet count was 147 and then 91 in April 2020 and 129 in October 2020.  She has had a normal white count and hemoglobin consistently.  Hepatitis C testing was negative.  CMP was normal except for mildly elevated alkaline phosphatase of 147.  Patient is doing well and denies any changes in her appetite or easy bruising.    Blood work from 10/02/2020 were as follows: CBC showed white count of 7, H&H of 14/40.2 and a platelet count of 117.  B12 folate and LDH were normal.  Smear review was unremarkable.  Interval history-no changes since last visit.  She is doing well and denies any specific complaints at this time  ECOG PS- 1 Pain scale- 0   Review of systems- Review of Systems  Constitutional: Negative for chills, fever, malaise/fatigue and weight loss.  HENT: Negative for congestion, ear discharge and nosebleeds.   Eyes: Negative for blurred vision.  Respiratory: Negative for cough, hemoptysis, sputum production, shortness of breath and wheezing.   Cardiovascular: Negative for chest pain, palpitations, orthopnea and claudication.  Gastrointestinal: Negative for abdominal pain, blood in stool, constipation, diarrhea,  heartburn, melena, nausea and vomiting.  Genitourinary: Negative for dysuria, flank pain, frequency, hematuria and urgency.  Musculoskeletal: Negative for back pain, joint pain and myalgias.  Skin: Negative for rash.  Neurological: Negative for dizziness, tingling, focal weakness, seizures, weakness and headaches.  Endo/Heme/Allergies: Does not bruise/bleed easily.  Psychiatric/Behavioral: Negative for depression and suicidal ideas. The patient does not have insomnia.       Allergies  Allergen Reactions   Pork-Derived Products Anaphylaxis   Penicillin V Potassium Hives, Rash and Other (See Comments)    Has patient had a PCN reaction causing immediate rash, facial/tongue/throat swelling, SOB or lightheadedness with hypotension: Yes Has patient had a PCN reaction causing severe rash involving mucus membranes or skin necrosis: Yes Has patient had a PCN reaction that required hospitalization: No Has patient had a PCN reaction occurring within the last 10 years: No If all of the above answers are "NO", then may proceed with Cephalosporin use.    Bee Venom Swelling   Shellfish Allergy Hives and Swelling     Past Medical History:  Diagnosis Date   Allergic genetic state    Asthma    Dysrhythmia    GERD (gastroesophageal reflux disease)    Hypertension    Lumbago    Paroxysmal atrial tachycardia (HCC)    Tubular adenoma of colon      Past Surgical History:  Procedure Laterality Date   ABDOMINAL HYSTERECTOMY  1979   CARPAL TUNNEL RELEASE Left 1997   CATARACT EXTRACTION W/PHACO Right 04/15/2017   Procedure: CATARACT EXTRACTION PHACO AND INTRAOCULAR LENS PLACEMENT ([place]);  Surgeon: Estill Cotta, MD;  Location: [facility] ORS;  Service: Ophthalmology;  Laterality: Right;  Lot# [id number] H [place]: 01:02.8 AP%: 19.7 CDE: 25.43   CATARACT EXTRACTION W/PHACO Left 05/13/2017   Procedure: CATARACT EXTRACTION PHACO AND INTRAOCULAR LENS PLACEMENT (I[facility]);  Surgeon: Estill Cotta,  MD;  Location: [facility] ORS;  Service: Ophthalmology;  Laterality: Left;  [place] 1:07.6 AP% 22.9 CDE 31.29 Fluid Pack lot # [id number]   COLONOSCOPY WITH PROPOFOL N/A 10/09/2017   Procedure: COLONOSCOPY WITH PROPOFOL;  Surgeon: Lollie Sails, MD;  Location: [facility] ENDOSCOPY;  Service: Endoscopy;  Laterality: N/A;   ESOPHAGOGASTRODUODENOSCOPY (EGD) WITH PROPOFOL N/A 10/09/2017   Procedure: ESOPHAGOGASTRODUODENOSCOPY (EGD) WITH PROPOFOL;  Surgeon: Lollie Sails, MD;  Location: [facility] ENDOSCOPY;  Service: Endoscopy;  Laterality: N/A;   ESOPHAGOGASTRODUODENOSCOPY (EGD) WITH PROPOFOL N/A 12/22/2017   Procedure: ESOPHAGOGASTRODUODENOSCOPY (EGD) WITH PROPOFOL;  Surgeon: Lollie Sails, MD;  Location: [facility] ENDOSCOPY;  Service: Endoscopy;  Laterality: N/A;   ESOPHAGOGASTRODUODENOSCOPY (EGD) WITH PROPOFOL N/A 05/23/2020   Procedure: ESOPHAGOGASTRODUODENOSCOPY (EGD) WITH PROPOFOL;  Surgeon: Toledo, Benay Pike, MD;  Location: [facility] ENDOSCOPY;  Service: Gastroenterology;  Laterality: N/A;   EYE SURGERY     KNEE ARTHROSCOPY WITH MEDIAL MENISECTOMY Right 08/19/2019   Procedure: KNEE ARTHROSCOPY WITH PARTIAL MEDIAL MENISECTOMY,  PLICA EXCISION;  Surgeon: Leim Fabry, MD;  Location: [place];  Service: Orthopedics;  Laterality: Right;   SHOULDER ARTHROSCOPY WITH OPEN ROTATOR CUFF REPAIR Left 09/24/2018   Procedure: SHOULDER ARTHROSCOPY WITH OPEN ROTATOR CUFF REPAIR;  Surgeon: Leim Fabry, MD;  Location: [facility] ORS;  Service: Orthopedics;  Laterality: Left;   TMJ ARTHROPLASTY     TONSILLECTOMY AND ADENOIDECTOMY      Social History   Socioeconomic History   Marital status: Married    Spouse name: Not on file   Number of children: Not on file   Years of education: Not on file   Highest education level: Not on file  Occupational History   Occupation: Solicitor  Tobacco Use   Smoking status: Former Smoker    Types: Cigarettes    Quit date: 06/01/1971    Years since quitting:  49.4   Smokeless tobacco: Never Used  Vaping Use   Vaping Use: Never used  Substance and Sexual Activity   Alcohol use: No   Drug use: No   Sexual activity: Not on file  Other Topics Concern   Not on file  Social History Narrative   Lives with husband, married 7 years.  She has one son.   Retired from [employer].    Highest level of education:  4 years of college   Social Determinants of Health   Financial Resource Strain:    Difficulty of Paying Living Expenses: Not on file  Food Insecurity:    Worried About Charity fundraiser in the Last Year: Not on file   [employer] of Food in the Last Year: Not on file  Transportation Needs:    Lack of Transportation (Medical): Not on file   Lack of Transportation (Non-Medical): Not on file  Physical Activity:    Days of Exercise per Week: Not on file   Minutes of Exercise per Session: Not on file  Stress:    Feeling of Stress : Not on file  Social Connections:    Frequency of Communication with Friends and Family: Not on file   Frequency of Social Gatherings with Friends and Family: Not on file   Attends Religious Services: Not on file   Active Member of Clubs or Organizations: Not on file   Attends Archivist  Meetings: Not on file   Marital Status: Not on file  Intimate Partner Violence:    Fear of Current or Ex-Partner: Not on file   Emotionally Abused: Not on file   Physically Abused: Not on file   Sexually Abused: Not on file    Family History  Problem Relation Age of Onset   Kidney disease Father        Deceased, 69   Colon cancer Mother        Deceased 7   Healthy Son    Breast cancer Neg Hx      Current Outpatient Medications:    albuterol (PROVENTIL HFA;VENTOLIN HFA) 108 (90 Base) MCG/ACT inhaler, Inhale 1 puff into the lungs every 6 (six) hours as needed for wheezing or shortness of breath. , Disp: , Rfl:    Ascorbic Acid (VITAMIN C PO), Take 1 tablet by mouth daily., Disp:  , Rfl:    baclofen (LIORESAL) 10 MG tablet, Take 10 mg by mouth 3 (three) times daily., Disp: , Rfl:    Calcium Carbonate-Vitamin D (CALCIUM 600+D PO), Take 1 tablet by mouth 2 (two) times daily. , Disp: , Rfl:    cetirizine (ZYRTEC) 10 MG tablet, Take 10 mg by mouth daily., Disp: , Rfl:    Cholecalciferol (VITAMIN D3) 5000 units CAPS, Take 5,000 Units by mouth daily., Disp: , Rfl:    EPINEPHrine (EPI-PEN) 0.3 mg/0.3 mL SOAJ injection, Inject 0.3 mg into the muscle once. , Disp: , Rfl:    fluticasone (FLONASE) 50 MCG/ACT nasal spray, Place 1 spray into both nostrils daily as needed for allergies. , Disp: , Rfl:    folic acid (FOLVITE) 1 MG tablet, Take 1 mg by mouth daily., Disp: , Rfl:    meloxicam (MOBIC) 15 MG tablet, Take 15 mg by mouth daily as needed., Disp: , Rfl:    metoprolol tartrate (LOPRESSOR) 25 MG tablet, Take 50 mg by mouth at bedtime., Disp: , Rfl:    Multiple Vitamins-Minerals (MULTIVITAMIN WITH MINERALS) tablet, Take 1 tablet by mouth daily., Disp: , Rfl:    VITAMIN E PO, Take 1 capsule by mouth daily., Disp: , Rfl:    HYDROcodone-acetaminophen (NORCO) 5-325 MG tablet, Take 1-2 tablets by mouth every 4 (four) hours as needed for moderate pain or severe pain., Disp: 10 tablet, Rfl: 0   levocetirizine (XYZAL) 5 MG tablet, Take 5 mg by mouth every evening., Disp: , Rfl:    ondansetron (ZOFRAN ODT) 4 MG disintegrating tablet, Take 1 tablet (4 mg total) by mouth every 8 (eight) hours as needed for nausea or vomiting., Disp: 20 tablet, Rfl: 0   pantoprazole (PROTONIX) 40 MG tablet, Take 40 mg by mouth daily before breakfast., Disp: , Rfl:   Physical exam:  Vitals:   10/08/20 1448  BP: 137/72  Pulse: 74  Resp: 20  Temp: 98 F (36.7 C)  SpO2: 99%  Weight: 173 lb 4.8 oz (78.6 kg)   Physical Exam Constitutional:      General: She is not in acute distress. Eyes:     Pupils: Pupils are equal, round, and reactive to light.  Pulmonary:     Effort: Pulmonary effort  is normal.  Skin:    General: Skin is warm and dry.  Neurological:     Mental Status: She is alert and oriented to person, place, and time.      CMP Latest Ref Rng & Units 09/20/2018  Glucose 70 - 99 mg/dL 83  BUN 8 - 23 mg/dL 10  Creatinine 0.44 - 1.00 mg/dL 0.66  Sodium 135 - 145 mmol/L 141  Potassium 3.5 - 5.1 mmol/L 3.8  Chloride 98 - 111 mmol/L 105  CO2 22 - 32 mmol/L 30  Calcium 8.9 - 10.3 mg/dL 9.7  Total Protein 6.5 - 8.1 g/dL -  Total Bilirubin 0.3 - 1.2 mg/dL -  Alkaline Phos 38 - 126 U/L -  AST 15 - 41 U/L -  ALT 14 - 54 U/L -   CBC Latest Ref Rng & Units 10/02/2020  WBC 4.0 - 10.5 K/uL 7.0  Hemoglobin 12.0 - 15.0 g/dL 14.0  Hematocrit 36 - 46 % 40.2  Platelets 150 - 400 K/uL 117(L)      Assessment and plan- Patient is a 69 y.o. 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  This is a visit to discuss the results of blood work.    CBC showed mild isolated thrombocytopenia with a platelet count of 117.  B12 folate and smear review was unremarkable.  This is possibly secondary to ITP and I discussed with the patient what ITP is and how it is treated.  At this level of platelet count patient does not require any bone marrow biopsy or treatment for her thrombocytopenia either.  I will see her back in 6 months with a CBC with   Visit Diagnosis 1. Thrombocytopenia ([place])      Dr. Randa Evens, MD, MPH [facility] [phone] 10/12/2020 6:53 PM

## 2020-12-19 DIAGNOSIS — R0981 Nasal congestion: Secondary | ICD-10-CM | POA: Diagnosis not present

## 2020-12-19 DIAGNOSIS — J029 Acute pharyngitis, unspecified: Secondary | ICD-10-CM | POA: Diagnosis not present

## 2020-12-19 DIAGNOSIS — J3489 Other specified disorders of nose and nasal sinuses: Secondary | ICD-10-CM | POA: Diagnosis not present

## 2021-01-21 DIAGNOSIS — Z23 Encounter for immunization: Secondary | ICD-10-CM | POA: Diagnosis not present

## 2021-01-21 DIAGNOSIS — Z8679 Personal history of other diseases of the circulatory system: Secondary | ICD-10-CM | POA: Diagnosis not present

## 2021-01-21 DIAGNOSIS — H16223 Keratoconjunctivitis sicca, not specified as Sjogren's, bilateral: Secondary | ICD-10-CM | POA: Diagnosis not present

## 2021-02-14 DIAGNOSIS — H02834 Dermatochalasis of left upper eyelid: Secondary | ICD-10-CM | POA: Diagnosis not present

## 2021-02-14 DIAGNOSIS — H02831 Dermatochalasis of right upper eyelid: Secondary | ICD-10-CM | POA: Diagnosis not present

## 2021-03-18 DIAGNOSIS — R8290 Unspecified abnormal findings in urine: Secondary | ICD-10-CM | POA: Diagnosis not present

## 2021-04-01 DIAGNOSIS — Z8679 Personal history of other diseases of the circulatory system: Secondary | ICD-10-CM | POA: Diagnosis not present

## 2021-04-01 DIAGNOSIS — Z131 Encounter for screening for diabetes mellitus: Secondary | ICD-10-CM | POA: Diagnosis not present

## 2021-04-01 DIAGNOSIS — Z0000 Encounter for general adult medical examination without abnormal findings: Secondary | ICD-10-CM | POA: Diagnosis not present

## 2021-04-01 DIAGNOSIS — Z136 Encounter for screening for cardiovascular disorders: Secondary | ICD-10-CM | POA: Diagnosis not present

## 2021-04-01 DIAGNOSIS — D696 Thrombocytopenia, unspecified: Secondary | ICD-10-CM | POA: Diagnosis not present

## 2021-04-05 DIAGNOSIS — Z79899 Other long term (current) drug therapy: Secondary | ICD-10-CM | POA: Diagnosis not present

## 2021-04-05 DIAGNOSIS — K219 Gastro-esophageal reflux disease without esophagitis: Secondary | ICD-10-CM | POA: Diagnosis not present

## 2021-04-05 DIAGNOSIS — H02831 Dermatochalasis of right upper eyelid: Secondary | ICD-10-CM | POA: Diagnosis not present

## 2021-04-05 DIAGNOSIS — H57813 Brow ptosis, bilateral: Secondary | ICD-10-CM | POA: Diagnosis not present

## 2021-04-05 DIAGNOSIS — H02834 Dermatochalasis of left upper eyelid: Secondary | ICD-10-CM | POA: Diagnosis not present

## 2021-04-07 ENCOUNTER — Other Ambulatory Visit: Payer: Self-pay | Admitting: *Deleted

## 2021-04-07 DIAGNOSIS — D696 Thrombocytopenia, unspecified: Secondary | ICD-10-CM

## 2021-04-11 ENCOUNTER — Inpatient Hospital Stay: Payer: Medicare HMO | Admitting: Oncology

## 2021-04-11 ENCOUNTER — Inpatient Hospital Stay: Payer: Medicare HMO | Attending: Oncology

## 2021-04-11 ENCOUNTER — Encounter: Payer: Self-pay | Admitting: Oncology

## 2021-04-11 VITALS — BP 136/77 | HR 73 | Temp 97.6°F | Resp 18 | Wt 166.0 lb

## 2021-04-11 DIAGNOSIS — D126 Benign neoplasm of colon, unspecified: Secondary | ICD-10-CM | POA: Diagnosis not present

## 2021-04-11 DIAGNOSIS — D696 Thrombocytopenia, unspecified: Secondary | ICD-10-CM

## 2021-04-11 DIAGNOSIS — Z800 Family history of malignant neoplasm of digestive organs: Secondary | ICD-10-CM | POA: Diagnosis not present

## 2021-04-11 DIAGNOSIS — I471 Supraventricular tachycardia: Secondary | ICD-10-CM | POA: Insufficient documentation

## 2021-04-11 DIAGNOSIS — K219 Gastro-esophageal reflux disease without esophagitis: Secondary | ICD-10-CM | POA: Diagnosis not present

## 2021-04-11 DIAGNOSIS — M545 Low back pain, unspecified: Secondary | ICD-10-CM | POA: Diagnosis not present

## 2021-04-11 DIAGNOSIS — R748 Abnormal levels of other serum enzymes: Secondary | ICD-10-CM | POA: Insufficient documentation

## 2021-04-11 DIAGNOSIS — I10 Essential (primary) hypertension: Secondary | ICD-10-CM | POA: Diagnosis not present

## 2021-04-11 DIAGNOSIS — Z79899 Other long term (current) drug therapy: Secondary | ICD-10-CM | POA: Diagnosis not present

## 2021-04-11 DIAGNOSIS — Z87891 Personal history of nicotine dependence: Secondary | ICD-10-CM | POA: Insufficient documentation

## 2021-04-11 LAB — CBC WITH DIFFERENTIAL/PLATELET
Abs Immature Granulocytes: 0.01 10*3/uL (ref 0.00–0.07)
Basophils Absolute: 0.1 10*3/uL (ref 0.0–0.1)
Basophils Relative: 1 %
Eosinophils Absolute: 0.3 10*3/uL (ref 0.0–0.5)
Eosinophils Relative: 3 %
HCT: 40.3 % (ref 36.0–46.0)
Hemoglobin: 14 g/dL (ref 12.0–15.0)
Immature Granulocytes: 0 %
Lymphocytes Relative: 30 %
Lymphs Abs: 2.3 10*3/uL (ref 0.7–4.0)
MCH: 31.9 pg (ref 26.0–34.0)
MCHC: 34.7 g/dL (ref 30.0–36.0)
MCV: 91.8 fL (ref 80.0–100.0)
Monocytes Absolute: 0.6 10*3/uL (ref 0.1–1.0)
Monocytes Relative: 8 %
Neutro Abs: 4.6 10*3/uL (ref 1.7–7.7)
Neutrophils Relative %: 58 %
Platelets: 110 10*3/uL — ABNORMAL LOW (ref 150–400)
RBC: 4.39 MIL/uL (ref 3.87–5.11)
RDW: 12.4 % (ref 11.5–15.5)
WBC: 7.9 10*3/uL (ref 4.0–10.5)
nRBC: 0 % (ref 0.0–0.2)

## 2021-04-12 NOTE — Progress Notes (Signed)
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 Most recent CBC from 09/17/2020 showed white count of 6.4, H&H of 14.4/42.2 and a platelet count of 113. Looking back at her prior platelet counts in 2018 her platelet count was 147 and then 91 in April 2020 and 129 in October 2020. She has had a normal white count and hemoglobin consistently. Hepatitis C testing was negative. CMP was normal except for mildly elevated alkaline phosphatase of 147. Patient is doing well and denies any changes in her appetite or easy bruising.   Blood work from 10/02/2020 were as follows: CBC showed white count of 7, H&H of 14/40.2 and a platelet count of 117.  B12 folate and LDH were normal.  Smear review was unremarkable.  Interval history-patient reports doing well and denies any specific complaints at this time.  Denies any bleeding or bruising.  She recently underwent bilateral blepharoplasties  ECOG PS- 1 Pain scale- 0   Review of systems- Review of Systems  Constitutional: Negative for chills, fever, malaise/fatigue and weight loss.  HENT: Negative for congestion, ear discharge and nosebleeds.   Eyes: Negative for blurred vision.  Respiratory: Negative for cough, hemoptysis, sputum production, shortness of breath and wheezing.   Cardiovascular: Negative for chest pain, palpitations, orthopnea and claudication.   Gastrointestinal: Negative for abdominal pain, blood in stool, constipation, diarrhea, heartburn, melena, nausea and vomiting.  Genitourinary: Negative for dysuria, flank pain, frequency, hematuria and urgency.  Musculoskeletal: Negative for back pain, joint pain and myalgias.  Skin: Negative for rash.  Neurological: Negative for dizziness, tingling, focal weakness, seizures, weakness and headaches.  Endo/Heme/Allergies: Does not bruise/bleed easily.  Psychiatric/Behavioral: Negative for depression and suicidal ideas. The patient does not have insomnia.       Allergies  Allergen Reactions  . Pork-Derived Products Anaphylaxis  . Penicillin V Potassium Hives, Rash and Other (See Comments)    Has patient had a PCN reaction causing immediate rash, facial/tongue/throat swelling, SOB or lightheadedness with hypotension: Yes Has patient had a PCN reaction causing severe rash involving mucus membranes or skin necrosis: Yes Has patient had a PCN reaction that required hospitalization: No Has patient had a PCN reaction occurring within the last 10 years: No If all of the above answers are "NO", then may proceed with Cephalosporin use.   . Bee Venom Swelling  . Shellfish Allergy Hives and Swelling     Past Medical History:  Diagnosis Date  . Allergic genetic state   . Asthma   . Dysrhythmia   . GERD (gastroesophageal reflux disease)   . Hypertension   . Lumbago   . Paroxysmal atrial tachycardia ([place])   . Tubular adenoma of colon      Past Surgical History:  Procedure Laterality Date  . ABDOMINAL HYSTERECTOMY  1979  . CARPAL TUNNEL RELEASE Left 1997  . CATARACT EXTRACTION W/PHACO Right 04/15/2017   Procedure: CATARACT EXTRACTION PHACO AND INTRAOCULAR LENS PLACEMENT ([facility]);  Surgeon: Estill Cotta, MD;  Location: [facility] ORS;  Service: Ophthalmology;  Laterality:  Right;  Lot# [id number] H [place]: 01:02.8 AP%: 19.7 CDE: 25.43  . CATARACT EXTRACTION W/PHACO Left 05/13/2017   Procedure:  CATARACT EXTRACTION PHACO AND INTRAOCULAR LENS PLACEMENT ([facility]);  Surgeon: Estill Cotta, MD;  Location: [facility] ORS;  Service: Ophthalmology;  Laterality: Left;  [place] 1:07.6 AP% 22.9 CDE 31.29 Fluid Pack lot # [id number]  . COLONOSCOPY WITH PROPOFOL N/A 10/09/2017   Procedure: COLONOSCOPY WITH PROPOFOL;  Surgeon: Lollie Sails, MD;  Location: [facility] END[facility]COPY;  Service: Endoscopy;  Laterality: N/A;  . ESOPHAGOGASTRODUODENOSCOPY (EGD) WITH PROPOFOL N/A 10/09/2017   Procedure: ESOPHAGOGASTRODUODENOSCOPY (EGD) WITH PROPOFOL;  Surgeon: Lollie Sails, MD;  Location[facility] END[facility];  Service: Endoscopy;  Laterality: N/A;  . ESOPHAGOGASTRODUODENOSCOPY (EGD) WITH PROPOFOL N/A 12/22/2017   Procedure: ESOPHAGOGASTRODUODENOSCOPY (EGD) WITH PROPOFOL;  Surgeon: Lollie Sails, MD;  Location[facility] ENDOSCOPY;  Service: Endoscopy;  Laterality: N/A;  . ESOPHAGOGASTRODUODENOSCOPY (EGD) WITH PROPOFOL N/A 05/23/2020   Procedure: ESOPHAGOGASTRODUODENOSCOPY (EGD) WITH PROPOFOL;  Surgeon: Toledo, Benay Pike, MD;  Location: [facility] ENDOSCOPY;  Service: Gastroenterology;  Laterality: N/A;  . EYE SURGERY    . KNEE ARTHROSCOPY WITH MEDIAL MENISECTOMY Right 08/19/2019   Procedure: KNEE ARTHROSCOPY WITH PARTIAL MEDIAL MENISECTOMY,  PLICA EXCISION;  Surgeon: Leim Fabry, MD;  Location: [place];  Service: Orthopedics;  Laterality: Right;  . SHOULDER ARTHROSCOPY WITH OPEN ROTATOR CUFF REPAIR Left 09/24/2018   Procedure: SHOULDER ARTHROSCOPY WITH OPEN ROTATOR CUFF REPAIR;  Surgeon: Leim Fabry, MD;  Location: [facility] ORS;  Service: Orthopedics;  Laterality: Left;  . TMJ ARTHROPLASTY    . TONSILLECTOMY AND ADENOIDECTOMY      Social History   Socioeconomic History  . Marital status: Married    Spouse name: Not on file  . Number of children: Not on file  . Years of education: Not on file  . Highest education level: Not on file  Occupational History  . Occupation: Solicitor  Tobacco Use  . Smoking  status: Former Smoker    Types: Cigarettes    Quit date: 06/01/1971    Years since quitting: 49.8  . Smokeless tobacco: Never Used  Vaping Use  . Vaping Use: Never used  Substance and Sexual Activity  . Alcohol use: No  . Drug use: No  . Sexual activity: Not on file  Other Topics Concern  . Not on file  Social History Narrative   Lives with husband, married 7 years.  She has one son.   Retired from [employer].    Highest level of education:  4 years of college   Social Determinants of Health   Financial Resource Strain: Not on file  Food Insecurity: Not on file  Transportation Needs: Not on file  Physical Activity: Not on file  Stress: Not on file  Social Connections: Not on file  Intimate Partner Violence: Not on file    Family History  Problem Relation Age of Onset  . Kidney disease Father        Deceased, 36  . Colon cancer Mother        Deceased 85  . Healthy Son   . Breast cancer Neg Hx      Current Outpatient Medications:  .  albuterol (PROVENTIL HFA;VENTOLIN HFA) 108 (90 Base) MCG/ACT inhaler, Inhale 1 puff into the lungs every 6 (six) hours as needed for wheezing or shortness of breath. , Disp: , Rfl:  .  Ascorbic Acid (VITAMIN C PO), Take 1 tablet by mouth daily., Disp: , Rfl:  .  baclofen (LIORESAL) 10 MG tablet,  Take 10 mg by mouth 3 (three) times daily., Disp: , Rfl:  .  Calcium Carbonate-Vitamin D (CALCIUM 600+D PO), Take 1 tablet by mouth 2 (two) times daily. , Disp: , Rfl:  .  cetirizine (ZYRTEC) 10 MG tablet, Take 10 mg by mouth daily., Disp: , Rfl:  .  Cholecalciferol (VITAMIN D3) 5000 units CAPS, Take 5,000 Units by mouth daily., Disp: , Rfl:  .  EPINEPHrine (EPI-PEN) 0.3 mg/0.3 mL SOAJ injection, Inject 0.3 mg into the muscle once., Disp: , Rfl:  .  fluticasone (FLONASE) 50 MCG/ACT nasal spray, Place 1 spray into both nostrils daily as needed for allergies. , Disp: , Rfl:  .  folic acid (FOLVITE) 1 MG tablet, Take 1 mg by mouth daily., Disp: , Rfl:   .  meloxicam (MOBIC) 15 MG tablet, Take 15 mg by mouth daily as needed., Disp: , Rfl:  .  metoprolol tartrate (LOPRESSOR) 25 MG tablet, Take 50 mg by mouth at bedtime., Disp: , Rfl:  .  Multiple Vitamins-Minerals (MULTIVITAMIN WITH MINERALS) tablet, Take 1 tablet by mouth daily., Disp: , Rfl:  .  VITAMIN E PO, Take 1 capsule by mouth daily., Disp: , Rfl:   Physical exam:  Vitals:   04/11/21 1430  BP: 136/77  Pulse: 73  Resp: 18  Temp: 97.6 F (36.4 C)  TempSrc: Tympanic  SpO2: 100%  Weight: 166 lb (75.3 kg)   Physical Exam Constitutional:      General: She is not in acute distress. Eyes:     Comments: Surgical scar of recent bilateral blepharoplasties as well as skin incisions over forehead healing well.  Cardiovascular:     Rate and Rhythm: Normal rate and regular rhythm.     Heart sounds: Normal heart sounds.  Pulmonary:     Effort: Pulmonary effort is normal.     Breath sounds: Normal breath sounds.  Abdominal:     General: Bowel sounds are normal.     Palpations: Abdomen is soft.  Skin:    General: Skin is warm and dry.  Neurological:     Mental Status: She is alert and oriented to person, place, and time.      CMP Latest Ref Rng & Units 09/20/2018  Glucose 70 - 99 mg/dL 83  BUN 8 - 23 mg/dL 10  Creatinine 0.44 - 1.00 mg/dL 0.66  Sodium 135 - 145 mmol/L 141  Potassium 3.5 - 5.1 mmol/L 3.8  Chloride 98 - 111 mmol/L 105  CO2 22 - 32 mmol/L 30  Calcium 8.9 - 10.3 mg/dL 9.7  Total Protein 6.5 - 8.1 g/dL -  Total Bilirubin 0.3 - 1.2 mg/dL -  Alkaline Phos 38 - 126 U/L -  AST 15 - 41 U/L -  ALT 14 - 54 U/L -   CBC Latest Ref Rng & Units 04/11/2021  WBC 4.0 - 10.5 K/uL 7.9  Hemoglobin 12.0 - 15.0 g/dL 14.0  Hematocrit 36.0 - 46.0 % 40.3  Platelets 150 - 400 K/uL 110(L)      Assessment and plan- Patient is a 70 y.o. 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  No other cytopenias.  Given the stability of her counts I will plan to see her back in 1 year with an interim CBC with differential to be checked in 6 months    Visit Diagnosis 1. Thrombocytopenia ([place])  Dr. Randa Evens, MD, MPH [facility] at [facility] [phone] 04/12/2021 12:28 PM

## 2021-06-26 ENCOUNTER — Other Ambulatory Visit: Payer: Self-pay | Admitting: Family Medicine

## 2021-06-26 DIAGNOSIS — M5416 Radiculopathy, lumbar region: Secondary | ICD-10-CM | POA: Diagnosis not present

## 2021-06-26 DIAGNOSIS — I480 Paroxysmal atrial fibrillation: Secondary | ICD-10-CM | POA: Diagnosis not present

## 2021-06-26 DIAGNOSIS — Z8679 Personal history of other diseases of the circulatory system: Secondary | ICD-10-CM | POA: Diagnosis not present

## 2021-06-26 DIAGNOSIS — D696 Thrombocytopenia, unspecified: Secondary | ICD-10-CM | POA: Diagnosis not present

## 2021-07-06 ENCOUNTER — Ambulatory Visit
Admission: RE | Admit: 2021-07-06 | Discharge: 2021-07-06 | Disposition: A | Discharge status: Home / Self Care | Payer: Medicare HMO | Source: Ambulatory Visit | Attending: Family Medicine | Admitting: Family Medicine

## 2021-07-06 ENCOUNTER — Other Ambulatory Visit: Payer: Self-pay

## 2021-07-06 DIAGNOSIS — M48061 Spinal stenosis, lumbar region without neurogenic claudication: Secondary | ICD-10-CM | POA: Diagnosis not present

## 2021-07-06 DIAGNOSIS — M5116 Intervertebral disc disorders with radiculopathy, lumbar region: Secondary | ICD-10-CM | POA: Diagnosis not present

## 2021-07-06 DIAGNOSIS — M5416 Radiculopathy, lumbar region: Secondary | ICD-10-CM | POA: Diagnosis not present

## 2021-07-06 DIAGNOSIS — M4726 Other spondylosis with radiculopathy, lumbar region: Secondary | ICD-10-CM | POA: Diagnosis not present

## 2021-07-06 DIAGNOSIS — M5117 Intervertebral disc disorders with radiculopathy, lumbosacral region: Secondary | ICD-10-CM | POA: Diagnosis not present

## 2021-07-12 ENCOUNTER — Other Ambulatory Visit: Payer: Self-pay | Admitting: Family Medicine

## 2021-07-12 DIAGNOSIS — R9389 Abnormal findings on diagnostic imaging of other specified body structures: Secondary | ICD-10-CM

## 2021-07-12 DIAGNOSIS — M5416 Radiculopathy, lumbar region: Secondary | ICD-10-CM | POA: Diagnosis not present

## 2021-07-12 DIAGNOSIS — M5136 Other intervertebral disc degeneration, lumbar region: Secondary | ICD-10-CM | POA: Diagnosis not present

## 2021-07-20 ENCOUNTER — Ambulatory Visit
Admission: RE | Admit: 2021-07-20 | Discharge: 2021-07-20 | Disposition: A | Discharge status: Home / Self Care | Payer: Medicare HMO | Source: Ambulatory Visit | Attending: Family Medicine | Admitting: Family Medicine

## 2021-07-20 ENCOUNTER — Other Ambulatory Visit: Payer: Self-pay

## 2021-07-20 DIAGNOSIS — M47814 Spondylosis without myelopathy or radiculopathy, thoracic region: Secondary | ICD-10-CM | POA: Diagnosis not present

## 2021-07-20 DIAGNOSIS — R9389 Abnormal findings on diagnostic imaging of other specified body structures: Secondary | ICD-10-CM

## 2021-07-20 DIAGNOSIS — M4314 Spondylolisthesis, thoracic region: Secondary | ICD-10-CM | POA: Diagnosis not present

## 2021-07-20 DIAGNOSIS — D1809 Hemangioma of other sites: Secondary | ICD-10-CM | POA: Diagnosis not present

## 2021-07-20 DIAGNOSIS — M5124 Other intervertebral disc displacement, thoracic region: Secondary | ICD-10-CM | POA: Diagnosis not present

## 2021-08-05 DIAGNOSIS — Z8679 Personal history of other diseases of the circulatory system: Secondary | ICD-10-CM | POA: Diagnosis not present

## 2021-08-05 DIAGNOSIS — R079 Chest pain, unspecified: Secondary | ICD-10-CM | POA: Diagnosis not present

## 2021-08-08 DIAGNOSIS — G950 Syringomyelia and syringobulbia: Secondary | ICD-10-CM | POA: Diagnosis not present

## 2021-08-08 DIAGNOSIS — M545 Low back pain, unspecified: Secondary | ICD-10-CM | POA: Diagnosis not present

## 2021-08-08 DIAGNOSIS — G8929 Other chronic pain: Secondary | ICD-10-CM | POA: Diagnosis not present

## 2021-08-23 DIAGNOSIS — D75839 Thrombocytosis, unspecified: Secondary | ICD-10-CM | POA: Diagnosis not present

## 2021-08-23 DIAGNOSIS — M47816 Spondylosis without myelopathy or radiculopathy, lumbar region: Secondary | ICD-10-CM | POA: Diagnosis not present

## 2021-08-23 DIAGNOSIS — M5136 Other intervertebral disc degeneration, lumbar region: Secondary | ICD-10-CM | POA: Diagnosis not present

## 2021-08-23 DIAGNOSIS — M5416 Radiculopathy, lumbar region: Secondary | ICD-10-CM | POA: Diagnosis not present

## 2021-09-02 DIAGNOSIS — M6281 Muscle weakness (generalized): Secondary | ICD-10-CM | POA: Diagnosis not present

## 2021-09-02 DIAGNOSIS — M5410 Radiculopathy, site unspecified: Secondary | ICD-10-CM | POA: Diagnosis not present

## 2021-09-04 DIAGNOSIS — R079 Chest pain, unspecified: Secondary | ICD-10-CM | POA: Diagnosis not present

## 2021-09-05 DIAGNOSIS — D696 Thrombocytopenia, unspecified: Secondary | ICD-10-CM | POA: Diagnosis not present

## 2021-09-06 DIAGNOSIS — M47816 Spondylosis without myelopathy or radiculopathy, lumbar region: Secondary | ICD-10-CM | POA: Diagnosis not present

## 2021-09-13 DIAGNOSIS — M5410 Radiculopathy, site unspecified: Secondary | ICD-10-CM | POA: Diagnosis not present

## 2021-09-18 DIAGNOSIS — M5410 Radiculopathy, site unspecified: Secondary | ICD-10-CM | POA: Diagnosis not present

## 2021-09-20 DIAGNOSIS — R079 Chest pain, unspecified: Secondary | ICD-10-CM | POA: Diagnosis not present

## 2021-09-20 DIAGNOSIS — Z23 Encounter for immunization: Secondary | ICD-10-CM | POA: Diagnosis not present

## 2021-09-20 DIAGNOSIS — Z8679 Personal history of other diseases of the circulatory system: Secondary | ICD-10-CM | POA: Diagnosis not present

## 2021-09-25 DIAGNOSIS — D696 Thrombocytopenia, unspecified: Secondary | ICD-10-CM | POA: Diagnosis not present

## 2021-09-25 DIAGNOSIS — M47816 Spondylosis without myelopathy or radiculopathy, lumbar region: Secondary | ICD-10-CM | POA: Diagnosis not present

## 2021-09-26 DIAGNOSIS — J0140 Acute pansinusitis, unspecified: Secondary | ICD-10-CM | POA: Diagnosis not present

## 2021-09-26 DIAGNOSIS — U071 COVID-19: Secondary | ICD-10-CM | POA: Diagnosis not present

## 2021-09-26 DIAGNOSIS — Z03818 Encounter for observation for suspected exposure to other biological agents ruled out: Secondary | ICD-10-CM | POA: Diagnosis not present

## 2021-10-02 DIAGNOSIS — J019 Acute sinusitis, unspecified: Secondary | ICD-10-CM | POA: Diagnosis not present

## 2021-10-09 ENCOUNTER — Encounter: Payer: Self-pay | Admitting: Oncology

## 2021-10-09 NOTE — Telephone Encounter (Signed)
She comes for labs on 11/4. Based on that we can give her a letter that she can get it done

## 2021-10-11 ENCOUNTER — Other Ambulatory Visit: Payer: Self-pay

## 2021-10-11 ENCOUNTER — Inpatient Hospital Stay: Payer: Medicare HMO | Attending: Oncology

## 2021-10-11 DIAGNOSIS — D696 Thrombocytopenia, unspecified: Secondary | ICD-10-CM | POA: Insufficient documentation

## 2021-10-11 LAB — CBC WITH DIFFERENTIAL/PLATELET
Abs Immature Granulocytes: 0.02 10*3/uL (ref 0.00–0.07)
Basophils Absolute: 0 10*3/uL (ref 0.0–0.1)
Basophils Relative: 0 %
Eosinophils Absolute: 0.2 10*3/uL (ref 0.0–0.5)
Eosinophils Relative: 2 %
HCT: 38.8 % (ref 36.0–46.0)
Hemoglobin: 13.4 g/dL (ref 12.0–15.0)
Immature Granulocytes: 0 %
Lymphocytes Relative: 32 %
Lymphs Abs: 2.5 10*3/uL (ref 0.7–4.0)
MCH: 31.8 pg (ref 26.0–34.0)
MCHC: 34.5 g/dL (ref 30.0–36.0)
MCV: 92.2 fL (ref 80.0–100.0)
Monocytes Absolute: 0.6 10*3/uL (ref 0.1–1.0)
Monocytes Relative: 7 %
Neutro Abs: 4.5 10*3/uL (ref 1.7–7.7)
Neutrophils Relative %: 59 %
Platelets: 101 10*3/uL — ABNORMAL LOW (ref 150–400)
RBC: 4.21 MIL/uL (ref 3.87–5.11)
RDW: 12.6 % (ref 11.5–15.5)
WBC: 7.8 10*3/uL (ref 4.0–10.5)
nRBC: 0 % (ref 0.0–0.2)

## 2021-10-15 ENCOUNTER — Other Ambulatory Visit: Payer: Self-pay | Admitting: *Deleted

## 2021-10-15 ENCOUNTER — Telehealth: Payer: Self-pay | Admitting: *Deleted

## 2021-10-15 NOTE — Telephone Encounter (Signed)
Got a fax and phone call from physiatry. I called and spoke to someone about this pt and she was explaining that the phones are not working right and then got disconnected, tried to call several more times and was only able to leave a message that pt can get the procedure 11/10. Trying to fax it but not able to go through right now. I have called back and spoke  Carla B.  and she took my message that it is ok to get procedure. She wants me to try again tom. To fax the letter again

## 2021-10-16 ENCOUNTER — Telehealth: Payer: Self-pay

## 2021-10-16 NOTE — Telephone Encounter (Signed)
Have faxed over pts clearance for a lumbar RFA to [place] at [facility] at [phone]. Recieved fax confirmation.

## 2021-10-17 DIAGNOSIS — D696 Thrombocytopenia, unspecified: Secondary | ICD-10-CM | POA: Diagnosis not present

## 2021-10-18 DIAGNOSIS — M47816 Spondylosis without myelopathy or radiculopathy, lumbar region: Secondary | ICD-10-CM | POA: Diagnosis not present

## 2021-10-30 DIAGNOSIS — J40 Bronchitis, not specified as acute or chronic: Secondary | ICD-10-CM | POA: Diagnosis not present

## 2021-10-30 DIAGNOSIS — Z8679 Personal history of other diseases of the circulatory system: Secondary | ICD-10-CM | POA: Diagnosis not present

## 2021-10-30 DIAGNOSIS — Z03818 Encounter for observation for suspected exposure to other biological agents ruled out: Secondary | ICD-10-CM | POA: Diagnosis not present

## 2021-11-27 DIAGNOSIS — M5136 Other intervertebral disc degeneration, lumbar region: Secondary | ICD-10-CM | POA: Diagnosis not present

## 2021-11-27 DIAGNOSIS — M5416 Radiculopathy, lumbar region: Secondary | ICD-10-CM | POA: Diagnosis not present

## 2021-11-27 DIAGNOSIS — M47816 Spondylosis without myelopathy or radiculopathy, lumbar region: Secondary | ICD-10-CM | POA: Diagnosis not present

## 2021-12-10 DIAGNOSIS — L03031 Cellulitis of right toe: Secondary | ICD-10-CM | POA: Diagnosis not present

## 2021-12-10 DIAGNOSIS — L03032 Cellulitis of left toe: Secondary | ICD-10-CM | POA: Diagnosis not present

## 2021-12-23 DIAGNOSIS — L03032 Cellulitis of left toe: Secondary | ICD-10-CM | POA: Diagnosis not present

## 2021-12-23 DIAGNOSIS — L03031 Cellulitis of right toe: Secondary | ICD-10-CM | POA: Diagnosis not present

## 2021-12-27 DIAGNOSIS — D696 Thrombocytopenia, unspecified: Secondary | ICD-10-CM | POA: Diagnosis not present

## 2021-12-27 DIAGNOSIS — Z1389 Encounter for screening for other disorder: Secondary | ICD-10-CM | POA: Diagnosis not present

## 2021-12-27 DIAGNOSIS — Z0000 Encounter for general adult medical examination without abnormal findings: Secondary | ICD-10-CM | POA: Diagnosis not present

## 2022-01-22 ENCOUNTER — Other Ambulatory Visit: Payer: Self-pay | Admitting: Family Medicine

## 2022-01-22 DIAGNOSIS — Z961 Presence of intraocular lens: Secondary | ICD-10-CM | POA: Diagnosis not present

## 2022-01-22 DIAGNOSIS — Z1231 Encounter for screening mammogram for malignant neoplasm of breast: Secondary | ICD-10-CM

## 2022-01-25 ENCOUNTER — Ambulatory Visit
Admission: RE | Admit: 2022-01-25 | Discharge: 2022-01-25 | Disposition: A | Discharge status: Home / Self Care | Payer: Medicare HMO | Source: Ambulatory Visit | Attending: Internal Medicine | Admitting: Internal Medicine

## 2022-01-25 ENCOUNTER — Other Ambulatory Visit: Payer: Self-pay

## 2022-01-25 VITALS — BP 148/71 | HR 92 | Temp 98.6°F | Resp 18 | Ht 66.0 in | Wt 158.0 lb

## 2022-01-25 DIAGNOSIS — H6121 Impacted cerumen, right ear: Secondary | ICD-10-CM | POA: Diagnosis not present

## 2022-01-25 DIAGNOSIS — J309 Allergic rhinitis, unspecified: Secondary | ICD-10-CM

## 2022-01-25 MED ORDER — FEXOFENADINE HCL 180 MG PO TABS: 180.0000 mg | ORAL_TABLET | Freq: Every day | ORAL | 0 refills | Status: DC

## 2022-01-25 MED ORDER — PREDNISONE 20 MG PO TABS: 20.0000 mg | ORAL_TABLET | Freq: Every day | ORAL | 0 refills | Status: DC

## 2022-01-25 NOTE — Discharge Instructions (Addendum)
Do saline rinses twice a day to flush allergens. Avoid saline rinses before bed time, do it about 3 hours before bed time and in the am.  Hold off from your current allergy medication and try the Allegra for a few weeks, then you may go back to it.

## 2022-01-25 NOTE — ED Provider Notes (Addendum)
[facility]    CSN: [id number] Arrival date & time: 01/25/22  4098      History   Chief Complaint Chief Complaint  Patient presents with   Nasal Congestion    Appt @ 9    HPI Eliese C Dowse is a 71 y.o. female who started having ear pain behind her ears, then since then her eyes have been watering, and having a lot rhinitis and sneezing a lot. Her throat has been hurting for the past week and ice cream sooths it. Has felt feverish with highest temp of 98 Cough is mild from PND.  She has taken Mucinex, xyzal which she is on year long. She has tree allergies and has a couple of them are blooming.     Past Medical History:  Diagnosis Date   Allergic genetic state    Asthma    Dysrhythmia    GERD (gastroesophageal reflux disease)    Hypertension    Lumbago    Paroxysmal atrial tachycardia (HCC)    Tubular adenoma of colon     Patient Active Problem List   Diagnosis Date Noted   Left arm pain 01/20/2014    Past Surgical History:  Procedure Laterality Date   ABDOMINAL HYSTERECTOMY  1979   CARPAL TUNNEL RELEASE Left 1997   CATARACT EXTRACTION W/PHACO Right 04/15/2017   Procedure: CATARACT EXTRACTION PHACO AND INTRAOCULAR LENS PLACEMENT ([place]);  Surgeon: Estill Cotta, MD;  Location: [facility] ORS;  Service: Ophthalmology;  Laterality: Right;  Lot# [id number] [place]: 01:02.8 AP%: 19.7 CDE: 25.43   CATARACT EXTRACTION W/PHACO Left 05/13/2017   Procedure: CATARACT EXTRACTION PHACO AND INTRAOCULAR LENS PLACEMENT (IOC);  Surgeon: Estill Cotta, MD;  Location: [facility] ORS;  Service: Ophthalmology;  Laterality: Left;  [place] 1:07.6 AP% 22.9 CDE 31.29 Fluid Pack lot # [id number]   COLONOSCOPY WITH PROPOFOL N/A 10/09/2017   Procedure: COLONOSCOPY WITH PROPOFOL;  Surgeon: Lollie Sails, MD;  Location: [facility] ENDOSCOPY;  Service: Endoscopy;  Laterality: N/A;   ESOPHAGOGASTRODUODENOSCOPY (EGD) WITH PROPOFOL N/A 10/09/2017   Procedure: ESOPHAGOGASTRODUODENOSCOPY (EGD) WITH  PROPOFOL;  Surgeon: Lollie Sails, MD;  Location: [facility] ENDOSCOPY;  Service: Endoscopy;  Laterality: N/A;   ESOPHAGOGASTRODUODENOSCOPY (EGD) WITH PROPOFOL N/A 12/22/2017   Procedure: ESOPHAGOGASTRODUODENOSCOPY (EGD) WITH PROPOFOL;  Surgeon: Lollie Sails, MD;  Location: [facility] ENDOSCOPY;  Service: Endoscopy;  Laterality: N/A;   ESOPHAGOGASTRODUODENOSCOPY (EGD) WITH PROPOFOL N/A 05/23/2020   Procedure: ESOPHAGOGASTRODUODENOSCOPY (EGD) WITH PROPOFOL;  Surgeon: Toledo, Benay Pike, MD;  Location: [facility] ENDOSCOPY;  Service: Gastroenterology;  Laterality: N/A;   EYE SURGERY     KNEE ARTHROSCOPY WITH MEDIAL MENISECTOMY Right 08/19/2019   Procedure: KNEE ARTHROSCOPY WITH PARTIAL MEDIAL MENISECTOMY,  PLICA EXCISION;  Surgeon: Leim Fabry, MD;  Location: [place];  Service: Orthopedics;  Laterality: Right;   SHOULDER ARTHROSCOPY WITH OPEN ROTATOR CUFF REPAIR Left 09/24/2018   Procedure: SHOULDER ARTHROSCOPY WITH OPEN ROTATOR CUFF REPAIR;  Surgeon: Leim Fabry, MD;  Location: [facility] ORS;  Service: Orthopedics;  Laterality: Left;   TMJ ARTHROPLASTY     TONSILLECTOMY AND ADENOIDECTOMY      OB History   No obstetric history on file.      Home Medications    Prior to Admission medications   Medication Sig Start Date End Date Taking? Authorizing Provider  albuterol (PROVENTIL HFA;VENTOLIN HFA) 108 (90 Base) MCG/ACT inhaler Inhale 1 puff into the lungs every 6 (six) hours as needed for wheezing or shortness of breath.    Yes [provider]  Ascorbic Acid (  VITAMIN C PO) Take 1 tablet by mouth daily.   Yes [provider]  Calcium Carbonate-Vitamin D (CALCIUM 600+D PO) Take 1 tablet by mouth 2 (two) times daily.    Yes [provider]  cetirizine (ZYRTEC) 10 MG tablet Take 10 mg by mouth daily.   Yes [provider]  Cholecalciferol (VITAMIN D3) 5000 units CAPS Take 5,000 Units by mouth daily.   Yes [provider]  EPINEPHrine (EPI-PEN) 0.3 mg/0.3  mL SOAJ injection Inject 0.3 mg into the muscle once.   Yes [provider]  fexofenadine (ALLEGRA) 180 MG tablet Take 1 tablet (180 mg total) by mouth daily. 01/25/22  Yes Rodriguez-Southworth, Sunday Spillers, PA-C  fluticasone (FLONASE) 50 MCG/ACT nasal spray Place 1 spray into both nostrils daily as needed for allergies.    Yes [provider]  folic acid (FOLVITE) 1 MG tablet Take 1 mg by mouth daily.   Yes [provider]  meloxicam (MOBIC) 15 MG tablet Take 15 mg by mouth daily as needed. 09/02/20  Yes [provider]  metoprolol tartrate (LOPRESSOR) 25 MG tablet Take 50 mg by mouth at bedtime.   Yes [provider]  Multiple Vitamins-Minerals (MULTIVITAMIN WITH MINERALS) tablet Take 1 tablet by mouth daily.   Yes [provider]  predniSONE (DELTASONE) 20 MG tablet Take 1 tablet (20 mg total) by mouth daily with breakfast. 01/25/22  Yes Rodriguez-Southworth, Sunday Spillers, PA-C  VITAMIN E PO Take 1 capsule by mouth daily.   Yes [provider]    Family History Family History  Problem Relation Age of Onset   Kidney disease Father        Deceased, 45   Colon cancer Mother        Deceased 73   Healthy Son    Breast cancer Neg Hx     Social History Social History   Tobacco Use   Smoking status: Former    Types: Cigarettes    Quit date: 06/01/1971    Years since quitting: 50.6   Smokeless tobacco: Never  Vaping Use   Vaping Use: Never used  Substance Use Topics   Alcohol use: No   Drug use: No     Allergies   Pork-derived products, Penicillin v potassium, Bee venom, and Shellfish allergy   Review of Systems Review of Systems  Constitutional:  Positive for fatigue. Negative for chills, diaphoresis and fever.  HENT:  Positive for ear pain, postnasal drip, rhinorrhea and sore throat. Negative for congestion and trouble swallowing.   Eyes:  Negative for discharge.  Respiratory:  Positive for cough. Negative for chest tightness  and shortness of breath.   Musculoskeletal:  Negative for myalgias.  Skin:  Negative for rash.  Neurological:  Negative for headaches.  Hematological:  Negative for adenopathy.    Physical Exam Triage Vital Signs ED Triage Vitals  Enc Vitals Group     BP 01/25/22 0855 (!) 148/71     Pulse Rate 01/25/22 0855 92     Resp 01/25/22 0855 18     Temp 01/25/22 0855 98.6 F (37 C)     Temp Source 01/25/22 0855 Oral     SpO2 01/25/22 0855 100 %     Weight 01/25/22 0853 158 lb (71.7 kg)     Height 01/25/22 0853 5\' 6"  (1.676 m)     Head Circumference --      Peak Flow --      Pain Score 01/25/22 0853 7     Pain  Loc --      Pain Edu? --      Excl. in [place]? --    No data found.  Updated Vital Signs BP (!) 148/71 (BP Location: Left Arm)    Pulse 92    Temp 98.6 F (37 C) (Oral)    Resp 18    Ht 5\' 6"  (1.676 m)    Wt 158 lb (71.7 kg)    SpO2 100%    BMI 25.50 kg/m   Visual Acuity Right Eye Distance:   Left Eye Distance:   Bilateral Distance:    Right Eye Near:   Left Eye Near:    Bilateral Near:     Physical Exam Alert pt NAD who seems nasally congested EYES- non icterus, mild watering, no purulent drainage NOSE-  mucosa is pale pink with clear mucous.  TM- L gray and little dull, R Tm not visible due to cerumen impaction, canals are normal PHARYNX- clear, clear drainage noted.  NECK- supple with no nodes LUNGS- clear HEART - RRR with no murmurs SKIN- non jaundiced, no rashes.    UC Treatments / Results  Labs (all labs ordered are listed, but only abnormal results are displayed) Labs Reviewed - No data to display  EKG   Radiology No results found.  Procedures Rear lavage was done by the MA and the was was able to be removed.   Medications Ordered in UC Medications - No data to display  Initial Impression / Assessment and Plan / UC Course  I have reviewed the triage vital signs and the nursing notes. Ear lavage done in R ear and after this TM was examined and  is normal.  Has allergic rhinitis and R cerumen impaction She was told to hold from taking her current allergy medication and switch to Allegra which I sent rx for and to take this for a few week, I also placed her on a few days of prednisone     Final Clinical Impressions(s) / UC Diagnoses   Final diagnoses:  Allergic rhinitis, unspecified seasonality, unspecified trigger  Impacted cerumen of right ear     Discharge Instructions      Do saline rinses twice a day to flush allergens. Avoid saline rinses before bed time, do it about 3 hours before bed time and in the am.  Hold off from your current allergy medication and try the Allegra for a few weeks, then you may go back to it.      ED Prescriptions     Medication Sig Dispense Auth. Provider   predniSONE (DELTASONE) 20 MG tablet Take 1 tablet (20 mg total) by mouth daily with breakfast. 3 tablet Rodriguez-Southworth, Sunday Spillers, PA-C   fexofenadine (ALLEGRA) 180 MG tablet Take 1 tablet (180 mg total) by mouth daily. 30 tablet Rodriguez-Southworth, Sunday Spillers, PA-C      PDMP not reviewed this encounter.   Shelby Mattocks, PA-C 01/25/22 0945    Rodriguez-Southworth, Sunday Spillers, PA-C 01/30/22 1908

## 2022-01-25 NOTE — ED Triage Notes (Signed)
Pt c/o Sinus congestion, sore throat, and nasal drainage x1week

## 2022-02-03 DIAGNOSIS — R748 Abnormal levels of other serum enzymes: Secondary | ICD-10-CM | POA: Diagnosis not present

## 2022-02-03 DIAGNOSIS — Z86010 Personal history of colonic polyps: Secondary | ICD-10-CM | POA: Diagnosis not present

## 2022-02-03 DIAGNOSIS — R194 Change in bowel habit: Secondary | ICD-10-CM | POA: Diagnosis not present

## 2022-02-03 DIAGNOSIS — D696 Thrombocytopenia, unspecified: Secondary | ICD-10-CM | POA: Diagnosis not present

## 2022-02-04 ENCOUNTER — Other Ambulatory Visit: Payer: Self-pay | Admitting: Gastroenterology

## 2022-02-04 DIAGNOSIS — D696 Thrombocytopenia, unspecified: Secondary | ICD-10-CM

## 2022-02-04 DIAGNOSIS — R748 Abnormal levels of other serum enzymes: Secondary | ICD-10-CM

## 2022-02-10 DIAGNOSIS — M8588 Other specified disorders of bone density and structure, other site: Secondary | ICD-10-CM | POA: Diagnosis not present

## 2022-02-12 ENCOUNTER — Other Ambulatory Visit: Payer: Self-pay

## 2022-02-12 ENCOUNTER — Ambulatory Visit
Admission: RE | Admit: 2022-02-12 | Discharge: 2022-02-12 | Disposition: A | Discharge status: Home / Self Care | Payer: Medicare HMO | Source: Ambulatory Visit | Attending: Gastroenterology | Admitting: Gastroenterology

## 2022-02-12 DIAGNOSIS — K7689 Other specified diseases of liver: Secondary | ICD-10-CM | POA: Diagnosis not present

## 2022-02-12 DIAGNOSIS — K8020 Calculus of gallbladder without cholecystitis without obstruction: Secondary | ICD-10-CM | POA: Diagnosis not present

## 2022-02-12 DIAGNOSIS — D696 Thrombocytopenia, unspecified: Secondary | ICD-10-CM | POA: Diagnosis not present

## 2022-02-12 DIAGNOSIS — R748 Abnormal levels of other serum enzymes: Secondary | ICD-10-CM | POA: Diagnosis not present

## 2022-02-12 DIAGNOSIS — R161 Splenomegaly, not elsewhere classified: Secondary | ICD-10-CM | POA: Diagnosis not present

## 2022-02-17 DIAGNOSIS — K7460 Unspecified cirrhosis of liver: Secondary | ICD-10-CM | POA: Diagnosis not present

## 2022-02-27 DIAGNOSIS — H9202 Otalgia, left ear: Secondary | ICD-10-CM | POA: Diagnosis not present

## 2022-02-27 DIAGNOSIS — J019 Acute sinusitis, unspecified: Secondary | ICD-10-CM | POA: Diagnosis not present

## 2022-03-03 ENCOUNTER — Other Ambulatory Visit: Payer: Self-pay

## 2022-03-03 ENCOUNTER — Other Ambulatory Visit: Payer: Self-pay | Admitting: Family Medicine

## 2022-03-03 ENCOUNTER — Ambulatory Visit
Admission: RE | Admit: 2022-03-03 | Discharge: 2022-03-03 | Disposition: A | Discharge status: Home / Self Care | Payer: Medicare HMO | Source: Ambulatory Visit | Attending: Family Medicine | Admitting: Family Medicine

## 2022-03-03 DIAGNOSIS — Z1231 Encounter for screening mammogram for malignant neoplasm of breast: Secondary | ICD-10-CM | POA: Insufficient documentation

## 2022-03-03 DIAGNOSIS — N644 Mastodynia: Secondary | ICD-10-CM

## 2022-03-05 DIAGNOSIS — H9192 Unspecified hearing loss, left ear: Secondary | ICD-10-CM | POA: Diagnosis not present

## 2022-03-06 DIAGNOSIS — H90A22 Sensorineural hearing loss, unilateral, left ear, with restricted hearing on the contralateral side: Secondary | ICD-10-CM | POA: Diagnosis not present

## 2022-03-06 DIAGNOSIS — H9122 Sudden idiopathic hearing loss, left ear: Secondary | ICD-10-CM | POA: Diagnosis not present

## 2022-03-06 DIAGNOSIS — H903 Sensorineural hearing loss, bilateral: Secondary | ICD-10-CM | POA: Diagnosis not present

## 2022-03-19 DIAGNOSIS — Z8679 Personal history of other diseases of the circulatory system: Secondary | ICD-10-CM | POA: Diagnosis not present

## 2022-03-19 DIAGNOSIS — R079 Chest pain, unspecified: Secondary | ICD-10-CM | POA: Diagnosis not present

## 2022-03-21 ENCOUNTER — Ambulatory Visit
Admission: RE | Admit: 2022-03-21 | Discharge: 2022-03-21 | Disposition: A | Discharge status: Home / Self Care | Payer: Medicare HMO | Source: Ambulatory Visit | Attending: Family Medicine | Admitting: Family Medicine

## 2022-03-21 DIAGNOSIS — N644 Mastodynia: Secondary | ICD-10-CM

## 2022-03-27 DIAGNOSIS — K7460 Unspecified cirrhosis of liver: Secondary | ICD-10-CM | POA: Diagnosis not present

## 2022-03-27 DIAGNOSIS — R194 Change in bowel habit: Secondary | ICD-10-CM | POA: Diagnosis not present

## 2022-03-27 DIAGNOSIS — D122 Benign neoplasm of ascending colon: Secondary | ICD-10-CM | POA: Diagnosis not present

## 2022-03-27 DIAGNOSIS — Z86010 Personal history of colonic polyps: Secondary | ICD-10-CM | POA: Diagnosis not present

## 2022-03-28 DIAGNOSIS — H903 Sensorineural hearing loss, bilateral: Secondary | ICD-10-CM | POA: Diagnosis not present

## 2022-04-11 ENCOUNTER — Inpatient Hospital Stay: Payer: Medicare HMO | Admitting: Oncology

## 2022-04-11 ENCOUNTER — Inpatient Hospital Stay: Payer: Medicare HMO | Attending: Oncology

## 2022-04-11 ENCOUNTER — Encounter: Payer: Self-pay | Admitting: Oncology

## 2022-04-11 DIAGNOSIS — Z86010 Personal history of colonic polyps: Secondary | ICD-10-CM | POA: Diagnosis not present

## 2022-04-11 DIAGNOSIS — K219 Gastro-esophageal reflux disease without esophagitis: Secondary | ICD-10-CM | POA: Insufficient documentation

## 2022-04-11 DIAGNOSIS — I10 Essential (primary) hypertension: Secondary | ICD-10-CM | POA: Insufficient documentation

## 2022-04-11 DIAGNOSIS — D693 Immune thrombocytopenic purpura: Secondary | ICD-10-CM

## 2022-04-11 DIAGNOSIS — Z800 Family history of malignant neoplasm of digestive organs: Secondary | ICD-10-CM | POA: Diagnosis not present

## 2022-04-11 DIAGNOSIS — I4891 Unspecified atrial fibrillation: Secondary | ICD-10-CM | POA: Diagnosis not present

## 2022-04-11 DIAGNOSIS — J45909 Unspecified asthma, uncomplicated: Secondary | ICD-10-CM | POA: Diagnosis not present

## 2022-04-11 DIAGNOSIS — D696 Thrombocytopenia, unspecified: Secondary | ICD-10-CM

## 2022-04-11 LAB — CBC WITH DIFFERENTIAL/PLATELET
Abs Immature Granulocytes: 0.01 10*3/uL (ref 0.00–0.07)
Basophils Absolute: 0 10*3/uL (ref 0.0–0.1)
Basophils Relative: 1 %
Eosinophils Absolute: 0.2 10*3/uL (ref 0.0–0.5)
Eosinophils Relative: 3 %
HCT: 39.9 % (ref 36.0–46.0)
Hemoglobin: 13.7 g/dL (ref 12.0–15.0)
Immature Granulocytes: 0 %
Lymphocytes Relative: 35 %
Lymphs Abs: 1.9 10*3/uL (ref 0.7–4.0)
MCH: 31.9 pg (ref 26.0–34.0)
MCHC: 34.3 g/dL (ref 30.0–36.0)
MCV: 93 fL (ref 80.0–100.0)
Monocytes Absolute: 0.5 10*3/uL (ref 0.1–1.0)
Monocytes Relative: 9 %
Neutro Abs: 2.8 10*3/uL (ref 1.7–7.7)
Neutrophils Relative %: 52 %
Platelets: 113 10*3/uL — ABNORMAL LOW (ref 150–400)
RBC: 4.29 MIL/uL (ref 3.87–5.11)
RDW: 12.4 % (ref 11.5–15.5)
WBC: 5.4 10*3/uL (ref 4.0–10.5)
nRBC: 0 % (ref 0.0–0.2)

## 2022-04-11 NOTE — Progress Notes (Signed)
? ? ? ?Hematology/Oncology Consult note ?[place]  ?[phone] [phone] ? ?Patient Care Team: ?Dion Body, MD as PCP - General (Family Medicine) ?Dion Body, MD (Family Medicine)  ? ?Name of the patient: Traci Lynn  ?[id number]  ?1951/09/12  ? ?Date of visit: 04/11/22 ? ?Diagnosis-ITP ? ?Chief complaint/ Reason for visit-routine follow-up of ITP ? ?Heme/Onc history: Patient is a 71 year old female with a past medical history significant for GERD, hypertension, Barrett's esophagus who has been referred for thrombocytopenia.  Most recent CBC from 09/17/2020 showed white count of 6.4, H&H of 14.4/42.2 and a platelet count of 113.  Looking back at her prior platelet counts in 2018 her platelet count was 147 and then 91 in April 2020 and 129 in October 2020.  She has had a normal white count and hemoglobin consistently.  Hepatitis C testing was negative.  CMP was normal except for mildly elevated alkaline phosphatase of 147.  Patient is doing well and denies any changes in her appetite or easy bruising.   ?  ?Blood work from 10/02/2020 were as follows: CBC showed white count of 7, H&H of 14/40.2 and a platelet count of 117.  B12 folate and LDH were normal.  Smear review was unremarkable. ?  ? ?Interval history-patient is waiting to hear back from [facility] regarding her follow-up for cirrhosis.  She also reports occasional constipation and diarrhea.  Denies any bleeding or bruising ? ?ECOG PS- 0 ?Pain scale- 0 ? ? ?Review of systems- Review of Systems  ?Constitutional:  Negative for chills, fever, malaise/fatigue and weight loss.  ?HENT:  Negative for congestion, ear discharge and nosebleeds.   ?Eyes:  Negative for blurred vision.  ?Respiratory:  Negative for cough, hemoptysis, sputum production, shortness of breath and wheezing.   ?Cardiovascular:  Negative for chest pain, palpitations, orthopnea and claudication.  ?Gastrointestinal:  Negative for abdominal  pain, blood in stool, constipation, diarrhea, heartburn, melena, nausea and vomiting.  ?Genitourinary:  Negative for dysuria, flank pain, frequency, hematuria and urgency.  ?Musculoskeletal:  Negative for back pain, joint pain and myalgias.  ?Skin:  Negative for rash.  ?Neurological:  Negative for dizziness, tingling, focal weakness, seizures, weakness and headaches.  ?Endo/Heme/Allergies:  Does not bruise/bleed easily.  ?Psychiatric/Behavioral:  Negative for depression and suicidal ideas. The patient does not have insomnia.    ? ? ? ?Allergies  ?Allergen Reactions  ? Pork-Derived Products Anaphylaxis  ? Penicillin V Potassium Hives, Rash and Other (See Comments)  ?  Has patient had a PCN reaction causing immediate rash, facial/tongue/throat swelling, SOB or lightheadedness with hypotension: Yes ?Has patient had a PCN reaction causing severe rash involving mucus membranes or skin necrosis: Yes ?Has patient had a PCN reaction that required hospitalization: No ?Has patient had a PCN reaction occurring within the last 10 years: No ?If all of the above answers are "NO", then may proceed with Cephalosporin use. ?  ? Bee Venom Swelling  ? Shellfish Allergy Hives and Swelling  ? ? ? ?Past Medical History:  ?Diagnosis Date  ? Allergic genetic state   ? Asthma   ? Cirrhosis ([place])   ? Dysrhythmia   ? GERD (gastroesophageal reflux disease)   ? Hypertension   ? Iron overload   ? Lumbago   ? Paroxysmal atrial tachycardia ([place])   ? Tubular adenoma of colon   ? ? ? ?Past Surgical History:  ?Procedure Laterality Date  ? ABDOMINAL HYSTERECTOMY  1979  ? CARPAL TUNNEL RELEASE Left 1997  ?  CATARACT EXTRACTION W/PHACO Right 04/15/2017  ? Procedure: CATARACT EXTRACTION PHACO AND INTRAOCULAR LENS PLACEMENT (IOC);  Surgeon: Estill Cotta, MD;  Location: [facility] ORS;  Service: Ophthalmology;  Laterality: Right;  Lot# [id number] ?[place]: 01:02.8 ?AP%: 19.7 ?CDE: 25.43  ? CATARACT EXTRACTION W/PHACO Left 05/13/2017  ? Procedure: CATARACT EXTRACTION  PHACO AND INTRAOCULAR LENS PLACEMENT (IOC);  Surgeon: Estill Cotta, MD;  Location: [facility] ORS;  Service: Ophthalmology;  Laterality: Left;  [place] 1:07.6 ?AP% 22.9 ?CDE 31.29 ?Fluid Pack lot # [id number]  ? COLONOSCOPY WITH PROPOFOL N/A 10/09/2017  ? Procedure: COLONOSCOPY WITH PROPOFOL;  Surgeon: Lollie Sails, MD;  Location: [facility] ENDOSCOPY;  Service: Endoscopy;  Laterality: N/A;  ? ESOPHAGOGASTRODUODENOSCOPY (EGD) WITH PROPOFOL N/A 10/09/2017  ? Procedure: ESOPHAGOGASTRODUODENOSCOPY (EGD) WITH PROPOFOL;  Surgeon: Lollie Sails, MD;  Location: [facility] ENDOSCOPY;  Service: Endoscopy;  Laterality: N/A;  ? ESOPHAGOGASTRODUODENOSCOPY (EGD) WITH PROPOFOL N/A 12/22/2017  ? Procedure: ESOPHAGOGASTRODUODENOSCOPY (EGD) WITH PROPOFOL;  Surgeon: Lollie Sails, MD;  Location: [facility] ENDOSCOPY;  Service: Endoscopy;  Laterality: N/A;  ? ESOPHAGOGASTRODUODENOSCOPY (EGD) WITH PROPOFOL N/A 05/23/2020  ? Procedure: ESOPHAGOGASTRODUODENOSCOPY (EGD) WITH PROPOFOL;  Surgeon: Toledo, Benay Pike, MD;  Location: [facility] ENDOSCOPY;  Service: Gastroenterology;  Laterality: N/A;  ? EYE SURGERY    ? KNEE ARTHROSCOPY WITH MEDIAL MENISECTOMY Right 08/19/2019  ? Procedure: KNEE ARTHROSCOPY WITH PARTIAL MEDIAL MENISECTOMY,  PLICA EXCISION;  Surgeon: Leim Fabry, MD;  Location: [place];  Service: Orthopedics;  Laterality: Right;  ? SHOULDER ARTHROSCOPY WITH OPEN ROTATOR CUFF REPAIR Left 09/24/2018  ? Procedure: SHOULDER ARTHROSCOPY WITH OPEN ROTATOR CUFF REPAIR;  Surgeon: Leim Fabry, MD;  Location: [facility] ORS;  Service: Orthopedics;  Laterality: Left;  ? TMJ ARTHROPLASTY    ? TONSILLECTOMY AND ADENOIDECTOMY    ? ? ?Social History  ? ?Socioeconomic History  ? Marital status: Married  ?  Spouse name: Not on file  ? Number of children: Not on file  ? Years of education: Not on file  ? Highest education level: Not on file  ?Occupational History  ? Occupation: Solicitor  ?Tobacco Use  ? Smoking status: Former  ?  Types: Cigarettes  ?   Quit date: 06/01/1971  ?  Years since quitting: 50.8  ? Smokeless tobacco: Never  ?Vaping Use  ? Vaping Use: Never used  ?Substance and Sexual Activity  ? Alcohol use: No  ? Drug use: No  ? Sexual activity: Not Currently  ?Other Topics Concern  ? Not on file  ?Social History Narrative  ? Lives with husband, married 7 years.  She has one son.  ? Retired from [employer].   ? Highest level of education:  4 years of college  ? ?Social Determinants of Health  ? ?Financial Resource Strain: Not on file  ?Food Insecurity: Not on file  ?Transportation Needs: Not on file  ?Physical Activity: Not on file  ?Stress: Not on file  ?Social Connections: Not on file  ?Intimate Partner Violence: Not on file  ? ? ?Family History  ?Problem Relation Age of Onset  ? Kidney disease Father   ?     Deceased, 72  ? Colon cancer Mother   ?     Deceased 66  ? Healthy Son   ? Breast cancer Neg Hx   ? ? ? ?Current Outpatient Medications:  ?  albuterol (PROVENTIL HFA;VENTOLIN HFA) 108 (90 Base) MCG/ACT inhaler, Inhale 1 puff into the lungs every 6 (six) hours as needed for wheezing or shortness of breath. , Disp: ,  Rfl:  ?  Ascorbic Acid (VITAMIN C PO), Take 500 mg by mouth daily., Disp: , Rfl:  ?  Calcium Carbonate-Vitamin D (CALCIUM 600+D PO), Take 1 tablet by mouth 2 (two) times daily. , Disp: , Rfl:  ?  cetirizine (ZYRTEC) 10 MG tablet, Take 10 mg by mouth daily., Disp: , Rfl:  ?  Cholecalciferol (VITAMIN D3) 5000 units CAPS, Take 5,000 Units by mouth daily., Disp: , Rfl:  ?  EPINEPHrine (EPI-PEN) 0.3 mg/0.3 mL SOAJ injection, Inject 0.3 mg into the muscle once., Disp: , Rfl:  ?  fluticasone (FLONASE) 50 MCG/ACT nasal spray, Place 1 spray into both nostrils daily as needed for allergies. , Disp: , Rfl:  ?  folic acid (FOLVITE) 1 MG tablet, Take 1 mg by mouth daily., Disp: , Rfl:  ?  metoprolol tartrate (LOPRESSOR) 25 MG tablet, Take 50 mg by mouth at bedtime., Disp: , Rfl:  ?  VITAMIN E PO, Take 1 capsule by mouth daily., Disp: , Rfl:   ? ?Physical exam:  ?Physical Exam ?Constitutional:   ?   General: She is not in acute distress. ?Cardiovascular:  ?   Rate and Rhythm: Normal rate and regular rhythm.  ?   Heart sounds: Normal heart sounds.

## 2022-04-11 NOTE — Progress Notes (Signed)
Pt has been diagnose of overload iron and cirrhosis. Pt waiting on appt with GI. She sees [facility]. They tried to do colonoscopy and pt was not cleaned out. Pt states that she goes from constipation to diarrhea never in between. ?

## 2022-04-23 DIAGNOSIS — M25551 Pain in right hip: Secondary | ICD-10-CM | POA: Diagnosis not present

## 2022-04-23 DIAGNOSIS — M545 Low back pain, unspecified: Secondary | ICD-10-CM | POA: Diagnosis not present

## 2022-04-25 DIAGNOSIS — M5416 Radiculopathy, lumbar region: Secondary | ICD-10-CM | POA: Diagnosis not present

## 2022-04-25 DIAGNOSIS — D696 Thrombocytopenia, unspecified: Secondary | ICD-10-CM | POA: Diagnosis not present

## 2022-04-25 DIAGNOSIS — M5136 Other intervertebral disc degeneration, lumbar region: Secondary | ICD-10-CM | POA: Diagnosis not present

## 2022-04-30 DIAGNOSIS — D696 Thrombocytopenia, unspecified: Secondary | ICD-10-CM | POA: Diagnosis not present

## 2022-05-01 DIAGNOSIS — M48062 Spinal stenosis, lumbar region with neurogenic claudication: Secondary | ICD-10-CM | POA: Diagnosis not present

## 2022-05-01 DIAGNOSIS — M5416 Radiculopathy, lumbar region: Secondary | ICD-10-CM | POA: Diagnosis not present

## 2022-05-07 DIAGNOSIS — R748 Abnormal levels of other serum enzymes: Secondary | ICD-10-CM | POA: Diagnosis not present

## 2022-05-07 DIAGNOSIS — K5909 Other constipation: Secondary | ICD-10-CM | POA: Diagnosis not present

## 2022-05-07 DIAGNOSIS — Z86010 Personal history of colonic polyps: Secondary | ICD-10-CM | POA: Diagnosis not present

## 2022-05-23 DIAGNOSIS — M5416 Radiculopathy, lumbar region: Secondary | ICD-10-CM | POA: Diagnosis not present

## 2022-05-23 DIAGNOSIS — M48062 Spinal stenosis, lumbar region with neurogenic claudication: Secondary | ICD-10-CM | POA: Diagnosis not present

## 2022-05-23 DIAGNOSIS — D75839 Thrombocytosis, unspecified: Secondary | ICD-10-CM | POA: Diagnosis not present

## 2022-05-23 DIAGNOSIS — M5136 Other intervertebral disc degeneration, lumbar region: Secondary | ICD-10-CM | POA: Diagnosis not present

## 2022-05-29 DIAGNOSIS — D696 Thrombocytopenia, unspecified: Secondary | ICD-10-CM | POA: Diagnosis not present

## 2022-05-29 DIAGNOSIS — M5416 Radiculopathy, lumbar region: Secondary | ICD-10-CM | POA: Diagnosis not present

## 2022-05-29 DIAGNOSIS — M48062 Spinal stenosis, lumbar region with neurogenic claudication: Secondary | ICD-10-CM | POA: Diagnosis not present

## 2022-05-30 ENCOUNTER — Telehealth: Payer: Self-pay | Admitting: *Deleted

## 2022-05-30 NOTE — Telephone Encounter (Signed)
Called and message given that clearance has been faxed to clinic. Understanding verbalized.

## 2022-06-03 DIAGNOSIS — D75839 Thrombocytosis, unspecified: Secondary | ICD-10-CM | POA: Diagnosis not present

## 2022-06-04 DIAGNOSIS — M47816 Spondylosis without myelopathy or radiculopathy, lumbar region: Secondary | ICD-10-CM | POA: Diagnosis not present

## 2022-07-01 ENCOUNTER — Other Ambulatory Visit: Payer: Self-pay | Admitting: Gastroenterology

## 2022-07-01 DIAGNOSIS — R1011 Right upper quadrant pain: Secondary | ICD-10-CM | POA: Diagnosis not present

## 2022-07-01 DIAGNOSIS — R197 Diarrhea, unspecified: Secondary | ICD-10-CM

## 2022-07-02 ENCOUNTER — Other Ambulatory Visit: Payer: Self-pay | Admitting: Family Medicine

## 2022-07-02 DIAGNOSIS — M5416 Radiculopathy, lumbar region: Secondary | ICD-10-CM

## 2022-07-02 DIAGNOSIS — M5136 Other intervertebral disc degeneration, lumbar region: Secondary | ICD-10-CM | POA: Diagnosis not present

## 2022-07-03 DIAGNOSIS — R1011 Right upper quadrant pain: Secondary | ICD-10-CM | POA: Diagnosis not present

## 2022-07-03 DIAGNOSIS — R197 Diarrhea, unspecified: Secondary | ICD-10-CM | POA: Diagnosis not present

## 2022-07-04 ENCOUNTER — Ambulatory Visit
Admission: RE | Admit: 2022-07-04 | Discharge: 2022-07-04 | Disposition: A | Discharge status: Home / Self Care | Payer: Medicare HMO | Source: Ambulatory Visit | Attending: Gastroenterology | Admitting: Gastroenterology

## 2022-07-04 DIAGNOSIS — R197 Diarrhea, unspecified: Secondary | ICD-10-CM | POA: Diagnosis not present

## 2022-07-04 DIAGNOSIS — R1011 Right upper quadrant pain: Secondary | ICD-10-CM | POA: Insufficient documentation

## 2022-07-04 DIAGNOSIS — K5730 Diverticulosis of large intestine without perforation or abscess without bleeding: Secondary | ICD-10-CM | POA: Diagnosis not present

## 2022-07-04 DIAGNOSIS — K8020 Calculus of gallbladder without cholecystitis without obstruction: Secondary | ICD-10-CM | POA: Diagnosis not present

## 2022-07-04 DIAGNOSIS — K7460 Unspecified cirrhosis of liver: Secondary | ICD-10-CM | POA: Diagnosis not present

## 2022-07-04 MED ORDER — IOHEXOL 300 MG/ML  SOLN
100.0000 mL | Freq: Once | INTRAMUSCULAR | Status: AC | PRN
  Administered 2022-07-04: 100 mL via INTRAVENOUS

## 2022-07-14 ENCOUNTER — Other Ambulatory Visit: Payer: Medicare HMO

## 2022-07-25 ENCOUNTER — Ambulatory Visit
Admission: RE | Admit: 2022-07-25 | Discharge: 2022-07-25 | Disposition: A | Discharge status: Home / Self Care | Payer: Medicare HMO | Source: Ambulatory Visit | Attending: Family Medicine | Admitting: Family Medicine

## 2022-07-25 DIAGNOSIS — M545 Low back pain, unspecified: Secondary | ICD-10-CM | POA: Diagnosis not present

## 2022-07-25 DIAGNOSIS — M5416 Radiculopathy, lumbar region: Secondary | ICD-10-CM

## 2022-07-28 ENCOUNTER — Encounter: Payer: Self-pay | Admitting: *Deleted

## 2022-07-29 ENCOUNTER — Ambulatory Visit: Payer: Medicare HMO | Admitting: Anesthesiology

## 2022-07-29 ENCOUNTER — Ambulatory Visit
Admission: RE | Admit: 2022-07-29 | Discharge: 2022-07-29 | Disposition: A | Discharge status: Home / Self Care | Payer: Medicare HMO | Attending: Gastroenterology | Admitting: Gastroenterology

## 2022-07-29 ENCOUNTER — Encounter: Payer: Self-pay | Admitting: *Deleted

## 2022-07-29 ENCOUNTER — Other Ambulatory Visit: Payer: Self-pay

## 2022-07-29 ENCOUNTER — Encounter
Admission: RE | Disposition: A | Discharge status: Home / Self Care | Payer: Self-pay | Source: Home / Self Care | Attending: Gastroenterology

## 2022-07-29 DIAGNOSIS — D123 Benign neoplasm of transverse colon: Secondary | ICD-10-CM | POA: Insufficient documentation

## 2022-07-29 DIAGNOSIS — I10 Essential (primary) hypertension: Secondary | ICD-10-CM | POA: Diagnosis not present

## 2022-07-29 DIAGNOSIS — J45909 Unspecified asthma, uncomplicated: Secondary | ICD-10-CM | POA: Insufficient documentation

## 2022-07-29 DIAGNOSIS — K219 Gastro-esophageal reflux disease without esophagitis: Secondary | ICD-10-CM | POA: Diagnosis not present

## 2022-07-29 DIAGNOSIS — Z90710 Acquired absence of both cervix and uterus: Secondary | ICD-10-CM | POA: Diagnosis not present

## 2022-07-29 DIAGNOSIS — Z1211 Encounter for screening for malignant neoplasm of colon: Secondary | ICD-10-CM | POA: Diagnosis not present

## 2022-07-29 DIAGNOSIS — Z87891 Personal history of nicotine dependence: Secondary | ICD-10-CM | POA: Diagnosis not present

## 2022-07-29 DIAGNOSIS — K640 First degree hemorrhoids: Secondary | ICD-10-CM | POA: Insufficient documentation

## 2022-07-29 DIAGNOSIS — K7460 Unspecified cirrhosis of liver: Secondary | ICD-10-CM | POA: Diagnosis not present

## 2022-07-29 DIAGNOSIS — Z86010 Personal history of colonic polyps: Secondary | ICD-10-CM | POA: Diagnosis not present

## 2022-07-29 DIAGNOSIS — K635 Polyp of colon: Secondary | ICD-10-CM | POA: Diagnosis not present

## 2022-07-29 DIAGNOSIS — I471 Supraventricular tachycardia: Secondary | ICD-10-CM | POA: Diagnosis not present

## 2022-07-29 HISTORY — PX: COLONOSCOPY WITH PROPOFOL: SHX5780

## 2022-07-29 SURGERY — COLONOSCOPY WITH PROPOFOL
Anesthesia: General

## 2022-07-29 MED ORDER — SODIUM CHLORIDE 0.9 % IV SOLN: INTRAVENOUS | Status: DC | PRN

## 2022-07-29 MED ORDER — MIDAZOLAM HCL 2 MG/2ML IJ SOLN
INTRAMUSCULAR | Status: AC
  Filled 2022-07-29: qty 2

## 2022-07-29 MED ORDER — MIDAZOLAM HCL 2 MG/2ML IJ SOLN
INTRAMUSCULAR | Status: DC | PRN
  Administered 2022-07-29: 2 mg via INTRAVENOUS

## 2022-07-29 MED ORDER — FENTANYL CITRATE (PF) 100 MCG/2ML IJ SOLN
INTRAMUSCULAR | Status: DC | PRN
Start: 2022-07-29 — End: 2022-07-29
  Administered 2022-07-29: 25 ug via INTRAVENOUS

## 2022-07-29 MED ORDER — SODIUM CHLORIDE 0.9 % IV SOLN: INTRAVENOUS | Status: DC

## 2022-07-29 MED ORDER — PROPOFOL 10 MG/ML IV BOLUS
INTRAVENOUS | Status: DC | PRN
  Administered 2022-07-29: 60 mg via INTRAVENOUS

## 2022-07-29 MED ORDER — FENTANYL CITRATE (PF) 100 MCG/2ML IJ SOLN
INTRAMUSCULAR | Status: AC
  Filled 2022-07-29: qty 2

## 2022-07-29 MED ORDER — PROPOFOL 500 MG/50ML IV EMUL
INTRAVENOUS | Status: DC | PRN
  Administered 2022-07-29: 100 ug/kg/min via INTRAVENOUS

## 2022-07-29 NOTE — Interval H&P Note (Signed)
History and Physical Interval Note:  07/29/2022 12:02 PM  Traci Lynn  has presented today for surgery, with the diagnosis of HX OF ADENOMATOUS POLYP OF COLON.  The various methods of treatment have been discussed with the patient and family. After consideration of risks, benefits and other options for treatment, the patient has consented to  Procedure(s): COLONOSCOPY WITH PROPOFOL (N/A) as a surgical intervention.  The patient's history has been reviewed, patient examined, no change in status, stable for surgery.  I have reviewed the patient's chart and labs.  Questions were answered to the patient's satisfaction.     Lesly Rubenstein  Ok to proceed with colonoscopy

## 2022-07-29 NOTE — Op Note (Signed)
[facility] Patient Name: Traci Lynn Procedure Date: 07/29/2022 11:19 AM MRN: [id number] Account #: [id number] Date of Birth: 12-04-1951 Admit Type: Outpatient Age: 71 Room: [facility] ENDO ROOM 1 Gender: Female Note Status: Finalized Instrument Name: Jasper Riling [id number] Procedure:             Colonoscopy Indications:           Surveillance: Personal history of adenomatous polyps,                         inadequate prep on last colonoscopy (less than 1 year                         ago) Providers:             Andrey Farmer MD, MD Referring MD:          Dion Body (Referring MD) Medicines:             Monitored Anesthesia Care Complications:         No immediate complications. Estimated blood loss:                         Minimal. Procedure:             Pre-Anesthesia Assessment:                        - Prior to the procedure, a History and Physical was                         performed, and patient medications and allergies were                         reviewed. The patient is competent. The risks and                         benefits of the procedure and the sedation options and                         risks were discussed with the patient. All questions                         were answered and informed consent was obtained.                         Patient identification and proposed procedure were                         verified by the physician, the nurse, the                         anesthesiologist, the anesthetist and the technician                         in the endoscopy suite. Mental Status Examination:                         alert and oriented. Airway Examination: normal  oropharyngeal airway and neck mobility. Respiratory                         Examination: clear to auscultation. CV Examination:                         normal. Prophylactic Antibiotics: The patient does not                          require prophylactic antibiotics. Prior                         Anticoagulants: The patient has taken no previous                         anticoagulant or antiplatelet agents. ASA Grade                         Assessment: III - A patient with severe systemic                         disease. After reviewing the risks and benefits, the                         patient was deemed in satisfactory condition to                         undergo the procedure. The anesthesia plan was to use                         monitored anesthesia care (MAC). Immediately prior to                         administration of medications, the patient was                         re-assessed for adequacy to receive sedatives. The                         heart rate, respiratory rate, oxygen saturations,                         blood pressure, adequacy of pulmonary ventilation, and                         response to care were monitored throughout the                         procedure. The physical status of the patient was                         re-assessed after the procedure.                        After obtaining informed consent, the colonoscope was                         passed under direct vision. Throughout the procedure,  the patient's blood pressure, pulse, and oxygen                         saturations were monitored continuously. The                         Colonoscope was introduced through the anus and                         advanced to the the terminal ileum. The colonoscopy                         was performed without difficulty. The patient                         tolerated the procedure well. The quality of the bowel                         preparation was adequate to identify polyps. Findings:      The perianal and digital rectal examinations were normal.      Seven sessile polyps were found in the transverse colon. The polyps were       2 to 8 mm in size. These polyps were  removed with a cold snare.       Resection and retrieval were complete. Estimated blood loss was minimal.      Internal hemorrhoids were found during retroflexion. The hemorrhoids       were Grade I (internal hemorrhoids that do not prolapse).      The exam was otherwise without abnormality on direct and retroflexion       views. Impression:            - Seven 2 to 8 mm polyps in the transverse colon,                         removed with a cold snare. Resected and retrieved.                        - Internal hemorrhoids.                        - The examination was otherwise normal on direct and                         retroflexion views. Recommendation:        - Discharge patient to home.                        - Resume previous diet.                        - Continue present medications.                        - Await pathology results.                        - Repeat colonoscopy in 3 years for surveillance.                        - Return to  referring physician as previously                         scheduled. Procedure Code(s):     --- Professional ---                        [phone], Colonoscopy, flexible; with removal of                         tumor(s), polyp(s), or other lesion(s) by snare                         technique Diagnosis Code(s):     --- Professional ---                        Z86.010, Personal history of colonic polyps                        K63.5, Polyp of colon                        K64.0, First degree hemorrhoids CPT copyright 2019 American Medical Association. All rights reserved. The codes documented in this report are preliminary and upon coder review may  be revised to meet current compliance requirements. Andrey Farmer MD, MD 07/29/2022 12:35:34 PM Number of Addenda: 0 Note Initiated On: 07/29/2022 11:19 AM Scope Withdrawal Time: 0 hours 17 minutes 54 seconds  Total Procedure Duration: 0 hours 23 minutes 1 second  Estimated Blood Loss:  Estimated blood  loss was minimal.      [facility]

## 2022-07-29 NOTE — Anesthesia Preprocedure Evaluation (Addendum)
Anesthesia Evaluation  Patient identified by MRN, date of birth, ID band Patient awake    Reviewed: Allergy & Precautions, NPO status , Patient's Chart, lab work & pertinent test results  History of Anesthesia Complications Negative for: history of anesthetic complications  Airway Mallampati: IV   Neck ROM: Full    Dental   Crowns :   Pulmonary asthma , former smoker (quit 1972),    Pulmonary exam normal breath sounds clear to auscultation       Cardiovascular hypertension, Normal cardiovascular exam+ dysrhythmias (paroxysmal atrial tachycardia)  Rhythm:Regular Rate:Normal  Stress echo 09/04/21:  Normal Stress Echocardiogram  NO VALVULAR STENOSIS NOTED    Neuro/Psych negative neurological ROS     GI/Hepatic GERD  ,(+) Cirrhosis       ,   Endo/Other  negative endocrine ROS  Renal/GU negative Renal ROS     Musculoskeletal   Abdominal   Peds  Hematology negative hematology ROS (+)   Anesthesia Other Findings Cardiology note 03/19/22:  71 y.o. female with  1. History of paroxysmal atrial tachycardia - followed by Dr. Saralyn Pilar  2. Chest pain with high risk for cardiac etiology   71 year old female with history of paroxysmal atrial tachycardia, on metoprolol tartrate, with infrequent episodes of tachycardia. 72-hour Holter monitor revealed predominant sinus rhythm with a mean heart rate of 70 bpm, with heart rate ranging from 46 to 120 bpm. There were rare premature ventricular contractions and rare premature atrial contractions, with one brief atrial run. The patient had no episodes of palpitations while wearing the monitor. Stress echocardiogram 09/04/2021 revealed normal left ventricular function, without evidence for scar or ischemia.  Plan   1. Continue current medications 2. Counseled patient about low-sodium diet 3. DASH diet printed instructions given to the patient 4. Advised patient to limit caffeine  intake 5. Defer cardiac catheterization 6. Return to clinic for follow-up in 7-month  No orders of the defined types were placed in this encounter.  Return in about 6 months (around 09/18/2022).   Reproductive/Obstetrics                            Anesthesia Physical Anesthesia Plan  ASA: 3  Anesthesia Plan: General   Post-op Pain Management:    Induction: Intravenous  PONV Risk Score and Plan: 3 and Propofol infusion, TIVA and Treatment may vary due to age or medical condition  Airway Management Planned: Natural Airway  Additional Equipment:   Intra-op Plan:   Post-operative Plan:   Informed Consent: I have reviewed the patients History and Physical, chart, labs and discussed the procedure including the risks, benefits and alternatives for the proposed anesthesia with the patient or authorized representative who has indicated his/her understanding and acceptance.       Plan Discussed with: CRNA  Anesthesia Plan Comments: (LMA/GETA backup discussed.  Patient consented for risks of anesthesia including but not limited to:  - adverse reactions to medications - damage to eyes, teeth, lips or other oral mucosa - nerve damage due to positioning  - sore throat or hoarseness - damage to heart, brain, nerves, lungs, other parts of body or loss of life  Informed patient about role of CRNA in peri- and intra-operative care.  Patient voiced understanding.)        Anesthesia Quick Evaluation

## 2022-07-29 NOTE — Transfer of Care (Addendum)
Immediate Anesthesia Transfer of Care Note  Patient: Traci Lynn  Procedure(s) Performed: COLONOSCOPY WITH PROPOFOL  Patient Location: PACU and Endoscopy Unit  Anesthesia Type:MAC  Level of Consciousness: drowsy  Airway & Oxygen Therapy: Patient Spontanous Breathing  Post-op Assessment: Report given to RN and Post -op Vital signs reviewed and stable  Post vital signs: Reviewed and stable  Last Vitals:  Vitals Value Taken Time  BP    Temp    Pulse    Resp    SpO2      Last Pain:  Vitals:   07/29/22 1104  TempSrc: Temporal  PainSc: 0-No pain         Complications: No notable events documented.

## 2022-07-29 NOTE — Anesthesia Postprocedure Evaluation (Signed)
Anesthesia Post Note  Patient: Traci Lynn Belongia  Procedure(s) Performed: COLONOSCOPY WITH PROPOFOL  Patient location during evaluation: PACU Anesthesia Type: General Level of consciousness: awake and alert, oriented and patient cooperative Pain management: pain level controlled Vital Signs Assessment: post-procedure vital signs reviewed and stable Respiratory status: spontaneous breathing, nonlabored ventilation and respiratory function stable Cardiovascular status: blood pressure returned to baseline and stable Postop Assessment: adequate PO intake Anesthetic complications: no   No notable events documented.   Last Vitals:  Vitals:   07/29/22 1244 07/29/22 1254  BP: 115/67 111/83  Pulse: 77 77  Resp: 15 18  Temp:    SpO2: 99% 100%    Last Pain:  Vitals:   07/29/22 1254  TempSrc:   PainSc: 0-No pain                 Darrin Nipper

## 2022-07-29 NOTE — H&P (Signed)
Outpatient short stay form Pre-procedure 07/29/2022  Traci Rubenstein, MD  Primary Physician: Dion Body, MD  Reason for visit:  Surveillance colonoscopy  History of present illness:    71 y/o lady with history of advanced fibrosis here for surveillance colonoscopy. Had colonoscopy in April but prep was poor. One TA was removed. No blood thinners. History of hysterectomy.    Current Facility-Administered Medications:    0.9 %  sodium chloride infusion, , Intravenous, Continuous, Draco Malczewski, Hilton Cork, MD, Last Rate: 20 mL/hr at 07/29/22 1112, New Bag at 07/29/22 1112  Medications Prior to Admission  Medication Sig Dispense Refill Last Dose   albuterol (PROVENTIL HFA;VENTOLIN HFA) 108 (90 Base) MCG/ACT inhaler Inhale 1 puff into the lungs every 6 (six) hours as needed for wheezing or shortness of breath.    Past Week   Ascorbic Acid (VITAMIN C PO) Take 500 mg by mouth daily.   Past Week   Calcium Carbonate-Vitamin D (CALCIUM 600+D PO) Take 1 tablet by mouth 2 (two) times daily.    Past Week   cetirizine (ZYRTEC) 10 MG tablet Take 10 mg by mouth daily.   Past Week   Cholecalciferol (VITAMIN D3) 5000 units CAPS Take 5,000 Units by mouth daily.   Past Week   EPINEPHrine (EPI-PEN) 0.3 mg/0.3 mL SOAJ injection Inject 0.3 mg into the muscle once.   Past Week   fluticasone (FLONASE) 50 MCG/ACT nasal spray Place 1 spray into both nostrils daily as needed for allergies.    Past Week   folic acid (FOLVITE) 1 MG tablet Take 1 mg by mouth daily.   Past Week   metoprolol tartrate (LOPRESSOR) 25 MG tablet Take 50 mg by mouth at bedtime.   Past Week   pantoprazole (PROTONIX) 40 MG tablet Take 40 mg by mouth daily.   Past Week   VITAMIN E PO Take 1 capsule by mouth daily.   07/28/2022     Allergies  Allergen Reactions   Pork-Derived Products Anaphylaxis   Penicillin V Potassium Hives, Rash and Other (See Comments)    Has patient had a PCN reaction causing immediate rash,  facial/tongue/throat swelling, SOB or lightheadedness with hypotension: Yes Has patient had a PCN reaction causing severe rash involving mucus membranes or skin necrosis: Yes Has patient had a PCN reaction that required hospitalization: No Has patient had a PCN reaction occurring within the last 10 years: No If all of the above answers are "NO", then may proceed with Cephalosporin use.    Bee Venom Swelling   Shellfish Allergy Hives and Swelling     Past Medical History:  Diagnosis Date   Allergic genetic state    Asthma    Cirrhosis ([place])    Dysrhythmia    GERD (gastroesophageal reflux disease)    Hypertension    Iron overload    Lumbago    Paroxysmal atrial tachycardia ([facility])    Tubular adenoma of colon     Review of systems:  Otherwise negative.    Physical Exam  Gen: Alert, oriented. Appears stated age.  HEENT: PERRLA. Lungs: No respiratory distress CV: RRR Abd: soft, benign, no masses Ext: No edema    Planned procedures: Proceed with colonoscopy. The patient understands the nature of the planned procedure, indications, risks, alternatives and potential complications including but not limited to bleeding, infection, perforation, damage to internal organs and possible oversedation/side effects from anesthesia. The patient agrees and gives consent to proceed.  Please refer to procedure notes for findings, recommendations and patient  disposition/instructions.     Traci Rubenstein, MD [facility]

## 2022-07-30 ENCOUNTER — Encounter: Payer: Self-pay | Admitting: Gastroenterology

## 2022-07-30 LAB — SURGICAL PATHOLOGY

## 2022-08-02 IMAGING — MG DIGITAL DIAGNOSTIC BILAT W/ TOMO W/ CAD
6 of 12 series · 6 of 36 positions shown · non-contrast
Comparison: Prior films

CLINICAL DATA: Right breast focal pain and palpable lump left
breast.



[L MLO synth-2D]
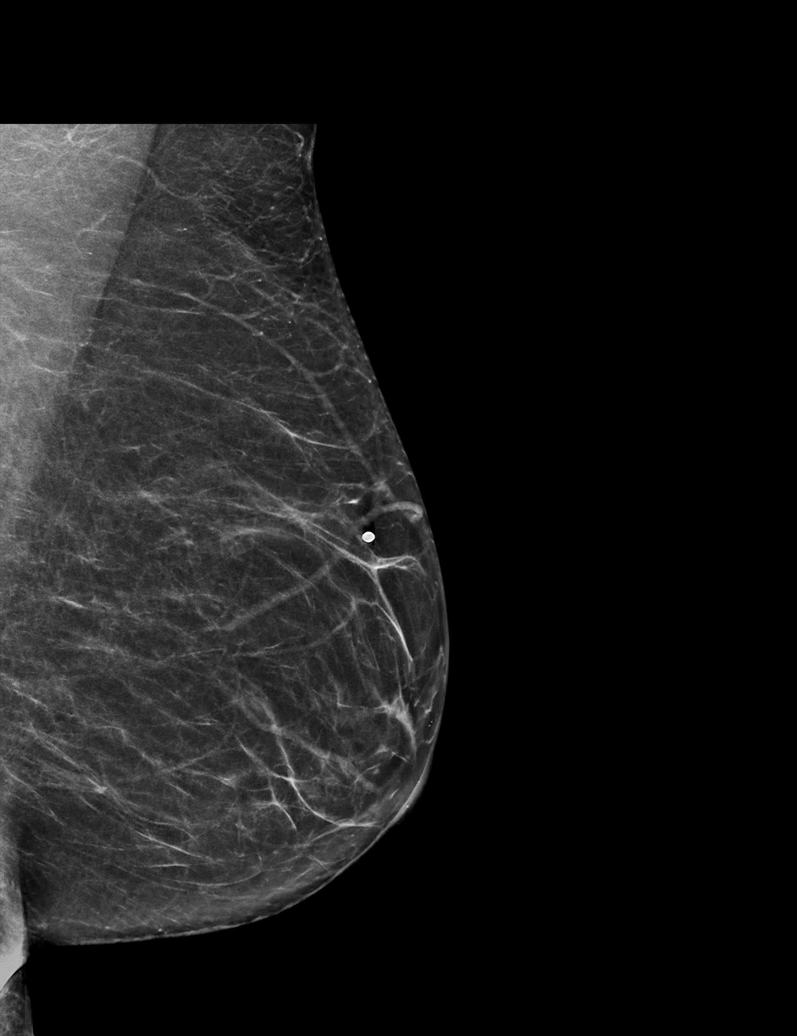

[L LM synth-2D]
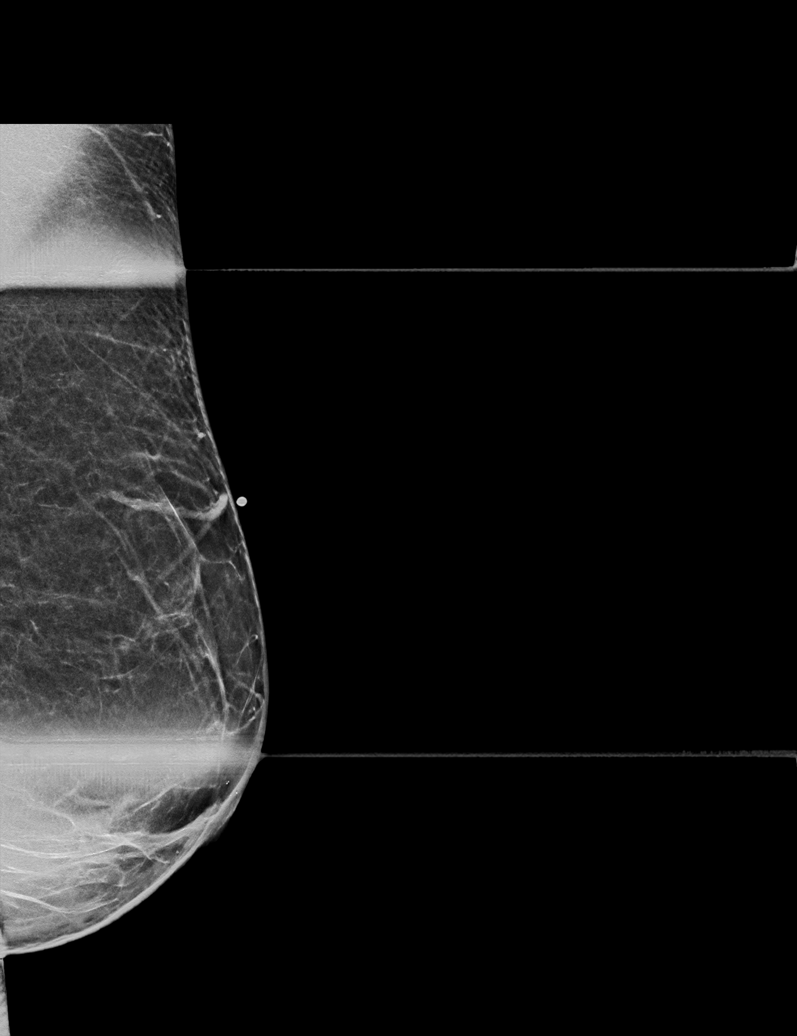

[R ML synth-2D]
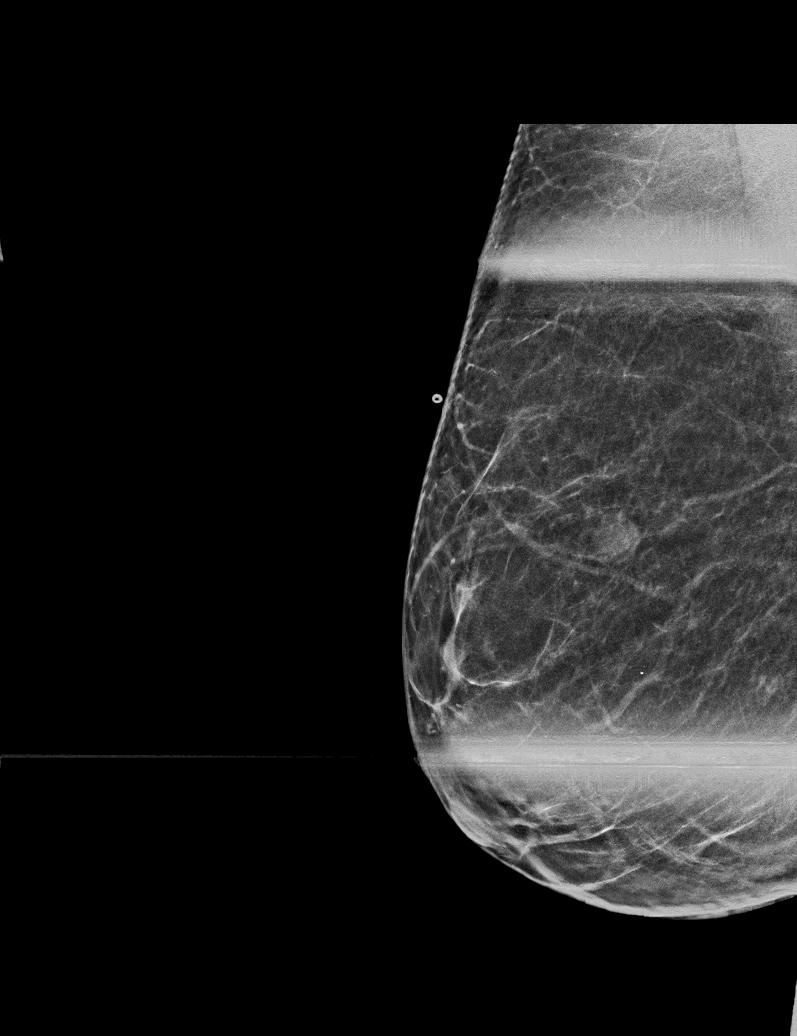

[L CC synth-2D]
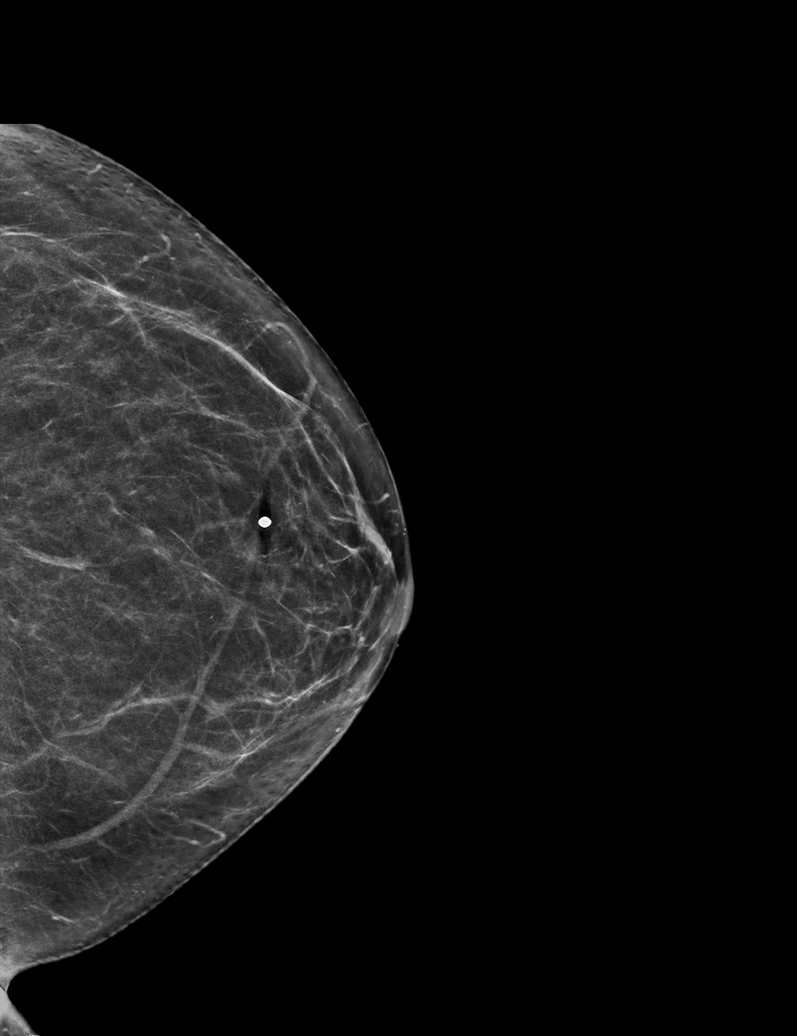

[R CC synth-2D]
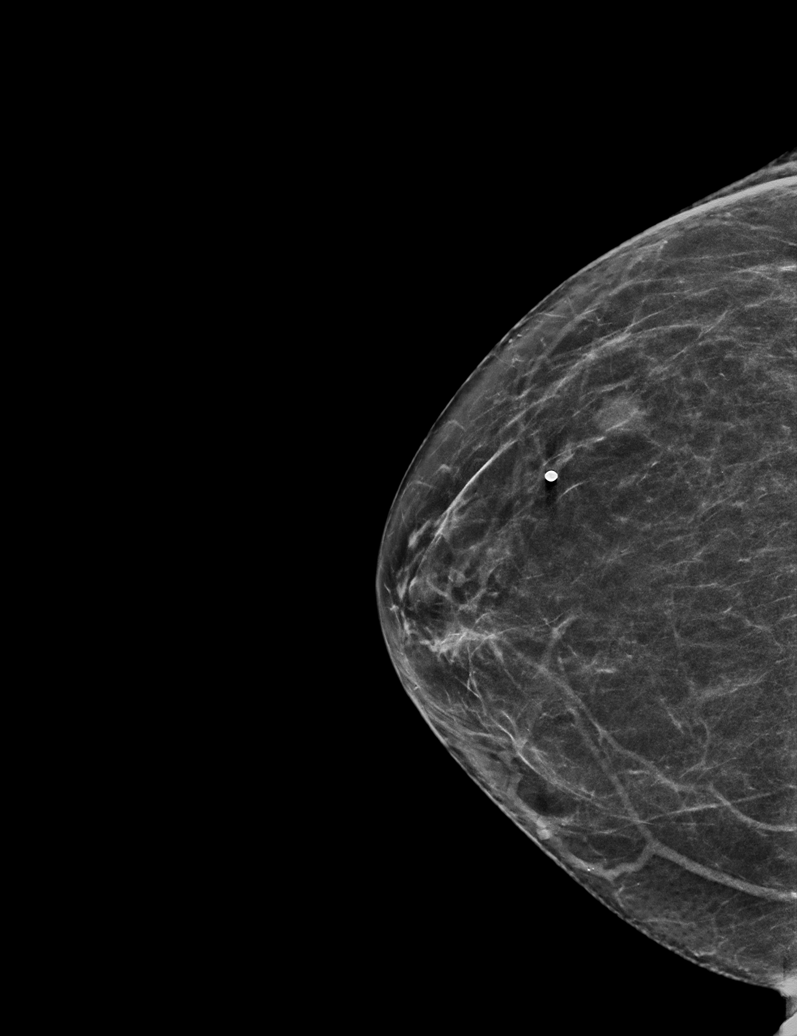

[R MLO synth-2D]
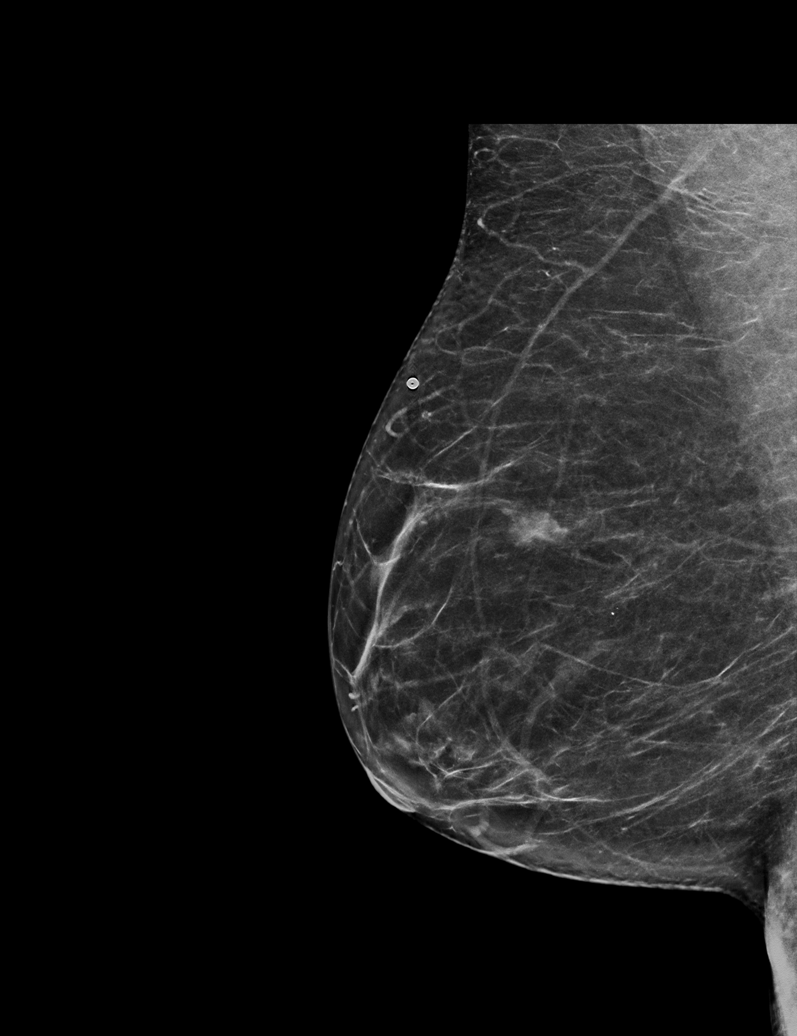

[6 of 36 positions shown; findings below may reference images not displayed]

ACR Breast Density Category b: There are scattered areas of
fibroglandular density.
FINDINGS: Cc and MLO views of bilateral breasts, spot tangential views of
bilateral breasts are submitted. Stable mass is identified in the
upper-outer quadrant right breast unchanged compared prior exam of
2399. No suspicious abnormalities identified bilaterally.

Targeted ultrasound is performed, showing no focal abnormal discrete
cystic or solid lesion at palpable area left breast 12:30 o'clock 5
cm from nipple.

Ultrasound the right breast demonstrate no focal abnormal discrete
or solid lesion at focal area pain 12 o'clock 6 cm from nipple.
IMPRESSION: Benign findings.

RECOMMENDATION:
Routine screening mammogram in 1 year.

I have discussed the findings and recommendations with the patient.
If applicable, a reminder letter will be sent to the patient
regarding the next appointment.

BI-RADS CATEGORY  2: Benign.

## 2022-08-02 IMAGING — US US BREAST*R* LIMITED INC AXILLA
1 series · 3 of 3 positions shown · non-contrast
Comparison: Prior films

CLINICAL DATA: Right breast focal pain and palpable lump left
breast.



[Series 1: us breast*right* limited inc axilla · 0.07mm/px · 3 of 3 slices shown]
[im 1/3]
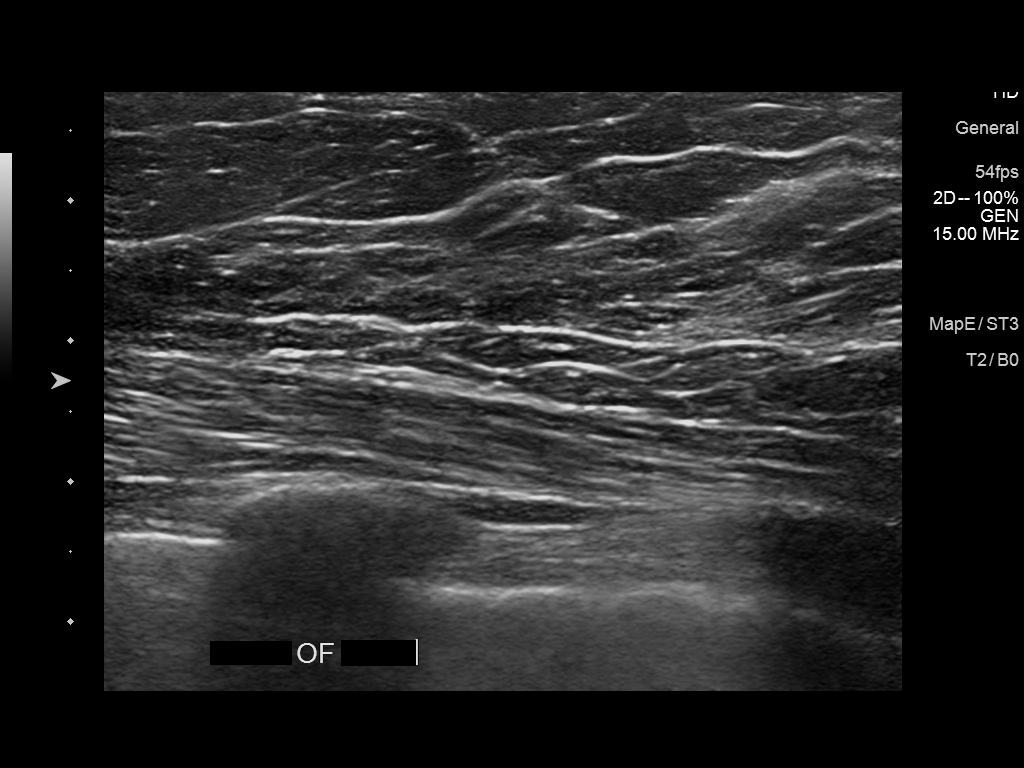
[im 2/3]
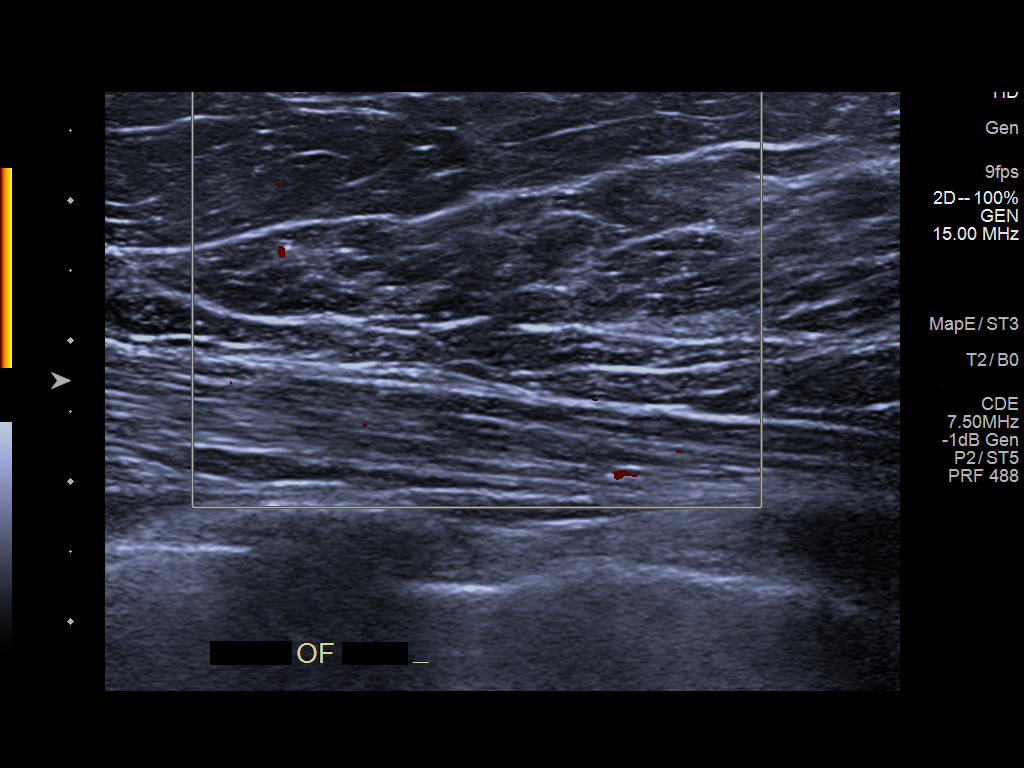
[im 3/3]
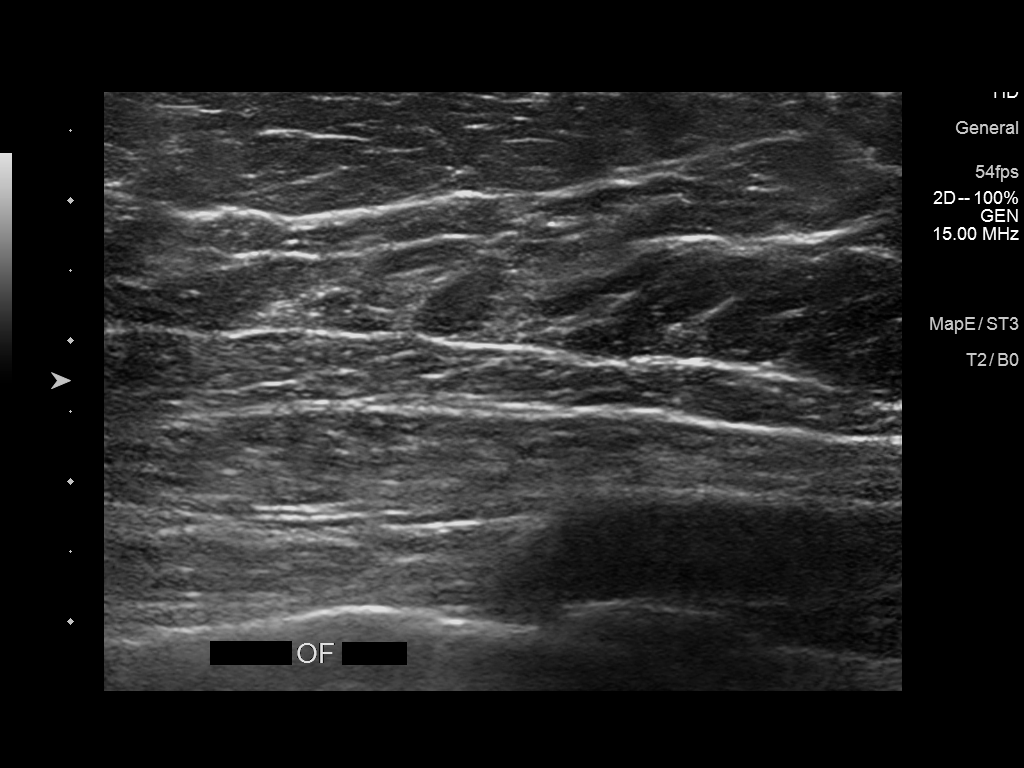

[3 of 3 positions shown; findings below may reference images not displayed]

ACR Breast Density Category b: There are scattered areas of
fibroglandular density.
FINDINGS: Cc and MLO views of bilateral breasts, spot tangential views of
bilateral breasts are submitted. Stable mass is identified in the
upper-outer quadrant right breast unchanged compared prior exam of
2399. No suspicious abnormalities identified bilaterally.

Targeted ultrasound is performed, showing no focal abnormal discrete
cystic or solid lesion at palpable area left breast 12:30 o'clock 5
cm from nipple.

Ultrasound the right breast demonstrate no focal abnormal discrete
or solid lesion at focal area pain 12 o'clock 6 cm from nipple.
IMPRESSION: Benign findings.

RECOMMENDATION:
Routine screening mammogram in 1 year.

I have discussed the findings and recommendations with the patient.
If applicable, a reminder letter will be sent to the patient
regarding the next appointment.

BI-RADS CATEGORY  2: Benign.

## 2022-08-02 IMAGING — US US BREAST*L* LIMITED INC AXILLA
1 series · 3 of 3 positions shown · non-contrast
Comparison: Prior films

CLINICAL DATA: Right breast focal pain and palpable lump left
breast.



[Series 1: us breast*left* limited inc axilla · 0.07mm/px · 3 of 3 slices shown]
[im 1/3]
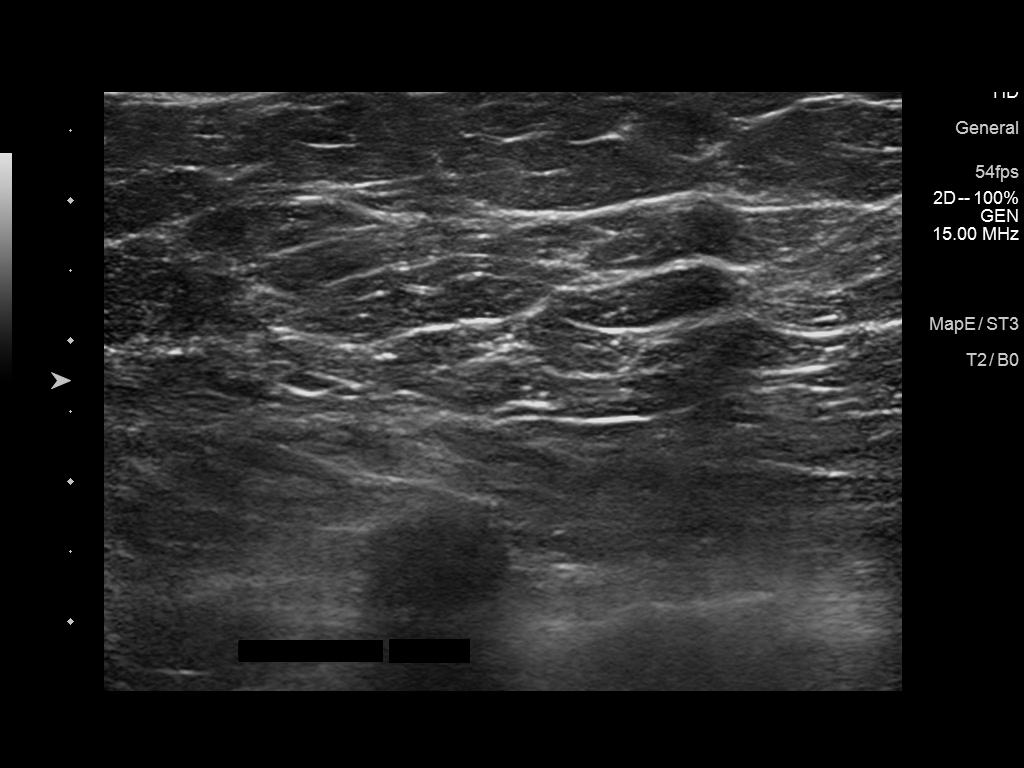
[im 2/3]
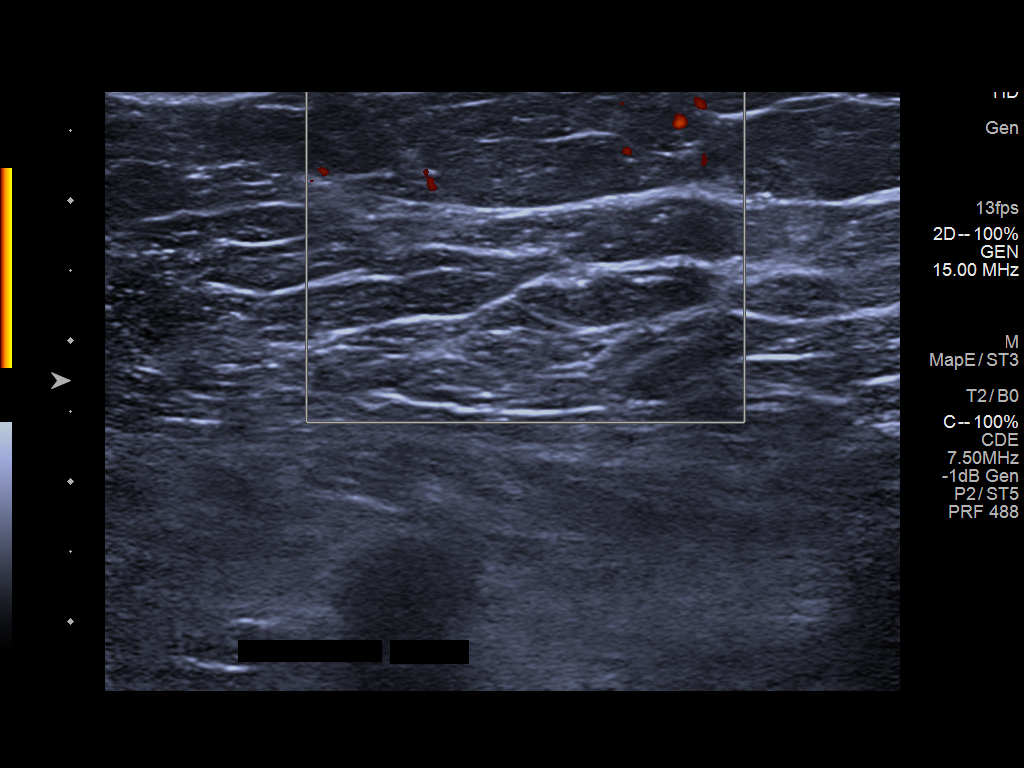
[im 3/3]
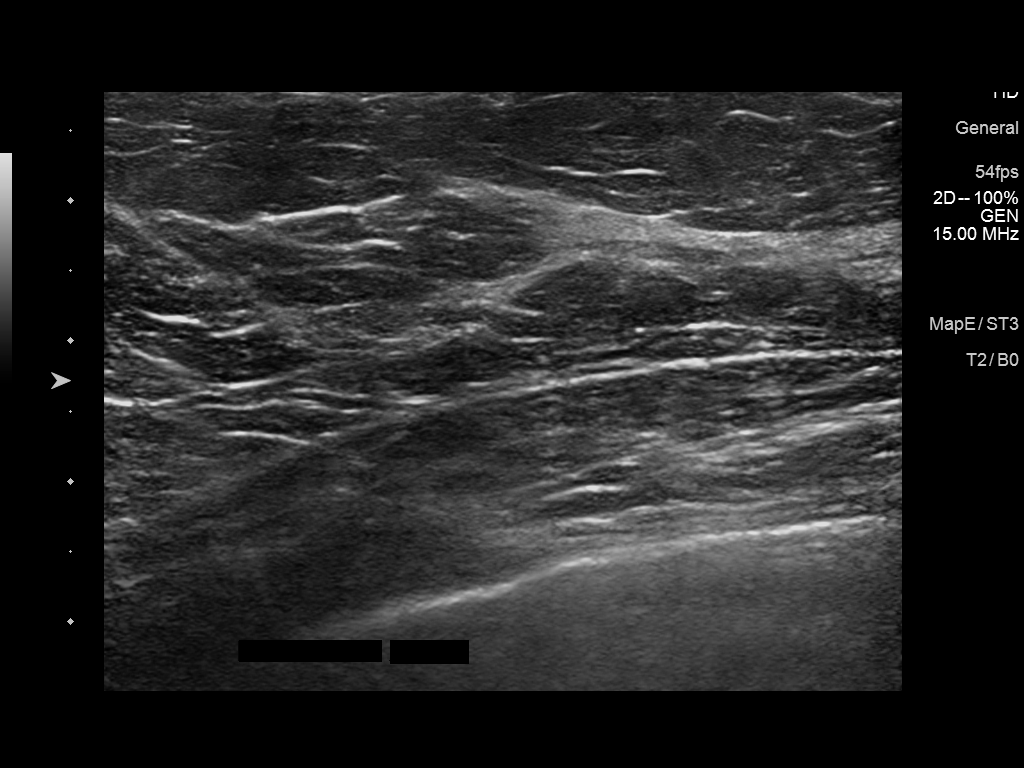

[3 of 3 positions shown; findings below may reference images not displayed]

ACR Breast Density Category b: There are scattered areas of
fibroglandular density.
FINDINGS: Cc and MLO views of bilateral breasts, spot tangential views of
bilateral breasts are submitted. Stable mass is identified in the
upper-outer quadrant right breast unchanged compared prior exam of
2399. No suspicious abnormalities identified bilaterally.

Targeted ultrasound is performed, showing no focal abnormal discrete
cystic or solid lesion at palpable area left breast 12:30 o'clock 5
cm from nipple.

Ultrasound the right breast demonstrate no focal abnormal discrete
or solid lesion at focal area pain 12 o'clock 6 cm from nipple.
IMPRESSION: Benign findings.

RECOMMENDATION:
Routine screening mammogram in 1 year.

I have discussed the findings and recommendations with the patient.
If applicable, a reminder letter will be sent to the patient
regarding the next appointment.

BI-RADS CATEGORY  2: Benign.

## 2022-08-07 DIAGNOSIS — R1011 Right upper quadrant pain: Secondary | ICD-10-CM | POA: Diagnosis not present

## 2022-08-07 DIAGNOSIS — K581 Irritable bowel syndrome with constipation: Secondary | ICD-10-CM | POA: Diagnosis not present

## 2022-08-07 DIAGNOSIS — K2100 Gastro-esophageal reflux disease with esophagitis, without bleeding: Secondary | ICD-10-CM | POA: Diagnosis not present

## 2022-08-07 DIAGNOSIS — K7460 Unspecified cirrhosis of liver: Secondary | ICD-10-CM | POA: Diagnosis not present

## 2022-08-15 DIAGNOSIS — Z0000 Encounter for general adult medical examination without abnormal findings: Secondary | ICD-10-CM | POA: Diagnosis not present

## 2022-08-15 DIAGNOSIS — D696 Thrombocytopenia, unspecified: Secondary | ICD-10-CM | POA: Diagnosis not present

## 2022-08-15 DIAGNOSIS — Z8679 Personal history of other diseases of the circulatory system: Secondary | ICD-10-CM | POA: Diagnosis not present

## 2022-08-18 DIAGNOSIS — M8589 Other specified disorders of bone density and structure, multiple sites: Secondary | ICD-10-CM | POA: Diagnosis not present

## 2022-08-18 DIAGNOSIS — R350 Frequency of micturition: Secondary | ICD-10-CM | POA: Diagnosis not present

## 2022-08-18 DIAGNOSIS — D696 Thrombocytopenia, unspecified: Secondary | ICD-10-CM | POA: Diagnosis not present

## 2022-09-03 ENCOUNTER — Ambulatory Visit
Admission: RE | Admit: 2022-09-03 | Discharge: 2022-09-03 | Disposition: A | Discharge status: Home / Self Care | Payer: Medicare HMO | Source: Ambulatory Visit | Attending: Family Medicine | Admitting: Family Medicine

## 2022-09-03 DIAGNOSIS — M1611 Unilateral primary osteoarthritis, right hip: Secondary | ICD-10-CM | POA: Diagnosis not present

## 2022-09-03 DIAGNOSIS — M25551 Pain in right hip: Secondary | ICD-10-CM | POA: Diagnosis not present

## 2022-09-09 DIAGNOSIS — D485 Neoplasm of uncertain behavior of skin: Secondary | ICD-10-CM | POA: Diagnosis not present

## 2022-09-09 DIAGNOSIS — L720 Epidermal cyst: Secondary | ICD-10-CM | POA: Diagnosis not present

## 2022-09-09 DIAGNOSIS — D225 Melanocytic nevi of trunk: Secondary | ICD-10-CM | POA: Diagnosis not present

## 2022-09-09 DIAGNOSIS — L578 Other skin changes due to chronic exposure to nonionizing radiation: Secondary | ICD-10-CM | POA: Diagnosis not present

## 2022-09-24 DIAGNOSIS — Z23 Encounter for immunization: Secondary | ICD-10-CM | POA: Diagnosis not present

## 2022-09-24 DIAGNOSIS — Z8679 Personal history of other diseases of the circulatory system: Secondary | ICD-10-CM | POA: Diagnosis not present

## 2022-09-26 DIAGNOSIS — H6122 Impacted cerumen, left ear: Secondary | ICD-10-CM | POA: Diagnosis not present

## 2022-09-26 DIAGNOSIS — H9202 Otalgia, left ear: Secondary | ICD-10-CM | POA: Diagnosis not present

## 2022-09-26 DIAGNOSIS — H903 Sensorineural hearing loss, bilateral: Secondary | ICD-10-CM | POA: Diagnosis not present

## 2022-09-29 DIAGNOSIS — K130 Diseases of lips: Secondary | ICD-10-CM | POA: Diagnosis not present

## 2022-10-15 DIAGNOSIS — R300 Dysuria: Secondary | ICD-10-CM | POA: Diagnosis not present

## 2022-10-15 DIAGNOSIS — R350 Frequency of micturition: Secondary | ICD-10-CM | POA: Diagnosis not present

## 2022-10-16 DIAGNOSIS — K219 Gastro-esophageal reflux disease without esophagitis: Secondary | ICD-10-CM | POA: Diagnosis not present

## 2022-10-16 DIAGNOSIS — M810 Age-related osteoporosis without current pathological fracture: Secondary | ICD-10-CM | POA: Diagnosis not present

## 2022-10-16 DIAGNOSIS — M8589 Other specified disorders of bone density and structure, multiple sites: Secondary | ICD-10-CM | POA: Diagnosis not present

## 2022-10-17 DIAGNOSIS — I480 Paroxysmal atrial fibrillation: Secondary | ICD-10-CM | POA: Diagnosis not present

## 2022-10-17 DIAGNOSIS — K5900 Constipation, unspecified: Secondary | ICD-10-CM | POA: Diagnosis not present

## 2022-10-17 DIAGNOSIS — R350 Frequency of micturition: Secondary | ICD-10-CM | POA: Diagnosis not present

## 2022-11-03 ENCOUNTER — Ambulatory Visit (LOCAL_COMMUNITY_HEALTH_CENTER): Payer: Medicare HMO

## 2022-11-03 DIAGNOSIS — Z23 Encounter for immunization: Secondary | ICD-10-CM

## 2022-11-03 DIAGNOSIS — Z719 Counseling, unspecified: Secondary | ICD-10-CM

## 2022-11-03 NOTE — Progress Notes (Signed)
Client requested Hep B vaccine per recommendation by Primary Care Provided. Flu vaccine offered and accepted today. VIS given for hep b and Flu vaccine.  Vaccine administered and tolerated well.  Copy of [facility] provided.  Date for next vaccine reviewed.  Juell Radney Shelda Pal, RN

## 2022-11-12 DIAGNOSIS — K219 Gastro-esophageal reflux disease without esophagitis: Secondary | ICD-10-CM | POA: Diagnosis not present

## 2022-11-12 DIAGNOSIS — M8589 Other specified disorders of bone density and structure, multiple sites: Secondary | ICD-10-CM | POA: Diagnosis not present

## 2022-11-12 DIAGNOSIS — M810 Age-related osteoporosis without current pathological fracture: Secondary | ICD-10-CM | POA: Diagnosis not present

## 2022-11-28 ENCOUNTER — Ambulatory Visit (LOCAL_COMMUNITY_HEALTH_CENTER): Payer: Medicare HMO

## 2022-11-28 DIAGNOSIS — Z7185 Encounter for immunization safety counseling: Secondary | ICD-10-CM

## 2022-11-28 NOTE — Progress Notes (Signed)
In nurse clinic for 2nd dose Heplisav B. First dose given 11/03/2022. 2nd dose needs to be at least 4 weeks from first dose and it is too early to administer 2nd dose today. Earliest date it can be given is 12/01/2022 which is a holiday and [facility] is closed.   Patient scheduled 12/04/2022 at 8 am. Pt is agreeable with appt. Josie Saunders, RN

## 2022-12-04 ENCOUNTER — Ambulatory Visit (LOCAL_COMMUNITY_HEALTH_CENTER): Payer: Medicare HMO

## 2022-12-04 DIAGNOSIS — Z719 Counseling, unspecified: Secondary | ICD-10-CM

## 2022-12-04 DIAGNOSIS — Z23 Encounter for immunization: Secondary | ICD-10-CM | POA: Diagnosis not present

## 2022-12-04 NOTE — Progress Notes (Signed)
Client seen for Hep B vaccine. ViS provided. Vaccine administered and tolerated well.  Copy of [facility] provided.   .me

## 2022-12-09 ENCOUNTER — Other Ambulatory Visit (HOSPITAL_BASED_OUTPATIENT_CLINIC_OR_DEPARTMENT_OTHER): Payer: Self-pay | Admitting: Gastroenterology

## 2022-12-09 DIAGNOSIS — K7402 Hepatic fibrosis, advanced fibrosis: Secondary | ICD-10-CM

## 2022-12-09 DIAGNOSIS — K219 Gastro-esophageal reflux disease without esophagitis: Secondary | ICD-10-CM | POA: Diagnosis not present

## 2022-12-09 DIAGNOSIS — R1011 Right upper quadrant pain: Secondary | ICD-10-CM | POA: Diagnosis not present

## 2022-12-09 DIAGNOSIS — R140 Abdominal distension (gaseous): Secondary | ICD-10-CM | POA: Diagnosis not present

## 2022-12-09 DIAGNOSIS — K5900 Constipation, unspecified: Secondary | ICD-10-CM | POA: Diagnosis not present

## 2022-12-10 DIAGNOSIS — M810 Age-related osteoporosis without current pathological fracture: Secondary | ICD-10-CM | POA: Diagnosis not present

## 2022-12-10 DIAGNOSIS — M8589 Other specified disorders of bone density and structure, multiple sites: Secondary | ICD-10-CM | POA: Diagnosis not present

## 2022-12-10 DIAGNOSIS — K219 Gastro-esophageal reflux disease without esophagitis: Secondary | ICD-10-CM | POA: Diagnosis not present

## 2022-12-15 DIAGNOSIS — R109 Unspecified abdominal pain: Secondary | ICD-10-CM | POA: Diagnosis not present

## 2022-12-15 DIAGNOSIS — R140 Abdominal distension (gaseous): Secondary | ICD-10-CM | POA: Diagnosis not present

## 2022-12-17 ENCOUNTER — Ambulatory Visit
Admission: RE | Admit: 2022-12-17 | Discharge: 2022-12-17 | Disposition: A | Discharge status: Home / Self Care | Payer: Medicare HMO | Source: Ambulatory Visit | Attending: Gastroenterology | Admitting: Gastroenterology

## 2022-12-17 DIAGNOSIS — K7402 Hepatic fibrosis, advanced fibrosis: Secondary | ICD-10-CM

## 2022-12-17 DIAGNOSIS — K8020 Calculus of gallbladder without cholecystitis without obstruction: Secondary | ICD-10-CM | POA: Diagnosis not present

## 2022-12-17 DIAGNOSIS — N281 Cyst of kidney, acquired: Secondary | ICD-10-CM | POA: Diagnosis not present

## 2023-01-01 DIAGNOSIS — R202 Paresthesia of skin: Secondary | ICD-10-CM | POA: Diagnosis not present

## 2023-01-05 DIAGNOSIS — M47816 Spondylosis without myelopathy or radiculopathy, lumbar region: Secondary | ICD-10-CM | POA: Diagnosis not present

## 2023-01-05 DIAGNOSIS — M5416 Radiculopathy, lumbar region: Secondary | ICD-10-CM | POA: Diagnosis not present

## 2023-01-05 DIAGNOSIS — M48062 Spinal stenosis, lumbar region with neurogenic claudication: Secondary | ICD-10-CM | POA: Diagnosis not present

## 2023-01-05 DIAGNOSIS — M5136 Other intervertebral disc degeneration, lumbar region: Secondary | ICD-10-CM | POA: Diagnosis not present

## 2023-01-05 DIAGNOSIS — D696 Thrombocytopenia, unspecified: Secondary | ICD-10-CM | POA: Diagnosis not present

## 2023-01-19 ENCOUNTER — Other Ambulatory Visit: Payer: Self-pay | Admitting: Family Medicine

## 2023-01-19 ENCOUNTER — Ambulatory Visit
Admission: RE | Admit: 2023-01-19 | Discharge: 2023-01-19 | Disposition: A | Discharge status: Home / Self Care | Payer: Medicare HMO | Source: Ambulatory Visit | Attending: Family Medicine | Admitting: Family Medicine

## 2023-01-19 ENCOUNTER — Encounter: Payer: Self-pay | Admitting: Family Medicine

## 2023-01-19 DIAGNOSIS — R1011 Right upper quadrant pain: Secondary | ICD-10-CM | POA: Insufficient documentation

## 2023-01-19 DIAGNOSIS — D696 Thrombocytopenia, unspecified: Secondary | ICD-10-CM | POA: Diagnosis not present

## 2023-01-19 DIAGNOSIS — K8020 Calculus of gallbladder without cholecystitis without obstruction: Secondary | ICD-10-CM | POA: Diagnosis not present

## 2023-01-21 DIAGNOSIS — K219 Gastro-esophageal reflux disease without esophagitis: Secondary | ICD-10-CM | POA: Diagnosis not present

## 2023-01-21 DIAGNOSIS — M8589 Other specified disorders of bone density and structure, multiple sites: Secondary | ICD-10-CM | POA: Diagnosis not present

## 2023-01-21 DIAGNOSIS — D696 Thrombocytopenia, unspecified: Secondary | ICD-10-CM | POA: Diagnosis not present

## 2023-01-21 DIAGNOSIS — M810 Age-related osteoporosis without current pathological fracture: Secondary | ICD-10-CM | POA: Diagnosis not present

## 2023-01-22 DIAGNOSIS — M47816 Spondylosis without myelopathy or radiculopathy, lumbar region: Secondary | ICD-10-CM | POA: Diagnosis not present

## 2023-01-27 DIAGNOSIS — Z9889 Other specified postprocedural states: Secondary | ICD-10-CM | POA: Diagnosis not present

## 2023-01-27 DIAGNOSIS — Z961 Presence of intraocular lens: Secondary | ICD-10-CM | POA: Diagnosis not present

## 2023-01-27 DIAGNOSIS — Z0100 Encounter for examination of eyes and vision without abnormal findings: Secondary | ICD-10-CM | POA: Diagnosis not present

## 2023-01-29 ENCOUNTER — Ambulatory Visit: Payer: Self-pay | Admitting: General Surgery

## 2023-01-29 DIAGNOSIS — K8020 Calculus of gallbladder without cholecystitis without obstruction: Secondary | ICD-10-CM | POA: Diagnosis not present

## 2023-01-29 DIAGNOSIS — K7460 Unspecified cirrhosis of liver: Secondary | ICD-10-CM | POA: Diagnosis not present

## 2023-01-29 DIAGNOSIS — K766 Portal hypertension: Secondary | ICD-10-CM | POA: Diagnosis not present

## 2023-01-29 NOTE — H&P (Signed)
PATIENT PROFILE: Traci Lynn is a 72 y.o. female who presents to the Clinic for consultation at the request of Dr. Netty Starring for evaluation of cholelithiasis.  PCP:  Dion Body, MD  HISTORY OF PRESENT ILLNESS: [name] reports she has been having right upper quadrant pain for the last few months.  She endorses that the pain is localized to the right upper quadrant.  Pain aggravated by eating fatty food.  Pain radiates to her back.  There has been no alleviating factor.  Patient denies any fever or chills.  She has been evaluated by her primary care physician and gastroenterology.  She had an ultrasound of the abdomen that shows gallstone without any sign of cholecystitis.  No other pathology has been identified.  She also had a CT scan of the abdomen pelvis without any other pathologies.  She does have cirrhotic changes of the liver.  She also has splenomegaly.  Upon review of labs she has thrombocytopenia, elevated alkaline phosphatase, elevated GGT.  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  Her bilirubin is within normal level, her albumin is within normal level there is no sign of ascites on the CT scan or physical exam.  There is no encephalopathy.  Patient completely alert, oriented x 3.   PROBLEM LIST: Problem List  Date Reviewed: 01/22/2023          Noted   Gastroesophageal reflux disease without esophagitis - followed by [facility] 08/18/2022   Lumbar radiculopathy, chronic 06/26/2021   Thrombocytopenia (Plt 103 - 12/09/22) - followed by Dr. Janese Banks 09/24/2020   Medicare annual wellness visit, subsequent 12/27/21 03/17/2019   Seasonal allergies 03/17/2019   Medicare annual wellness visit, initial 09/02/17 09/02/2017   Osteopenia of multiple sites (Dexa 02/10/22 - repeat 2 yrs) 10/07/2016   History of paroxysmal atrial tachycardia - followed by Dr. Saralyn Pilar Unknown    GENERAL REVIEW OF SYSTEMS:   General ROS: negative for - chills, fatigue, fever,  weight gain or weight loss Allergy and Immunology ROS: negative for - hives  Hematological and Lymphatic ROS: negative for - bleeding problems or bruising, negative for palpable nodes Endocrine ROS: negative for - heat or cold intolerance, hair changes Respiratory ROS: negative for - cough, shortness of breath or wheezing Cardiovascular ROS: no chest pain or palpitations GI ROS: negative for nausea, vomiting, diarrhea, constipation.  Positive for abdominal pain Musculoskeletal ROS: negative for - joint swelling or muscle pain Neurological ROS: negative for - confusion, syncope Dermatological ROS: negative for pruritus and rash Psychiatric: negative for anxiety, depression, difficulty sleeping and memory loss  MEDICATIONS: Current Outpatient Medications  Medication Sig Dispense Refill   albuterol 90 mcg/actuation inhaler Inhale 2 inhalations into the lungs every 4 (four) hours as needed for Wheezing 3 Inhaler 1   ascorbic acid (VITAMIN C) 250 MG tablet Take 250 mg by mouth once daily.     baclofen (LIORESAL) 10 MG tablet Take 1 tablet (10 mg total) by mouth at bedtime as needed for up to 180 days 30 tablet 5   calcium carbonate-vitamin D3 (CALTRATE 600+D) 600 mg(1,586m) -400 unit tablet Take 1 tablet by mouth 2 (two) times daily.     cetirizine (ZYRTEC) 10 MG tablet Take 10 mg by mouth once daily     cholecalciferol (VITAMIN D3) 2,000 unit capsule Take 2,000 Units by mouth once daily.     EPINEPHrine (EPIPEN) 0.3 mg/0.3 mL pen injector Inject 0.3 mg into the muscle once as needed for Anaphylaxis.  fluticasone propionate (FLONASE) 50 mcg/actuation nasal spray PLACE 2 SPRAYS INTO BOTH NOSTRILS ONCE DAILY AS NEEDED FOR RHINITIS 48 g 1   metoprolol tartrate (LOPRESSOR) 25 MG tablet TAKE 1 TABLET TWICE DAILY 180 tablet 1   pantoprazole (PROTONIX) 40 MG DR tablet Take 1 tablet (40 mg total) by mouth once daily 30 tablet 11   traMADoL (ULTRAM) 50 mg tablet Take 1 tablet (50 mg total) by mouth  every 6 (six) hours as needed for Pain 1 po bid prn 20 tablet 0   No current facility-administered medications for this visit.    ALLERGIES: Poractant alfa, Pork/porcine containing products, Shellfish containing products, Bee venom protein (honey bee), Penicillin v potassium, and Penicillins  PAST MEDICAL HISTORY: Past Medical History:  Diagnosis Date   Allergic state    GERD (gastroesophageal reflux disease)    History of chicken pox    History of paroxysmal atrial tachycardia    Hypertension    Tubular adenoma    History of    PAST SURGICAL HISTORY: Past Surgical History:  Procedure Laterality Date   COLONOSCOPY  10/09/2017   Diverticulosis/PHx CP 5 yr. MUS    EGD  10/09/2017   Gastritis/Gastric ulcer repeat 2 months [facility] (Scheduled for 12/22/2017)   EGD  12/22/2017   Barrett's Esophagus/Gastritis/Repeat 36yr/MUS   R knee athroscpy, partial medial mensiectomy (Complex tear with both radial and horizontal componets at the posterior horn/body junction involving approximately 75% of the meniscus width, R knee arthroscopic partial synovectomy with medial  Right 08/19/2019   Dr. PPosey Pronto  EGD  05/23/2020   Barrett's Esophagus/Repeat 579yrTKT   COLONOSCOPY  03/27/2022   Tubular adenoma/PHx CP   EGD  03/27/2022   Normal EGD/Cirrhosis   EGD  03/27/2022   Cirrhosis/Repeat 3 years/[facility]   [facility]   Tubular adenoma/PHx CP/Repeat 3y10yrTL   CATARACT EXTRACTION Bilateral    Done on 04/15/17 & 05/13/17   ENDOSCOPIC CARPAL TUNNEL RELEASE Left    HYSTERECTOMY     TEMPOROMANDIBULAR JOINT ARTHROPLASTY     TONSILLECTOMY       FAMILY HISTORY: Family History  Problem Relation Age of Onset   Colon cancer Mother    Heart failure Mother    Asthma Mother    Alzheimer's disease Mother    Diabetes type II Father    Kidney cancer Father    Colon cancer Father        Kidney cancer     SOCIAL HISTORY: Social History   Socioeconomic History   Marital status: Married   Tobacco Use   Smoking status: Former    Packs/day: 1.00    Years: 2.00    Additional pack years: 0.00    Total pack years: 2.00    Types: Cigarettes    Quit date: 12/08/1972    Years since quitting: 50.1    Passive exposure: Past   Smokeless tobacco: Never  Vaping Use   Vaping Use: Never used  Substance and Sexual Activity   Alcohol use: Never    Alcohol/week: 0.0 standard drinks of alcohol   Drug use: No   Sexual activity: Yes    Partners: Male    Birth control/protection: None    PHYSICAL EXAM: Vitals:   01/29/23 1026  BP: (!) 146/80  Pulse: 73   Body mass index is 26.79 kg/m. Weight: 75.3 kg (166 lb)   GENERAL: Alert, active, oriented x3  HEENT: Pupils equal reactive to light. Extraocular movements are intact. Sclera clear. Palpebral conjunctiva normal  red color.Pharynx clear.  NECK: Supple with no palpable mass and no adenopathy.  LUNGS: Sound clear with no rales rhonchi or wheezes.  HEART: Regular rhythm S1 and S2 without murmur.  ABDOMEN: Soft and depressible, nontender with no palpable mass, no hepatomegaly.   EXTREMITIES: Well-developed well-nourished symmetrical with no dependent edema.  NEUROLOGICAL: Awake alert oriented, facial expression symmetrical, moving all extremities.  REVIEW OF DATA: I have reviewed the following data today: Initial consult on 01/29/2023  Component Date Value   Glucose 01/29/2023 134 (H)    Sodium 01/29/2023 142    Potassium 01/29/2023 4.1    Chloride 01/29/2023 106    Carbon Dioxide (CO2) 01/29/2023 31.9    Urea Nitrogen (BUN) 01/29/2023 12    Creatinine 01/29/2023 0.7    Glomerular Filtration Ra* 01/29/2023 92    Calcium 01/29/2023 9.8    AST  01/29/2023 26    ALT  01/29/2023 14    Alk Phos (alkaline Phosp* 01/29/2023 195 (H)    Albumin 01/29/2023 4.1    Bilirubin, Total 01/29/2023 0.8    Protein, Total 01/29/2023 6.3    A/G Ratio 01/29/2023 1.9    WBC (White Blood Cell Co* 01/29/2023 5.8    RBC (Red Blood Cell  Coun* 01/29/2023 4.02 (L)    Hemoglobin 01/29/2023 13.0    Hematocrit 01/29/2023 37.3    MCV (Mean Corpuscular Vo* 01/29/2023 92.8    MCH (Mean Corpuscular He* 01/29/2023 32.3 (H)    MCHC (Mean Corpuscular H* 01/29/2023 34.9    Platelet Count 01/29/2023 86 (L)    RDW-CV (Red Cell Distrib* 01/29/2023 13.4    MPV (Mean Platelet Volum* 01/29/2023 10.0    Neutrophils 01/29/2023 3.06    Lymphocytes 01/29/2023 1.82    Monocytes 01/29/2023 0.46    Eosinophils 01/29/2023 0.38    Basophils 01/29/2023 0.04    Neutrophil % 01/29/2023 53.0    Lymphocyte % 01/29/2023 31.5    Monocyte % 01/29/2023 8.0    Eosinophil % 01/29/2023 6.6 (H)    Basophil% 01/29/2023 0.7    Immature Granulocyte % 01/29/2023 0.2    Immature Granulocyte Cou* 01/29/2023 0.01    Prothrombin Time 01/29/2023 13.6 (H)    Prothrombin INR 01/29/2023 1.2 (L)   Appointment on 01/21/2023  Component Date Value   Platelet Count 01/21/2023 87 (L)   Office Visit on 01/19/2023  Component Date Value   WBC (White Blood Cell Co* 01/19/2023 5.7    RBC (Red Blood Cell Coun* 01/19/2023 4.02 (L)    Hemoglobin 01/19/2023 12.9    Hematocrit 01/19/2023 37.2    MCV (Mean Corpuscular Vo* 01/19/2023 92.5    MCH (Mean Corpuscular He* 01/19/2023 32.1 (H)    MCHC (Mean Corpuscular H* 01/19/2023 34.7    Platelet Count 01/19/2023 86 (L)    RDW-CV (Red Cell Distrib* 01/19/2023 13.2    MPV (Mean Platelet Volum* 01/19/2023 10.5    Neutrophils 01/19/2023 3.41    Lymphocytes 01/19/2023 1.49    Monocytes 01/19/2023 0.43    Eosinophils 01/19/2023 0.33    Basophils 01/19/2023 0.03    Neutrophil % 01/19/2023 59.9    Lymphocyte % 01/19/2023 26.1    Monocyte % 01/19/2023 7.5    Eosinophil % 01/19/2023 5.8 (H)    Basophil% 01/19/2023 0.5    Immature Granulocyte % 01/19/2023 0.2    Immature Granulocyte Cou* 01/19/2023 0.01    Glucose 01/19/2023 94    Sodium 01/19/2023 141    Potassium 01/19/2023 4.1    Chloride 01/19/2023 108  Carbon Dioxide  (CO2) 01/19/2023 29.7    Urea Nitrogen (BUN) 01/19/2023 9    Creatinine 01/19/2023 0.6    Glomerular Filtration Ra* 01/19/2023 95    Calcium 01/19/2023 9.5    AST  01/19/2023 27    ALT  01/19/2023 13    Alk Phos (alkaline Phosp* 01/19/2023 202 (H)    Albumin 01/19/2023 4.0    Bilirubin, Total 01/19/2023 0.8    Protein, Total 01/19/2023 6.1    A/G Ratio 01/19/2023 1.9    Gamma Glutamyl Transfera* 01/19/2023 249 (H)   Office Visit on 12/09/2022  Component Date Value   WBC (White Blood Cell Co* 12/09/2022 5.5    RBC (Red Blood Cell Coun* 12/09/2022 4.14    Hemoglobin 12/09/2022 13.4    Hematocrit 12/09/2022 38.2    MCV (Mean Corpuscular Vo* 12/09/2022 92.3    MCH (Mean Corpuscular He* 12/09/2022 32.4 (H)    MCHC (Mean Corpuscular H* 12/09/2022 35.1    Platelet Count 12/09/2022 103 (L)    RDW-CV (Red Cell Distrib* 12/09/2022 13.2    MPV (Mean Platelet Volum* 12/09/2022 10.7    Neutrophils 12/09/2022 2.90    Lymphocytes 12/09/2022 1.98    Monocytes 12/09/2022 0.39    Eosinophils 12/09/2022 0.22    Basophils 12/09/2022 0.02    Neutrophil % 12/09/2022 52.4    Lymphocyte % 12/09/2022 35.9    Monocyte % 12/09/2022 7.1    Eosinophil % 12/09/2022 4.0    Basophil% 12/09/2022 0.4    Immature Granulocyte % 12/09/2022 0.2    Immature Granulocyte Cou* 12/09/2022 0.01    AFP, Serum, Tumor Marker* 12/09/2022 5.6    Prothrombin Time 12/09/2022 13.6 (H)    Prothrombin INR 12/09/2022 1.2 (L)    Protein, Total 12/09/2022 6.5    Albumin 12/09/2022 4.2    Bilirubin, Total 12/09/2022 0.6    Bilirubin, Conjugated 12/09/2022 0.19    Alk Phos (alkaline Phosp* 12/09/2022 187 (H)    AST  12/09/2022 29    ALT  12/09/2022 15    Glucose 12/09/2022 93    Sodium 12/09/2022 142    Potassium 12/09/2022 3.8    Chloride 12/09/2022 107    Carbon Dioxide (CO2) 12/09/2022 27.7    Calcium 12/09/2022 9.7    Urea Nitrogen (BUN) 12/09/2022 7    Creatinine 12/09/2022 0.6    Glomerular Filtration Ra*  12/09/2022 96    BUN/Crea Ratio 12/09/2022 11.7    Anion Gap w/K 12/09/2022 11.1      ASSESSMENT: Ms. Brassell is a 72 y.o. female presenting for consultation for cholelithiasis.    Patient was oriented about the diagnosis of cholelithiasis. Also oriented about what is the gallbladder, its anatomy and function and the implications of having stones. The patient was oriented about the treatment alternatives (observation vs cholecystectomy). Patient was oriented that a low percentage of patient will continue to have similar pain symptoms even after the gallbladder is removed. Surgical technique (open vs laparoscopic) was discussed. It was also discussed the goals of the surgery (decrease the pain episodes and avoid the risk of cholecystitis) and the risk of surgery including: bleeding, infection, common bile duct injury, stone retention, injury to other organs such as bowel, liver, stomach, other complications such as hernia, bowel obstruction among others. Also discussed with patient about anesthesia and its complications such as: reaction to medications, pneumonia, heart complications, death, among others.   I discussed with patient that she is high risk for surgery due to her portal hypertension.  Her Child-Pugh score  with labs done today is 5 point.  The mortality for abdominal surgery perioperative is 10%.  I discussed with patient higher rates of progression of her liver disease due to anesthesia.  Due to the patient constant pain in the right upper quadrant that is getting daily with each meals she feels that she would like to proceed with surgery despite her higher risk for complication or mortality.  The patient reports she understood all the risk and agreed to proceed with surgery.  Cholelithiasis without cholecystitis [K80.20]  PLAN: 1.  Robotic assisted laparoscopic cholecystectomy [id number]) 2.  CBC, CMP, INR 3.  Do not take aspirin 5 days before the procedure 4.  Contact us if has any  question or concern.   Patient and her husband verbalized understanding, all questions were answered, and were agreeable with the plan outlined above.   Herbert Pun, MD  Electronically signed by Herbert Pun, MD

## 2023-01-29 NOTE — H&P (View-Only) (Signed)
PATIENT PROFILE: Traci Lynn is a 72 y.o. female who presents to the Clinic for consultation at the request of Dr. Netty Starring for evaluation of cholelithiasis.  PCP:  Dion Body, MD  HISTORY OF PRESENT ILLNESS: [name] reports she has been having right upper quadrant pain for the last few months.  She endorses that the pain is localized to the right upper quadrant.  Pain aggravated by eating fatty food.  Pain radiates to her back.  There has been no alleviating factor.  Patient denies any fever or chills.  She has been evaluated by her primary care physician and gastroenterology.  She had an ultrasound of the abdomen that shows gallstone without any sign of cholecystitis.  No other pathology has been identified.  She also had a CT scan of the abdomen pelvis without any other pathologies.  She does have cirrhotic changes of the liver.  She also has splenomegaly.  Upon review of labs she has thrombocytopenia, elevated alkaline phosphatase, elevated GGT.  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  Her bilirubin is within normal level, her albumin is within normal level there is no sign of ascites on the CT scan or physical exam.  There is no encephalopathy.  Patient completely alert, oriented x 3.   PROBLEM LIST: Problem List  Date Reviewed: 01/22/2023          Noted   Gastroesophageal reflux disease without esophagitis - followed by [facility] 08/18/2022   Lumbar radiculopathy, chronic 06/26/2021   Thrombocytopenia (Plt 103 - 12/09/22) - followed by Dr. Janese Banks 09/24/2020   Medicare annual wellness visit, subsequent 12/27/21 03/17/2019   Seasonal allergies 03/17/2019   Medicare annual wellness visit, initial 09/02/17 09/02/2017   Osteopenia of multiple sites (Dexa 02/10/22 - repeat 2 yrs) 10/07/2016   History of paroxysmal atrial tachycardia - followed by Dr. Saralyn Pilar Unknown    GENERAL REVIEW OF SYSTEMS:   General ROS: negative for - chills, fatigue, fever,  weight gain or weight loss Allergy and Immunology ROS: negative for - hives  Hematological and Lymphatic ROS: negative for - bleeding problems or bruising, negative for palpable nodes Endocrine ROS: negative for - heat or cold intolerance, hair changes Respiratory ROS: negative for - cough, shortness of breath or wheezing Cardiovascular ROS: no chest pain or palpitations GI ROS: negative for nausea, vomiting, diarrhea, constipation.  Positive for abdominal pain Musculoskeletal ROS: negative for - joint swelling or muscle pain Neurological ROS: negative for - confusion, syncope Dermatological ROS: negative for pruritus and rash Psychiatric: negative for anxiety, depression, difficulty sleeping and memory loss  MEDICATIONS: Current Outpatient Medications  Medication Sig Dispense Refill   albuterol 90 mcg/actuation inhaler Inhale 2 inhalations into the lungs every 4 (four) hours as needed for Wheezing 3 Inhaler 1   ascorbic acid (VITAMIN C) 250 MG tablet Take 250 mg by mouth once daily.     baclofen (LIORESAL) 10 MG tablet Take 1 tablet (10 mg total) by mouth at bedtime as needed for up to 180 days 30 tablet 5   calcium carbonate-vitamin D3 (CALTRATE 600+D) 600 mg(1,'500mg'$ ) -400 unit tablet Take 1 tablet by mouth 2 (two) times daily.     cetirizine (ZYRTEC) 10 MG tablet Take 10 mg by mouth once daily     cholecalciferol (VITAMIN D3) 2,000 unit capsule Take 2,000 Units by mouth once daily.     EPINEPHrine (EPIPEN) 0.3 mg/0.3 mL pen injector Inject 0.3 mg into the muscle once as needed for Anaphylaxis.  fluticasone propionate (FLONASE) 50 mcg/actuation nasal spray PLACE 2 SPRAYS INTO BOTH NOSTRILS ONCE DAILY AS NEEDED FOR RHINITIS 48 g 1   metoprolol tartrate (LOPRESSOR) 25 MG tablet TAKE 1 TABLET TWICE DAILY 180 tablet 1   pantoprazole (PROTONIX) 40 MG DR tablet Take 1 tablet (40 mg total) by mouth once daily 30 tablet 11   traMADoL (ULTRAM) 50 mg tablet Take 1 tablet (50 mg total) by mouth  every 6 (six) hours as needed for Pain 1 po bid prn 20 tablet 0   No current facility-administered medications for this visit.    ALLERGIES: Poractant alfa, Pork/porcine containing products, Shellfish containing products, Bee venom protein (honey bee), Penicillin v potassium, and Penicillins  PAST MEDICAL HISTORY: Past Medical History:  Diagnosis Date   Allergic state    GERD (gastroesophageal reflux disease)    History of chicken pox    History of paroxysmal atrial tachycardia    Hypertension    Tubular adenoma    History of    PAST SURGICAL HISTORY: Past Surgical History:  Procedure Laterality Date   COLONOSCOPY  10/09/2017   Diverticulosis/PHx CP 5 yr. MUS    EGD  10/09/2017   Gastritis/Gastric ulcer repeat 2 months MUS (Scheduled for 12/22/2017)   EGD  12/22/2017   Barrett's Esophagus/Gastritis/Repeat 75yr/MUS   R knee athroscpy, partial medial mensiectomy (Complex tear with both radial and horizontal componets at the posterior horn/body junction involving approximately 75% of the meniscus width, R knee arthroscopic partial synovectomy with medial  Right 08/19/2019   Dr. PPosey Pronto  EGD  05/23/2020   Barrett's Esophagus/Repeat 567yrTKT   COLONOSCOPY  03/27/2022   Tubular adenoma/PHx CP   EGD  03/27/2022   Normal EGD/Cirrhosis   EGD  03/27/2022   Cirrhosis/Repeat 3 years/CTL   [facility]   Tubular adenoma/PHx CP/Repeat 3y90yrTL   CATARACT EXTRACTION Bilateral    Done on 04/15/17 & 05/13/17   ENDOSCOPIC CARPAL TUNNEL RELEASE Left    HYSTERECTOMY     TEMPOROMANDIBULAR JOINT ARTHROPLASTY     TONSILLECTOMY       FAMILY HISTORY: Family History  Problem Relation Age of Onset   Colon cancer Mother    Heart failure Mother    Asthma Mother    Alzheimer's disease Mother    Diabetes type II Father    Kidney cancer Father    Colon cancer Father        Kidney cancer     SOCIAL HISTORY: Social History   Socioeconomic History   Marital status: Married   Tobacco Use   Smoking status: Former    Packs/day: 1.00    Years: 2.00    Additional pack years: 0.00    Total pack years: 2.00    Types: Cigarettes    Quit date: 12/08/1972    Years since quitting: 50.1    Passive exposure: Past   Smokeless tobacco: Never  Vaping Use   Vaping Use: Never used  Substance and Sexual Activity   Alcohol use: Never    Alcohol/week: 0.0 standard drinks of alcohol   Drug use: No   Sexual activity: Yes    Partners: Male    Birth control/protection: None    PHYSICAL EXAM: Vitals:   01/29/23 1026  BP: (!) 146/80  Pulse: 73   Body mass index is 26.79 kg/m. Weight: 75.3 kg (166 lb)   GENERAL: Alert, active, oriented x3  HEENT: Pupils equal reactive to light. Extraocular movements are intact. Sclera clear. Palpebral conjunctiva normal  red color.Pharynx clear.  NECK: Supple with no palpable mass and no adenopathy.  LUNGS: Sound clear with no rales rhonchi or wheezes.  HEART: Regular rhythm S1 and S2 without murmur.  ABDOMEN: Soft and depressible, nontender with no palpable mass, no hepatomegaly.   EXTREMITIES: Well-developed well-nourished symmetrical with no dependent edema.  NEUROLOGICAL: Awake alert oriented, facial expression symmetrical, moving all extremities.  REVIEW OF DATA: I have reviewed the following data today: Initial consult on 01/29/2023  Component Date Value   Glucose 01/29/2023 134 (H)    Sodium 01/29/2023 142    Potassium 01/29/2023 4.1    Chloride 01/29/2023 106    Carbon Dioxide (CO2) 01/29/2023 31.9    Urea Nitrogen (BUN) 01/29/2023 12    Creatinine 01/29/2023 0.7    Glomerular Filtration Ra* 01/29/2023 92    Calcium 01/29/2023 9.8    AST  01/29/2023 26    ALT  01/29/2023 14    Alk Phos (alkaline Phosp* 01/29/2023 195 (H)    Albumin 01/29/2023 4.1    Bilirubin, Total 01/29/2023 0.8    Protein, Total 01/29/2023 6.3    A/G Ratio 01/29/2023 1.9    WBC (White Blood Cell Co* 01/29/2023 5.8    RBC (Red Blood Cell  Coun* 01/29/2023 4.02 (L)    Hemoglobin 01/29/2023 13.0    Hematocrit 01/29/2023 37.3    MCV (Mean Corpuscular Vo* 01/29/2023 92.8    MCH (Mean Corpuscular He* 01/29/2023 32.3 (H)    MCHC (Mean Corpuscular H* 01/29/2023 34.9    Platelet Count 01/29/2023 86 (L)    RDW-CV (Red Cell Distrib* 01/29/2023 13.4    MPV (Mean Platelet Volum* 01/29/2023 10.0    Neutrophils 01/29/2023 3.06    Lymphocytes 01/29/2023 1.82    Monocytes 01/29/2023 0.46    Eosinophils 01/29/2023 0.38    Basophils 01/29/2023 0.04    Neutrophil % 01/29/2023 53.0    Lymphocyte % 01/29/2023 31.5    Monocyte % 01/29/2023 8.0    Eosinophil % 01/29/2023 6.6 (H)    Basophil% 01/29/2023 0.7    Immature Granulocyte % 01/29/2023 0.2    Immature Granulocyte Cou* 01/29/2023 0.01    Prothrombin Time 01/29/2023 13.6 (H)    Prothrombin INR 01/29/2023 1.2 (L)   Appointment on 01/21/2023  Component Date Value   Platelet Count 01/21/2023 87 (L)   Office Visit on 01/19/2023  Component Date Value   WBC (White Blood Cell Co* 01/19/2023 5.7    RBC (Red Blood Cell Coun* 01/19/2023 4.02 (L)    Hemoglobin 01/19/2023 12.9    Hematocrit 01/19/2023 37.2    MCV (Mean Corpuscular Vo* 01/19/2023 92.5    MCH (Mean Corpuscular He* 01/19/2023 32.1 (H)    MCHC (Mean Corpuscular H* 01/19/2023 34.7    Platelet Count 01/19/2023 86 (L)    RDW-CV (Red Cell Distrib* 01/19/2023 13.2    MPV (Mean Platelet Volum* 01/19/2023 10.5    Neutrophils 01/19/2023 3.41    Lymphocytes 01/19/2023 1.49    Monocytes 01/19/2023 0.43    Eosinophils 01/19/2023 0.33    Basophils 01/19/2023 0.03    Neutrophil % 01/19/2023 59.9    Lymphocyte % 01/19/2023 26.1    Monocyte % 01/19/2023 7.5    Eosinophil % 01/19/2023 5.8 (H)    Basophil% 01/19/2023 0.5    Immature Granulocyte % 01/19/2023 0.2    Immature Granulocyte Cou* 01/19/2023 0.01    Glucose 01/19/2023 94    Sodium 01/19/2023 141    Potassium 01/19/2023 4.1    Chloride 01/19/2023 108  Carbon Dioxide  (CO2) 01/19/2023 29.7    Urea Nitrogen (BUN) 01/19/2023 9    Creatinine 01/19/2023 0.6    Glomerular Filtration Ra* 01/19/2023 95    Calcium 01/19/2023 9.5    AST  01/19/2023 27    ALT  01/19/2023 13    Alk Phos (alkaline Phosp* 01/19/2023 202 (H)    Albumin 01/19/2023 4.0    Bilirubin, Total 01/19/2023 0.8    Protein, Total 01/19/2023 6.1    A/G Ratio 01/19/2023 1.9    Gamma Glutamyl Transfera* 01/19/2023 249 (H)   Office Visit on 12/09/2022  Component Date Value   WBC (White Blood Cell Co* 12/09/2022 5.5    RBC (Red Blood Cell Coun* 12/09/2022 4.14    Hemoglobin 12/09/2022 13.4    Hematocrit 12/09/2022 38.2    MCV (Mean Corpuscular Vo* 12/09/2022 92.3    MCH (Mean Corpuscular He* 12/09/2022 32.4 (H)    MCHC (Mean Corpuscular H* 12/09/2022 35.1    Platelet Count 12/09/2022 103 (L)    RDW-CV (Red Cell Distrib* 12/09/2022 13.2    MPV (Mean Platelet Volum* 12/09/2022 10.7    Neutrophils 12/09/2022 2.90    Lymphocytes 12/09/2022 1.98    Monocytes 12/09/2022 0.39    Eosinophils 12/09/2022 0.22    Basophils 12/09/2022 0.02    Neutrophil % 12/09/2022 52.4    Lymphocyte % 12/09/2022 35.9    Monocyte % 12/09/2022 7.1    Eosinophil % 12/09/2022 4.0    Basophil% 12/09/2022 0.4    Immature Granulocyte % 12/09/2022 0.2    Immature Granulocyte Cou* 12/09/2022 0.01    AFP, Serum, Tumor Marker* 12/09/2022 5.6    Prothrombin Time 12/09/2022 13.6 (H)    Prothrombin INR 12/09/2022 1.2 (L)    Protein, Total 12/09/2022 6.5    Albumin 12/09/2022 4.2    Bilirubin, Total 12/09/2022 0.6    Bilirubin, Conjugated 12/09/2022 0.19    Alk Phos (alkaline Phosp* 12/09/2022 187 (H)    AST  12/09/2022 29    ALT  12/09/2022 15    Glucose 12/09/2022 93    Sodium 12/09/2022 142    Potassium 12/09/2022 3.8    Chloride 12/09/2022 107    Carbon Dioxide (CO2) 12/09/2022 27.7    Calcium 12/09/2022 9.7    Urea Nitrogen (BUN) 12/09/2022 7    Creatinine 12/09/2022 0.6    Glomerular Filtration Ra*  12/09/2022 96    BUN/Crea Ratio 12/09/2022 11.7    Anion Gap w/K 12/09/2022 11.1      ASSESSMENT: Ms. Bielak is a 72 y.o. female presenting for consultation for cholelithiasis.    Patient was oriented about the diagnosis of cholelithiasis. Also oriented about what is the gallbladder, its anatomy and function and the implications of having stones. The patient was oriented about the treatment alternatives (observation vs cholecystectomy). Patient was oriented that a low percentage of patient will continue to have similar pain symptoms even after the gallbladder is removed. Surgical technique (open vs laparoscopic) was discussed. It was also discussed the goals of the surgery (decrease the pain episodes and avoid the risk of cholecystitis) and the risk of surgery including: bleeding, infection, common bile duct injury, stone retention, injury to other organs such as bowel, liver, stomach, other complications such as hernia, bowel obstruction among others. Also discussed with patient about anesthesia and its complications such as: reaction to medications, pneumonia, heart complications, death, among others.   I discussed with patient that she is high risk for surgery due to her portal hypertension.  Her Child-Pugh score  with labs done today is 5 point.  The mortality for abdominal surgery perioperative is 10%.  I discussed with patient higher rates of progression of her liver disease due to anesthesia.  Due to the patient constant pain in the right upper quadrant that is getting daily with each meals she feels that she would like to proceed with surgery despite her higher risk for complication or mortality.  The patient reports she understood all the risk and agreed to proceed with surgery.  Cholelithiasis without cholecystitis [K80.20]  PLAN: 1.  Robotic assisted laparoscopic cholecystectomy [id number]) 2.  CBC, CMP, INR 3.  Do not take aspirin 5 days before the procedure 4.  Contact us if has any  question or concern.   Patient and her husband verbalized understanding, all questions were answered, and were agreeable with the plan outlined above.   Herbert Pun, MD  Electronically signed by Herbert Pun, MD

## 2023-01-30 DIAGNOSIS — Z0100 Encounter for examination of eyes and vision without abnormal findings: Secondary | ICD-10-CM | POA: Diagnosis not present

## 2023-02-03 ENCOUNTER — Other Ambulatory Visit: Payer: Self-pay

## 2023-02-03 ENCOUNTER — Encounter
Admission: RE | Admit: 2023-02-03 | Discharge: 2023-02-03 | Disposition: A | Discharge status: Home / Self Care | Payer: Medicare HMO | Source: Ambulatory Visit | Attending: General Surgery | Admitting: General Surgery

## 2023-02-03 DIAGNOSIS — Z01812 Encounter for preprocedural laboratory examination: Secondary | ICD-10-CM

## 2023-02-03 HISTORY — DX: Pneumonia, unspecified organism: J18.9

## 2023-02-03 HISTORY — DX: Prediabetes: R73.03

## 2023-02-03 HISTORY — DX: Unspecified osteoarthritis, unspecified site: M19.90

## 2023-02-03 HISTORY — DX: Inflammatory liver disease, unspecified: K75.9

## 2023-02-03 NOTE — Patient Instructions (Addendum)
Your procedure is scheduled on: 02/09/23 - Monday Report to the Registration Desk on the 1st floor of the [facility]. To find out your arrival time, please call [phone] between 1PM - 3PM on: 02/06/23 -Friday If your arrival time is 6:00 am, do not arrive before that time as the [facility] entrance doors do not open until 6:00 am.  REMEMBER: Instructions that are not followed completely may result in serious medical risk, up to and including death; or upon the discretion of your surgeon and anesthesiologist your surgery may need to be rescheduled.  Do not eat food or drink any liquids after midnight the night before surgery.  No gum chewing or hard candies.  One week prior to surgery: Stop Anti-inflammatories (NSAIDS) such as Advil, Aleve, Ibuprofen, Motrin, Naproxen, Naprosyn and Aspirin based products such as Excedrin, Goody's Powder, BC Powder.   Stop ANY OVER THE COUNTER supplements until after surgery. VITAMIN C ,Calcium Carbonate-Vitamin D , VITAMIN D3 , folic acid ,VITAMIN E .  You may however, continue to take Tylenol if needed for pain up until the day of surgery.  TAKE ONLY THESE MEDICATIONS THE MORNING OF SURGERY WITH A SIP OF WATER:  pantoprazole (PROTONIX) - (take one the night before and one on the morning of surgery - helps to prevent nausea after surgery.)  Use albuterol (PROVENTIL HFA;VENTOLIN HFA)  on the day of surgery and bring to the hospital.   No Alcohol for 24 hours before or after surgery.  No Smoking including e-cigarettes for 24 hours before surgery.  No chewable tobacco products for at least 6 hours before surgery.  No nicotine patches on the day of surgery.  Do not use any "recreational" drugs for at least a week (preferably 2 weeks) before your surgery.  Please be advised that the combination of cocaine and anesthesia may have negative outcomes, up to and including death. If you test positive for cocaine, your surgery will be cancelled.  On  the morning of surgery brush your teeth with toothpaste and water, you may rinse your mouth with mouthwash if you wish. Do not swallow any toothpaste or mouthwash.  Use CHG Soap or wipes as directed on instruction sheet.  Do not wear jewelry, make-up, hairpins, clips or nail polish.  Do not wear lotions, powders, or perfumes.   Do not shave body hair from the neck down 48 hours before surgery.  Contact lenses, hearing aids and dentures may not be worn into surgery.  Do not bring valuables to the hospital. [facility] is not responsible for any missing/lost belongings or valuables.   Notify your doctor if there is any change in your medical condition (cold, fever, infection).  Wear comfortable clothing (specific to your surgery type) to the hospital.  After surgery, you can help prevent lung complications by doing breathing exercises.  Take deep breaths and cough every 1-2 hours. Your doctor may order a device called an Incentive Spirometer to help you take deep breaths. When coughing or sneezing, hold a pillow firmly against your incision with both hands. This is called "splinting." Doing this helps protect your incision. It also decreases belly discomfort.  If you are being admitted to the hospital overnight, leave your suitcase in the car. After surgery it may be brought to your room.  In case of increased patient census, it may be necessary for you, the patient, to continue your postoperative care in the Same Day Surgery department.  If you are being discharged the day of  surgery, you will not be allowed to drive home. You will need a responsible individual to drive you home and stay with you for 24 hours after surgery.   If you are taking public transportation, you will need to have a responsible individual with you.  Please call the [facility]. at [phone] if you have any questions about these instructions.  Surgery Visitation Policy:  Patients  undergoing a surgery or procedure may have two family members or support persons with them as long as the person is not COVID-19 positive or experiencing its symptoms.   Inpatient Visitation:    Visiting hours are 7 a.m. to 8 p.m. Up to four visitors are allowed at one time in a patient room. The visitors may rotate out with other people during the day. One designated support person (adult) may remain overnight.  Due to an increase in RSV and influenza rates and associated hospitalizations, children ages 48 and under will not be able to visit patients in [facility]. Masks continue to be strongly recommended.   Preparing for Surgery with CHLORHEXIDINE GLUCONATE (CHG) Soap  Chlorhexidine Gluconate (CHG) Soap  o An antiseptic cleaner that kills germs and bonds with the skin to continue killing germs even after washing  o Used for showering the night before surgery and morning of surgery  Before surgery, you can play an important role by reducing the number of germs on your skin.  CHG (Chlorhexidine gluconate) soap is an antiseptic cleanser which kills germs and bonds with the skin to continue killing germs even after washing.  Please do not use if you have an allergy to CHG or antibacterial soaps. If your skin becomes reddened/irritated stop using the CHG.  1. Shower the NIGHT BEFORE SURGERY and the MORNING OF SURGERY with CHG soap.  2. If you choose to wash your hair, wash your hair first as usual with your normal shampoo.  3. After shampooing, rinse your hair and body thoroughly to remove the shampoo.  4. Use CHG as you would any other liquid soap. You can apply CHG directly to the skin and wash gently with a scrungie or a clean washcloth.  5. Apply the CHG soap to your body only from the neck down. Do not use on open wounds or open sores. Avoid contact with your eyes, ears, mouth, and genitals (private parts). Wash face and genitals (private parts) with your normal  soap.  6. Wash thoroughly, paying special attention to the area where your surgery will be performed.  7. Thoroughly rinse your body with warm water.  8. Do not shower/wash with your normal soap after using and rinsing off the CHG soap.  9. Pat yourself dry with a clean towel.  10. Wear clean pajamas to bed the night before surgery.  12. Place clean sheets on your bed the night of your first shower and do not sleep with pets.  13. Shower again with the CHG soap on the day of surgery prior to arriving at the hospital.  14. Do not apply any deodorants/lotions/powders.  15. Please wear clean clothes to the hospital.

## 2023-02-04 ENCOUNTER — Encounter: Payer: Self-pay | Admitting: General Surgery

## 2023-02-04 ENCOUNTER — Encounter
Admission: RE | Admit: 2023-02-04 | Discharge: 2023-02-04 | Disposition: A | Discharge status: Home / Self Care | Payer: Medicare HMO | Source: Ambulatory Visit | Attending: General Surgery | Admitting: General Surgery

## 2023-02-04 DIAGNOSIS — Z01810 Encounter for preprocedural cardiovascular examination: Secondary | ICD-10-CM | POA: Insufficient documentation

## 2023-02-04 DIAGNOSIS — Z01812 Encounter for preprocedural laboratory examination: Secondary | ICD-10-CM

## 2023-02-04 NOTE — Progress Notes (Signed)
Perioperative / Anesthesia Services  Pre-Admission Testing Clinical Review / Preoperative Anesthesia Consult  Date: 02/06/23  Patient Demographics:  Name: Traci Lynn DOB:   17-Feb-1951 MRN:   [id number]  Planned Surgical Procedure(s):    Case: [id number] Date/Time: 02/09/23 0920   Procedures:      XI ROBOTIC ASSISTED LAPAROSCOPIC CHOLECYSTECTOMY (Abdomen)     INDOCYANINE GREEN FLUORESCENCE IMAGING (ICG)   Anesthesia type: General   Pre-op diagnosis: K80.20 Cholelithiasis w/o cholecystitis K74.60 cirrhosis of liver w/o ascites, unspecified hepatic cirrhosis type   Location: [facility] OR ROOM 04 / [facility]   Surgeons: Herbert Pun, MD     NOTE: Available PAT nursing documentation and vital signs have been reviewed. Clinical nursing staff has updated patient's PMH/PSHx, current medication list, and drug allergies/intolerances to ensure comprehensive history available to assist in medical decision making as it pertains to the aforementioned surgical procedure and anticipated anesthetic course. Extensive review of available clinical information personally performed. [place] PMH and PSHx updated with any diagnoses/procedures that  may have been inadvertently omitted during her intake with the pre-admission testing department's nursing staff.  Clinical Discussion:  [name] is a 72 y.o. female who is submitted for pre-surgical anesthesia review and clearance prior to her undergoing the above procedure. Patient is a Former Smoker (quit 05/1971). Pertinent PMH includes: PAT, palpitations, aortic atherosclerosis, portal hypertension, HTN, prediabetes, peripheral edema, asthma, GERD (on daily PPI), chronic ITP, cirrhosis, iron overload, TMJ (s/p arthroplasty).  Patient is followed by cardiology Saralyn Pilar, MD). She was last seen in the cardiology clinic on 09/24/2022; notes reviewed.  At the time of her clinic visit, patient doing well overall from a  cardiovascular perspective.  She reported 3 separate episodes of chest discomfort that were experienced at rest.  Patient described pain as a "stabbing sensation".  She denied any associated shortness of breath, PND, orthopnea, vertiginous symptoms, fatigue, or presyncope/syncope.  Patient with occasional episodes of palpitations and mild peripheral edema. Patient with a past medical history significant for cardiovascular diagnoses. Documented physical exam was grossly benign, providing no evidence of acute exacerbation and/or decompensation of the patient's known cardiovascular conditions.  Long-term cardiac event monitor study performed on 03/28/2019 revealing a predominant underlying sinus rhythm with a mean heart rate of 70 bpm; range 46-120 bpm.  There was rare atrial and ventricular ectopy.  1 brief atrial run observed.  No patient triggered events.  Patient has not had any episodes of palpitations since completion of the study per her report.  Stress echocardiogram was performed on 09/04/2021 revealing a normal left ventricular systolic function with an EF of >55%.  Patient able to achieve [facility] after 6 minutes of stress testing using Bruce protocol.  Patient able to achieve 7 METS of physical activity without experiencing any significant angina/anginal equivalent symptoms.  No valvular stenosis noted on exam.  Blood pressure well controlled at 118/68 mmHg on currently prescribed beta-blocker (metoprolol tartrate) monotherapy.  Patient not currently taking any type of lipid-lowering therapies for ASCVD prevention.  She does have a prediabetes diagnosis; last HgbA1c was 5.4% when checked on 04/01/2021.  She does not have an OSAH diagnosis. Functional capacity, as defined by DASI, is documented as being >/= 4 METS. No changes were made to her medication regimen during her visit with cardiology.  Patient scheduled to follow-up with outpatient cardiology in 6 months or sooner if needed.  [name]  is scheduled for an elective XI ROBOTIC ASSISTED LAPAROSCOPIC CHOLECYSTECTOMY on 02/09/2023 with  Dr. Herbert Pun, MD.  Given patient's past medical history significant for cardiovascular diagnoses, presurgical cardiac clearance was sought by the PAT team. Per cardiology, "this patient is optimized for surgery and may proceed with the planned procedural course with a LOW risk of significant perioperative cardiovascular complications".   In review of her medication reconciliation, the patient is not noted to be taking any type of anticoagulation or antiplatelet therapies that would need to be held during her perioperative course.  Patient denies previous perioperative complications with anesthesia in the past. In review of the available records, it is noted that patient underwent a general anesthetic course here at [facility] ([facility] in 07/2022 without documented complications.      07/29/2022   12:54 PM 07/29/2022   12:44 PM 07/29/2022   12:34 PM  Vitals with BMI  Systolic [id number]  Diastolic 83 67 69  Pulse 77 77 74    Providers/Specialists:   NOTE: Primary physician provider listed below. Patient may have been seen by APP or partner within same practice.   PROVIDER ROLE / SPECIALTY LAST Tanna Savoy, MD General Surgery (Surgeon) 01/27/2023  Dion Body, MD Primary Care Provider 01/19/2023  Isaias Cowman, MD Cardiology 09/24/2022  Sharlet Salina, MD Physiatry 01/22/2023  Randa Evens, MD Medical Oncology/Hematology 04/11/2022   Allergies:  Pork-derived products, Penicillin v potassium, Bee venom, and Shellfish allergy  Current Home Medications:   No current facility-administered medications for this encounter.    albuterol (PROVENTIL HFA;VENTOLIN HFA) 108 (90 Base) MCG/ACT inhaler   Ascorbic Acid (VITAMIN C PO)   baclofen (LIORESAL) 10 MG tablet   Calcium Carbonate-Vitamin D (CALCIUM 600+D PO)    cetirizine (ZYRTEC) 10 MG tablet   Cholecalciferol (VITAMIN D3) 5000 units CAPS   EPINEPHrine (EPI-PEN) 0.3 mg/0.3 mL SOAJ injection   fluticasone (FLONASE) 50 MCG/ACT nasal spray   folic acid (FOLVITE) 1 MG tablet   meloxicam (MOBIC) 15 MG tablet   metoprolol tartrate (LOPRESSOR) 25 MG tablet   pantoprazole (PROTONIX) 40 MG tablet   traMADol (ULTRAM) 50 MG tablet   VITAMIN E PO   History:   Past Medical History:  Diagnosis Date   Allergic genetic state    Aortic atherosclerosis (HCC)    Arthritis of both hands    Asthma    seasonal   Chronic ITP (idiopathic thrombocytopenia) (HCC)    a.) saw hematology 04/2022 - workup negative   Cirrhosis (HCC)    Diverticulosis    GERD (gastroesophageal reflux disease)    History of hepatitis A    Hypertension    Iron overload    Lumbago    Palpitations    Paroxysmal atrial tachycardia    a.) stress echo 09/04/2021: EF >55%; met MPHR without symptoms; normal.   Peripheral edema    Pneumonia    as a infant   Portal hypertension (HCC)    Pre-diabetes    TMJ (temporomandibular joint disorder)    a.) s/p TMJ arthroplasty   Tubular adenoma of colon    Past Surgical History:  Procedure Laterality Date   ABDOMINAL HYSTERECTOMY  1979   CARPAL TUNNEL RELEASE Left 1997   CATARACT EXTRACTION W/PHACO Right 04/15/2017   Procedure: CATARACT EXTRACTION PHACO AND INTRAOCULAR LENS PLACEMENT (IOC);  Surgeon: Estill Cotta, MD;  Location: [facility] ORS;  Service: Ophthalmology;  Laterality: Right;  Lot# [id number] H [place]: 01:02.8 AP%: 19.7 CDE: 25.43   CATARACT EXTRACTION W/PHACO Left 05/13/2017   Procedure: CATARACT EXTRACTION PHACO AND  INTRAOCULAR LENS PLACEMENT (IOC);  Surgeon: Estill Cotta, MD;  Location: [facility] ORS;  Service: Ophthalmology;  Laterality: Left;  [place] 1:07.6 AP% 22.9 CDE 31.29 Fluid Pack lot # [id number]   COLONOSCOPY WITH PROPOFOL N/A 10/09/2017   Procedure: COLONOSCOPY WITH PROPOFOL;  Surgeon: Lollie Sails, MD;  Location:  [facility] ENDOSCOPY;  Service: Endoscopy;  Laterality: N/A;   COLONOSCOPY WITH PROPOFOL N/A 07/29/2022   Procedure: COLONOSCOPY WITH PROPOFOL;  Surgeon: Lesly Rubenstein, MD;  Location: [facility] ENDOSCOPY;  Service: Endoscopy;  Laterality: N/A;   ESOPHAGOGASTRODUODENOSCOPY (EGD) WITH PROPOFOL N/A 10/09/2017   Procedure: ESOPHAGOGASTRODUODENOSCOPY (EGD) WITH PROPOFOL;  Surgeon: Lollie Sails, MD;  Location: [facility] ENDOSCOPY;  Service: Endoscopy;  Laterality: N/A;   ESOPHAGOGASTRODUODENOSCOPY (EGD) WITH PROPOFOL N/A 12/22/2017   Procedure: ESOPHAGOGASTRODUODENOSCOPY (EGD) WITH PROPOFOL;  Surgeon: Lollie Sails, MD;  Location: [facility] ENDOSCOPY;  Service: Endoscopy;  Laterality: N/A;   ESOPHAGOGASTRODUODENOSCOPY (EGD) WITH PROPOFOL N/A 05/23/2020   Procedure: ESOPHAGOGASTRODUODENOSCOPY (EGD) WITH PROPOFOL;  Surgeon: [place], Benay Pike, MD;  Location: [facility] ENDOSCOPY;  Service: Gastroenterology;  Laterality: N/A;   EYE SURGERY     KNEE ARTHROSCOPY WITH MEDIAL MENISECTOMY Right 08/19/2019   Procedure: KNEE ARTHROSCOPY WITH PARTIAL MEDIAL MENISECTOMY,  PLICA EXCISION;  Surgeon: Leim Fabry, MD;  Location: [place];  Service: Orthopedics;  Laterality: Right;   REFRACTIVE SURGERY     SHOULDER ARTHROSCOPY WITH OPEN ROTATOR CUFF REPAIR Left 09/24/2018   Procedure: SHOULDER ARTHROSCOPY WITH OPEN ROTATOR CUFF REPAIR;  Surgeon: Leim Fabry, MD;  Location: [facility] ORS;  Service: Orthopedics;  Laterality: Left;   TMJ ARTHROPLASTY     TONSILLECTOMY AND ADENOIDECTOMY     Family History  Problem Relation Age of Onset   Kidney disease Father        Deceased, 52   Colon cancer Mother        Deceased 67   Healthy Son    Breast cancer Neg Hx    Social History   Tobacco Use   Smoking status: Former    Types: Cigarettes    Quit date: 06/01/1971    Years since quitting: 51.7   Smokeless tobacco: Never  Vaping Use   Vaping Use: Never used  Substance Use Topics   Alcohol use: No   Drug use: No     Pertinent Clinical Results:  LABS:     Ref Range & Units 01/29/2023 Comments  WBC (White Blood Cell Count) 4.1 - 10.2 10^3/uL 5.8   RBC (Red Blood Cell Count) 4.04 - 5.48 10^6/uL 4.02 Low    Hemoglobin 12.0 - 15.0 gm/dL 13.0   Hematocrit 35.0 - 47.0 % 37.3   MCV (Mean Corpuscular Volume) 80.0 - 100.0 fl 92.8   MCH (Mean Corpuscular Hemoglobin) 27.0 - 31.2 pg 32.3 High    MCHC (Mean Corpuscular Hemoglobin Concentration) 32.0 - 36.0 gm/dL 34.9   Platelet Count 150 - 450 10^3/uL 86 Low  Smear review agrees with analyzer results. Results consistent with patient history.  RDW-CV (Red Cell Distribution Width) 11.6 - 14.8 % 13.4   MPV (Mean Platelet Volume) 9.4 - 12.4 fl 10.0   Neutrophils 1.50 - 7.80 10^3/uL 3.06   Lymphocytes 1.00 - 3.60 10^3/uL 1.82   Monocytes 0.00 - 1.50 10^3/uL 0.46   Eosinophils 0.00 - 0.55 10^3/uL 0.38   Basophils 0.00 - 0.09 10^3/uL 0.04   Neutrophil % 32.0 - 70.0 % 53.0   Lymphocyte % 10.0 - 50.0 % 31.5   Monocyte % 4.0 - 13.0 % 8.0   Eosinophil %  1.0 - 5.0 % 6.6 High    Basophil% 0.0 - 2.0 % 0.7   Immature Granulocyte % <=0.7 % 0.2   Immature Granulocyte Count <=0.06 10^3/L 0.01   Resulting Agency  [facility]    Specimen Collected: 01/29/23 11:21   Performed by: [place]: 01/29/23 14:35  Received From: [place]  Result Received: 02/02/23 09:28    Ref Range & Units 01/29/2023  Glucose 70 - 110 mg/dL 134 High   Sodium 136 - 145 mmol/L 142  Potassium 3.6 - 5.1 mmol/L 4.1  Chloride 97 - 109 mmol/L 106  Carbon Dioxide (CO2) 22.0 - 32.0 mmol/L 31.9  Urea Nitrogen (BUN) 7 - 25 mg/dL 12  Creatinine 0.6 - 1.1 mg/dL 0.7  Glomerular Filtration Rate (eGFR) >60 mL/min/1.73sq m 92  Calcium 8.7 - 10.3 mg/dL 9.8  AST 8 - 39 U/L 26  ALT 5 - 38 U/L 14  Alk Phos (alkaline Phosphatase) 34 - 104 U/L 195 High   Albumin 3.5 - 4.8 g/dL 4.1  Bilirubin, Total 0.3 - 1.2 mg/dL 0.8  Protein, Total 6.1 - 7.9  g/dL 6.3  A/G Ratio 1.0 - 5.0 gm/dL 1.9  Resulting Agency  [place] - [facility]  Specimen Collected: 01/29/23 11:21   Performed by: [place]: 01/29/23 16:44  Received From: [place]  Result Received: 02/02/23 09:28    ECG: Date: 02/04/2023 Time ECG obtained: 1442 AM Rate: 77 bpm Rhythm: normal sinus Axis (leads I and aVF): Normal Intervals: PR 190 ms. QRS 92 ms. QTc 439 ms. ST segment and T wave changes: No evidence of acute ST segment elevation or depression Comparison: Similar to previous tracing obtained on 04/520/2020   IMAGING / PROCEDURES: US ABDOMEN LIMITED RUQ (LIVER/GB) performed on 01/19/2023 Gallbladder is nondistended with a few shadowing calculi measuring up to 6.9 mm No pericholecystic fluid Bile duct measuring 3.9 mm in diameter with no definite intrahepatic biliary ductal dilatation No evidence of biliary obstruction Coarse heterogeneous echotexture without focal hepatic lesion.  Portal vein is patent on color Doppler imaging with normal function of blood flow towards the liver.  CT ABDOMEN PELVIS W CONTRAST performed on 07/04/2022 Short segment narrowing of the mid sigmoid colon may reflect an area of peristalsis. However, would suggest further evaluation with colonoscopy to evaluate for underlying malignancy. Cirrhotic hepatic morphology with sequela of portal hypertension Cholelithiasis without findings of acute cholecystitis. Colonic diverticulosis without findings of acute diverticulitis. Aortic atherosclerosis   STRESS ECHOCARDIOGRAM performed on 09/04/2021 Normal left ventricular systolic function with an EF of >55% MPHR = 168 bpm; target achieved Maximum workload = 7.0 METS Gradients normal; no valvular stenosis Study determined to be normal  LONG TERM CARDIAC EVENT MONITOR STUDY performed on 03/28/2019 Predominant underlying sinus rhythm with a mean heart rate of 70 bpm; range 46-120 bpm.    There was rare atrial and ventricular ectopy.   1 brief atrial run observed.   No patient triggered events.    Impression and Plan:  Traci Lynn has been referred for pre-anesthesia review and clearance prior to her undergoing the planned anesthetic and procedural courses. Available labs, pertinent testing, and imaging results were personally reviewed by me in preparation for upcoming operative/procedural course. [facility] medical record has been updated following extensive record review and patient interview with PAT staff.   This patient has been appropriately cleared by cardiology with an overall LOW risk of significant  perioperative cardiovascular complications. Based on clinical review performed today (02/06/23), barring any significant acute changes in the patient's overall condition, it is anticipated that she will be able to proceed with the planned surgical intervention. Any acute changes in clinical condition may necessitate her procedure being postponed and/or cancelled. Patient will meet with anesthesia team (MD and/or CRNA) on the day of her procedure for preoperative evaluation/assessment. Questions regarding anesthetic course will be fielded at that time.   Pre-surgical instructions were reviewed with the patient during her PAT appointment, and questions were fielded to satisfaction by PAT clinical staff. She has been instructed on which medications that she will need to hold prior to surgery, as well as the ones that have been deemed safe/appropriate to take of the day of her procedure. As part of the general education provided by PAT, patient made aware both verbally and in writing, that she would need to abstain from the use of any illegal substances during her perioperative course.  She was advised that failure to follow the provided instructions could necessitate case cancellation or result serious perioperative complications up to and including death. Patient encouraged to  contact PAT and/or her surgeon's office to discuss any questions or concerns that may arise prior to surgery; verbalized understanding.   Honor Loh, MSN, APRN, FNP-C, CEN [facility]  Peri-operative Services Nurse Practitioner Phone: [phone] Fax: [phone] 02/06/23 5:40 PM  NOTE: This note has been prepared using Dragon dictation software. Despite my best ability to proofread, there is always the potential that unintentional transcriptional errors may still occur from this process.

## 2023-02-08 MED ORDER — LACTATED RINGERS IV SOLN: INTRAVENOUS | Status: DC

## 2023-02-08 MED ORDER — INDOCYANINE GREEN 25 MG IV SOLR
1.2500 mg | Freq: Once | INTRAVENOUS | Status: AC
  Administered 2023-02-09: 1.25 mg via INTRAVENOUS
  Filled 2023-02-08: qty 0.5

## 2023-02-08 MED ORDER — VANCOMYCIN HCL IN DEXTROSE 1-5 GM/200ML-% IV SOLN: 1000.0000 mg | INTRAVENOUS | Status: AC

## 2023-02-08 MED ORDER — CHLORHEXIDINE GLUCONATE 0.12 % MT SOLN: 15.0000 mL | Freq: Once | OROMUCOSAL | Status: AC

## 2023-02-08 MED ORDER — ORAL CARE MOUTH RINSE: 15.0000 mL | Freq: Once | OROMUCOSAL | Status: AC

## 2023-02-09 ENCOUNTER — Ambulatory Visit
Admission: RE | Admit: 2023-02-09 | Discharge: 2023-02-09 | Disposition: A | Discharge status: Home / Self Care | Payer: Medicare HMO | Attending: General Surgery | Admitting: General Surgery

## 2023-02-09 ENCOUNTER — Ambulatory Visit: Payer: Medicare HMO | Admitting: Urgent Care

## 2023-02-09 ENCOUNTER — Encounter: Payer: Self-pay | Admitting: General Surgery

## 2023-02-09 ENCOUNTER — Encounter
Admission: RE | Disposition: A | Discharge status: Home / Self Care | Payer: Self-pay | Source: Home / Self Care | Attending: General Surgery

## 2023-02-09 ENCOUNTER — Other Ambulatory Visit: Payer: Self-pay

## 2023-02-09 DIAGNOSIS — K7460 Unspecified cirrhosis of liver: Secondary | ICD-10-CM | POA: Insufficient documentation

## 2023-02-09 DIAGNOSIS — R161 Splenomegaly, not elsewhere classified: Secondary | ICD-10-CM | POA: Diagnosis not present

## 2023-02-09 DIAGNOSIS — R7303 Prediabetes: Secondary | ICD-10-CM | POA: Insufficient documentation

## 2023-02-09 DIAGNOSIS — K8020 Calculus of gallbladder without cholecystitis without obstruction: Secondary | ICD-10-CM | POA: Diagnosis not present

## 2023-02-09 DIAGNOSIS — I10 Essential (primary) hypertension: Secondary | ICD-10-CM | POA: Insufficient documentation

## 2023-02-09 DIAGNOSIS — K8010 Calculus of gallbladder with chronic cholecystitis without obstruction: Secondary | ICD-10-CM | POA: Diagnosis not present

## 2023-02-09 DIAGNOSIS — Z79899 Other long term (current) drug therapy: Secondary | ICD-10-CM | POA: Insufficient documentation

## 2023-02-09 DIAGNOSIS — K219 Gastro-esophageal reflux disease without esophagitis: Secondary | ICD-10-CM | POA: Diagnosis not present

## 2023-02-09 DIAGNOSIS — I4891 Unspecified atrial fibrillation: Secondary | ICD-10-CM | POA: Diagnosis not present

## 2023-02-09 DIAGNOSIS — Z87891 Personal history of nicotine dependence: Secondary | ICD-10-CM | POA: Insufficient documentation

## 2023-02-09 DIAGNOSIS — K766 Portal hypertension: Secondary | ICD-10-CM | POA: Insufficient documentation

## 2023-02-09 DIAGNOSIS — D693 Immune thrombocytopenic purpura: Secondary | ICD-10-CM | POA: Insufficient documentation

## 2023-02-09 HISTORY — DX: Atherosclerosis of aorta: I70.0

## 2023-02-09 HISTORY — DX: Localized edema: R60.0

## 2023-02-09 HISTORY — DX: Unspecified temporomandibular joint disorder, unspecified side: M26.609

## 2023-02-09 HISTORY — DX: Portal hypertension: K76.6

## 2023-02-09 HISTORY — DX: Palpitations: R00.2

## 2023-02-09 HISTORY — DX: Edema, unspecified: R60.9

## 2023-02-09 HISTORY — DX: Personal history of other infectious and parasitic diseases: Z86.19

## 2023-02-09 HISTORY — DX: Immune thrombocytopenic purpura: D69.3

## 2023-02-09 HISTORY — DX: Primary osteoarthritis, right hand: M19.041

## 2023-02-09 HISTORY — DX: Primary osteoarthritis, left hand: M19.042

## 2023-02-09 HISTORY — DX: Diverticulosis of intestine, part unspecified, without perforation or abscess without bleeding: K57.90

## 2023-02-09 SURGERY — CHOLECYSTECTOMY, ROBOT-ASSISTED, LAPAROSCOPIC
Anesthesia: General | Site: Abdomen

## 2023-02-09 MED ORDER — ROCURONIUM BROMIDE 10 MG/ML (PF) SYRINGE
PREFILLED_SYRINGE | INTRAVENOUS | Status: AC
  Filled 2023-02-09: qty 10

## 2023-02-09 MED ORDER — MIDAZOLAM HCL 2 MG/2ML IJ SOLN
INTRAMUSCULAR | Status: DC | PRN
  Administered 2023-02-09: 2 mg via INTRAVENOUS

## 2023-02-09 MED ORDER — EPHEDRINE SULFATE (PRESSORS) 50 MG/ML IJ SOLN
INTRAMUSCULAR | Status: DC | PRN
  Administered 2023-02-09: 5 mg via INTRAVENOUS

## 2023-02-09 MED ORDER — ONDANSETRON HCL 4 MG/2ML IJ SOLN: 4.0000 mg | Freq: Once | INTRAMUSCULAR | Status: DC | PRN

## 2023-02-09 MED ORDER — VANCOMYCIN HCL IN DEXTROSE 1-5 GM/200ML-% IV SOLN
INTRAVENOUS | Status: AC
  Administered 2023-02-09: 1000 mg via INTRAVENOUS
  Filled 2023-02-09: qty 200

## 2023-02-09 MED ORDER — FENTANYL CITRATE (PF) 100 MCG/2ML IJ SOLN
INTRAMUSCULAR | Status: DC | PRN
  Administered 2023-02-09 (×2): 50 ug via INTRAVENOUS

## 2023-02-09 MED ORDER — ACETAMINOPHEN 10 MG/ML IV SOLN
INTRAVENOUS | Status: AC
  Filled 2023-02-09: qty 100

## 2023-02-09 MED ORDER — EPINEPHRINE PF 1 MG/ML IJ SOLN
INTRAMUSCULAR | Status: AC
  Filled 2023-02-09: qty 1

## 2023-02-09 MED ORDER — DEXAMETHASONE SODIUM PHOSPHATE 10 MG/ML IJ SOLN
INTRAMUSCULAR | Status: AC
  Filled 2023-02-09: qty 1

## 2023-02-09 MED ORDER — EPHEDRINE 5 MG/ML INJ
INTRAVENOUS | Status: AC
  Filled 2023-02-09: qty 5

## 2023-02-09 MED ORDER — DEXMEDETOMIDINE HCL IN NACL 80 MCG/20ML IV SOLN
INTRAVENOUS | Status: AC
  Filled 2023-02-09: qty 20

## 2023-02-09 MED ORDER — KETOROLAC TROMETHAMINE 30 MG/ML IJ SOLN
INTRAMUSCULAR | Status: DC | PRN
  Administered 2023-02-09: 15 mg via INTRAVENOUS

## 2023-02-09 MED ORDER — ACETAMINOPHEN 10 MG/ML IV SOLN
INTRAVENOUS | Status: DC | PRN
  Administered 2023-02-09: 1000 mg via INTRAVENOUS

## 2023-02-09 MED ORDER — MIDAZOLAM HCL 2 MG/2ML IJ SOLN
INTRAMUSCULAR | Status: AC
  Filled 2023-02-09: qty 2

## 2023-02-09 MED ORDER — PROPOFOL 10 MG/ML IV BOLUS
INTRAVENOUS | Status: DC | PRN
  Administered 2023-02-09: 120 mg via INTRAVENOUS

## 2023-02-09 MED ORDER — CHLORHEXIDINE GLUCONATE 0.12 % MT SOLN
OROMUCOSAL | Status: AC
  Administered 2023-02-09: 15 mL via OROMUCOSAL
  Filled 2023-02-09: qty 15

## 2023-02-09 MED ORDER — BUPIVACAINE HCL (PF) 0.25 % IJ SOLN
INTRAMUSCULAR | Status: AC
  Filled 2023-02-09: qty 30

## 2023-02-09 MED ORDER — GLYCOPYRROLATE 0.2 MG/ML IJ SOLN
INTRAMUSCULAR | Status: DC | PRN
  Administered 2023-02-09: .2 mg via INTRAVENOUS

## 2023-02-09 MED ORDER — BUPIVACAINE-EPINEPHRINE 0.25% -1:200000 IJ SOLN
INTRAMUSCULAR | Status: DC | PRN
  Administered 2023-02-09: 20 mL

## 2023-02-09 MED ORDER — "VISTASEAL 4 ML SINGLE DOSE KIT "
PACK | CUTANEOUS | Status: AC
  Filled 2023-02-09: qty 4

## 2023-02-09 MED ORDER — OXYCODONE HCL 5 MG PO TABS: 5.0000 mg | ORAL_TABLET | ORAL | Status: DC | PRN

## 2023-02-09 MED ORDER — DEXMEDETOMIDINE HCL IN NACL 80 MCG/20ML IV SOLN
INTRAVENOUS | Status: DC | PRN
  Administered 2023-02-09: 10 ug via BUCCAL

## 2023-02-09 MED ORDER — LIDOCAINE HCL (PF) 2 % IJ SOLN
INTRAMUSCULAR | Status: AC
  Filled 2023-02-09: qty 5

## 2023-02-09 MED ORDER — 0.9 % SODIUM CHLORIDE (POUR BTL) OPTIME
TOPICAL | Status: DC | PRN
  Administered 2023-02-09: 500 mL

## 2023-02-09 MED ORDER — ROCURONIUM BROMIDE 100 MG/10ML IV SOLN
INTRAVENOUS | Status: DC | PRN
  Administered 2023-02-09: 50 mg via INTRAVENOUS

## 2023-02-09 MED ORDER — SUGAMMADEX SODIUM 200 MG/2ML IV SOLN
INTRAVENOUS | Status: DC | PRN
  Administered 2023-02-09: 200 mg via INTRAVENOUS

## 2023-02-09 MED ORDER — ONDANSETRON HCL 4 MG/2ML IJ SOLN
INTRAMUSCULAR | Status: AC
  Filled 2023-02-09: qty 2

## 2023-02-09 MED ORDER — FENTANYL CITRATE (PF) 100 MCG/2ML IJ SOLN
25.0000 ug | INTRAMUSCULAR | Status: DC | PRN
  Administered 2023-02-09 (×2): 25 ug via INTRAVENOUS

## 2023-02-09 MED ORDER — FENTANYL CITRATE (PF) 100 MCG/2ML IJ SOLN
INTRAMUSCULAR | Status: AC
  Filled 2023-02-09: qty 2

## 2023-02-09 MED ORDER — GLYCOPYRROLATE 0.2 MG/ML IJ SOLN
INTRAMUSCULAR | Status: AC
Start: 2023-02-09 — End: ?
  Filled 2023-02-09: qty 1

## 2023-02-09 MED ORDER — DEXAMETHASONE SODIUM PHOSPHATE 10 MG/ML IJ SOLN
INTRAMUSCULAR | Status: DC | PRN
  Administered 2023-02-09: 10 mg via INTRAVENOUS

## 2023-02-09 MED ORDER — KETOROLAC TROMETHAMINE 30 MG/ML IJ SOLN
INTRAMUSCULAR | Status: AC
  Filled 2023-02-09: qty 1

## 2023-02-09 MED ORDER — "VISTASEAL 4 ML SINGLE DOSE KIT "
PACK | CUTANEOUS | Status: DC | PRN
  Administered 2023-02-09: 4 mL via TOPICAL

## 2023-02-09 MED ORDER — FENTANYL CITRATE (PF) 100 MCG/2ML IJ SOLN
INTRAMUSCULAR | Status: AC
  Administered 2023-02-09: 50 ug via INTRAVENOUS
  Filled 2023-02-09: qty 2

## 2023-02-09 MED ORDER — OXYCODONE HCL 5 MG PO TABS: 5.0000 mg | ORAL_TABLET | ORAL | 0 refills | Status: DC | PRN

## 2023-02-09 MED ORDER — PROPOFOL 10 MG/ML IV BOLUS
INTRAVENOUS | Status: AC
  Filled 2023-02-09: qty 20

## 2023-02-09 MED ORDER — LIDOCAINE HCL (CARDIAC) PF 100 MG/5ML IV SOSY
PREFILLED_SYRINGE | INTRAVENOUS | Status: DC | PRN
  Administered 2023-02-09: 100 mg via INTRAVENOUS

## 2023-02-09 MED ORDER — ONDANSETRON HCL 4 MG/2ML IJ SOLN
INTRAMUSCULAR | Status: DC | PRN
  Administered 2023-02-09: 4 mg via INTRAVENOUS

## 2023-02-09 MED ORDER — OXYCODONE HCL 5 MG PO TABS
ORAL_TABLET | ORAL | Status: AC
  Administered 2023-02-09: 5 mg via ORAL
  Filled 2023-02-09: qty 1

## 2023-02-09 SURGICAL SUPPLY — 57 items
ADH SKN CLS APL DERMABOND .7 (GAUZE/BANDAGES/DRESSINGS) ×2
APL LAPSCP 35 DL APL RGD (MISCELLANEOUS) ×2
APPLICATOR VISTASEAL 35 (MISCELLANEOUS) IMPLANT
BAG PRESSURE INF REUSE 1000 (BAG) IMPLANT
BLADE SURG SZ11 CARB STEEL (BLADE) ×2 IMPLANT
CANNULA REDUC XI 12-8 STAPL (CANNULA) ×2
CANNULA REDUCER 12-8 DVNC XI (CANNULA) ×2 IMPLANT
CATH REDDICK CHOLANGI 4FR 50CM (CATHETERS) IMPLANT
CLIP LIGATING HEM O LOK PURPLE (MISCELLANEOUS) IMPLANT
CLIP LIGATING HEMO O LOK GREEN (MISCELLANEOUS) ×2 IMPLANT
DERMABOND ADVANCED .7 DNX12 (GAUZE/BANDAGES/DRESSINGS) ×2 IMPLANT
DRAPE ARM DVNC X/XI (DISPOSABLE) ×8 IMPLANT
DRAPE C-ARM XRAY 36X54 (DRAPES) IMPLANT
DRAPE COLUMN DVNC XI (DISPOSABLE) ×2 IMPLANT
DRAPE DA VINCI XI ARM (DISPOSABLE) ×8
DRAPE DA VINCI XI COLUMN (DISPOSABLE) ×2
ELECT REM PT RETURN 9FT ADLT (ELECTROSURGICAL) ×2
ELECTRODE REM PT RTRN 9FT ADLT (ELECTROSURGICAL) ×2 IMPLANT
GLOVE BIO SURGEON STRL SZ 6.5 (GLOVE) ×4 IMPLANT
GLOVE BIOGEL PI IND STRL 6.5 (GLOVE) ×4 IMPLANT
GOWN STRL REUS W/ TWL LRG LVL3 (GOWN DISPOSABLE) ×6 IMPLANT
GOWN STRL REUS W/TWL LRG LVL3 (GOWN DISPOSABLE) ×6
GRASPER SUT TROCAR 14GX15 (MISCELLANEOUS) ×2 IMPLANT
IRRIGATOR SUCT 8 DISP DVNC XI (IRRIGATION / IRRIGATOR) IMPLANT
IRRIGATOR SUCTION 8MM XI DISP (IRRIGATION / IRRIGATOR)
IV CATH ANGIO 12GX3 LT BLUE (NEEDLE) IMPLANT
IV NS 1000ML (IV SOLUTION)
IV NS 1000ML BAXH (IV SOLUTION) IMPLANT
KIT PINK PAD W/HEAD ARE REST (MISCELLANEOUS) ×2
KIT PINK PAD W/HEAD ARM REST (MISCELLANEOUS) ×2 IMPLANT
LABEL OR SOLS (LABEL) ×2 IMPLANT
MANIFOLD NEPTUNE II (INSTRUMENTS) ×2 IMPLANT
NDL INSUFFLATION 14GA 120MM (NEEDLE) ×2 IMPLANT
NEEDLE HYPO 22GX1.5 SAFETY (NEEDLE) ×2 IMPLANT
NEEDLE INSUFFLATION 14GA 120MM (NEEDLE) ×2 IMPLANT
NS IRRIG 500ML POUR BTL (IV SOLUTION) ×2 IMPLANT
OBTURATOR OPTICAL STANDARD 8MM (TROCAR) ×2
OBTURATOR OPTICAL STND 8 DVNC (TROCAR) ×2
OBTURATOR OPTICALSTD 8 DVNC (TROCAR) ×2 IMPLANT
PACK LAP CHOLECYSTECTOMY (MISCELLANEOUS) ×2 IMPLANT
SEAL CANN UNIV 5-8 DVNC XI (MISCELLANEOUS) ×6 IMPLANT
SEAL XI 5MM-8MM UNIVERSAL (MISCELLANEOUS) ×6
SET TUBE SMOKE EVAC HIGH FLOW (TUBING) ×2 IMPLANT
SOL ELECTROSURG ANTI STICK (MISCELLANEOUS) ×2
SOLUTION ELECTROSURG ANTI STCK (MISCELLANEOUS) ×2 IMPLANT
SPIKE FLUID TRANSFER (MISCELLANEOUS) ×4 IMPLANT
SPONGE T-LAP 4X18 ~~LOC~~+RFID (SPONGE) IMPLANT
STAPLER CANNULA SEAL DVNC XI (STAPLE) ×2 IMPLANT
STAPLER CANNULA SEAL XI (STAPLE) ×2
SUT MNCRL 4-0 (SUTURE) ×4
SUT MNCRL 4-0 27XMFL (SUTURE) ×4
SUT VICRYL 0 UR6 27IN ABS (SUTURE) ×2 IMPLANT
SUTURE MNCRL 4-0 27XMF (SUTURE) ×2 IMPLANT
SYS BAG RETRIEVAL 10MM (BASKET) ×2
SYSTEM BAG RETRIEVAL 10MM (BASKET) ×2 IMPLANT
TRAP FLUID SMOKE EVACUATOR (MISCELLANEOUS) ×2 IMPLANT
WATER STERILE IRR 500ML POUR (IV SOLUTION) ×2 IMPLANT

## 2023-02-09 NOTE — Transfer of Care (Signed)
Immediate Anesthesia Transfer of Care Note  Patient: Traci Lynn  Procedure(s) Performed: XI ROBOTIC ASSISTED LAPAROSCOPIC CHOLECYSTECTOMY (Abdomen) INDOCYANINE GREEN FLUORESCENCE IMAGING (ICG)  Patient Location: PACU  Anesthesia Type:General  Level of Consciousness: awake, alert , and oriented  Airway & Oxygen Therapy: Patient Spontanous Breathing and Patient connected to face mask oxygen  Post-op Assessment: Report given to RN and Post -op Vital signs reviewed and stable  Post vital signs: Reviewed and stable  Last Vitals:  Vitals Value Taken Time  BP 128/63 02/09/23 1100  Temp 36.5 C 02/09/23 1057  Pulse 76 02/09/23 1102  Resp 15 02/09/23 1102  SpO2 100 % 02/09/23 1102  Vitals shown include unvalidated device data.  Last Pain:  Vitals:   02/09/23 1100  TempSrc:   PainSc: Asleep      Patients Stated Pain Goal: 0 ([id number])  Complications: No notable events documented.

## 2023-02-09 NOTE — Interval H&P Note (Signed)
History and Physical Interval Note:  02/09/2023 9:00 AM  Traci Lynn  has presented today for surgery, with the diagnosis of K80.20 Cholelithiasis w/o cholecystitis K74.60 cirrhosis of liver w/o ascites, unspecified hepatic cirrhosis type.  The various methods of treatment have been discussed with the patient and family. After consideration of risks, benefits and other options for treatment, the patient has consented to  Procedure(s): XI ROBOTIC ASSISTED LAPAROSCOPIC CHOLECYSTECTOMY (N/A) [place] (ICG) (N/A) as a surgical intervention.  The patient's history has been reviewed, patient examined, no change in status, stable for surgery.  I have reviewed the patient's chart and labs.  Questions were answered to the patient's satisfaction.     Herbert Pun

## 2023-02-09 NOTE — Anesthesia Postprocedure Evaluation (Signed)
Anesthesia Post Note  Patient: Traci Lynn  Procedure(s) Performed: XI ROBOTIC ASSISTED LAPAROSCOPIC CHOLECYSTECTOMY (Abdomen) INDOCYANINE GREEN FLUORESCENCE IMAGING (ICG)  Patient location during evaluation: PACU Anesthesia Type: General Level of consciousness: awake and alert Pain management: pain level controlled Vital Signs Assessment: post-procedure vital signs reviewed and stable Respiratory status: spontaneous breathing, nonlabored ventilation, respiratory function stable and patient connected to nasal cannula oxygen Cardiovascular status: blood pressure returned to baseline and stable Postop Assessment: no apparent nausea or vomiting Anesthetic complications: no   No notable events documented.   Last Vitals:  Vitals:   02/09/23 1153 02/09/23 1155  BP: (!) 141/76 (!) 119/57  Pulse: 99 88  Resp: 18 11  Temp: 36.4 C   SpO2: 100% 96%    Last Pain:  Vitals:   02/09/23 1155  TempSrc:   PainSc: 1                  Martha Clan

## 2023-02-09 NOTE — Anesthesia Procedure Notes (Signed)
Procedure Name: Intubation Date/Time: 02/09/2023 9:39 AM  Performed by: Shainna Faux, [place], CRNAPre-anesthesia Checklist: Patient identified, Patient being monitored, Timeout performed, Emergency Drugs available and Suction available Patient Re-evaluated:Patient Re-evaluated prior to induction Oxygen Delivery Method: Circle system utilized Preoxygenation: Pre-oxygenation with 100% oxygen Induction Type: IV induction Ventilation: Mask ventilation without difficulty Laryngoscope Size: 3 and McGraph Grade View: Grade I Tube type: Oral Tube size: 7.0 mm Number of attempts: 1 Airway Equipment and Method: Stylet Placement Confirmation: ETT inserted through vocal cords under direct vision, positive ETCO2 and breath sounds checked- equal and bilateral Secured at: 22 cm Tube secured with: Tape Dental Injury: Teeth and Oropharynx as per pre-operative assessment

## 2023-02-09 NOTE — Anesthesia Preprocedure Evaluation (Signed)
Anesthesia Evaluation  Patient identified by MRN, date of birth, ID band Patient awake    Reviewed: Allergy & Precautions, NPO status , Patient's Chart, lab work & pertinent test results  History of Anesthesia Complications Negative for: history of anesthetic complications  Airway Mallampati: IV   Neck ROM: Full    Dental  (+) Dental Advidsory Given Crowns :   Pulmonary neg shortness of breath, asthma , neg sleep apnea, neg COPD, neg recent URI, former smoker   Pulmonary exam normal breath sounds clear to auscultation       Cardiovascular Exercise Tolerance: Good hypertension, (-) angina (-) Past MI and (-) Cardiac Stents + dysrhythmias (paroxysmal atrial tachycardia) Atrial Fibrillation (-) Valvular Problems/Murmurs Rhythm:Regular Rate:Normal  Stress echo 09/04/21:  Normal Stress Echocardiogram  NO VALVULAR STENOSIS NOTED    Neuro/Psych negative neurological ROS     GI/Hepatic ,GERD  ,,(+) Cirrhosis         Endo/Other  negative endocrine ROS    Renal/GU negative Renal ROS     Musculoskeletal   Abdominal   Peds  Hematology negative hematology ROS (+)   Anesthesia Other Findings Cardiology note 03/19/22:  72 y.o. female with  1. History of paroxysmal atrial tachycardia - followed by Dr. Saralyn Pilar  2. Chest pain with high risk for cardiac etiology   72 year old female with history of paroxysmal atrial tachycardia, on metoprolol tartrate, with infrequent episodes of tachycardia. 72-hour Holter monitor revealed predominant sinus rhythm with a mean heart rate of 70 bpm, with heart rate ranging from 46 to 120 bpm. There were rare premature ventricular contractions and rare premature atrial contractions, with one brief atrial run. The patient had no episodes of palpitations while wearing the monitor. Stress echocardiogram 09/04/2021 revealed normal left ventricular function, without evidence for scar or ischemia.  Plan    1. Continue current medications 2. Counseled patient about low-sodium diet 3. DASH diet printed instructions given to the patient 4. Advised patient to limit caffeine intake 5. Defer cardiac catheterization 6. Return to clinic for follow-up in 30-month  No orders of the defined types were placed in this encounter.  Return in about 6 months (around 09/18/2022).   Reproductive/Obstetrics                             Anesthesia Physical Anesthesia Plan  ASA: 3  Anesthesia Plan: General   Post-op Pain Management:    Induction: Intravenous  PONV Risk Score and Plan: 3 and Treatment may vary due to age or medical condition, Ondansetron, Dexamethasone and Midazolam  Airway Management Planned: Oral ETT  Additional Equipment:   Intra-op Plan:   Post-operative Plan: Extubation in OR  Informed Consent: I have reviewed the patients History and Physical, chart, labs and discussed the procedure including the risks, benefits and alternatives for the proposed anesthesia with the patient or authorized representative who has indicated his/her understanding and acceptance.       Plan Discussed with: CRNA  Anesthesia Plan Comments: (LMA/GETA backup discussed.  Patient consented for risks of anesthesia including but not limited to:  - adverse reactions to medications - damage to eyes, teeth, lips or other oral mucosa - nerve damage due to positioning  - sore throat or hoarseness - damage to heart, brain, nerves, lungs, other parts of body or loss of life  Informed patient about role of CRNA in peri- and intra-operative care.  Patient voiced understanding.)        Anesthesia  Quick Evaluation

## 2023-02-09 NOTE — Op Note (Signed)
Preoperative diagnosis: Cholelithiasis  Postoperative diagnosis: Same  Procedure: Robotic Assisted Laparoscopic Cholecystectomy.   Anesthesia: GETA   Surgeon: Dr. Windell Moment  Wound Classification: Clean Contaminated  Indications: Patient is a 72 y.o. female developed right upper quadrant pain and on workup was found to have cholelithiasis with a normal common duct. Robotic Assisted Laparoscopic cholecystectomy was elected.  Findings: Advance cirrhosis Splenomegay Critical view of safety achieved Cystic duct and artery identified, ligated and divided Adequate hemostasis  Description of procedure: The patient was placed on the operating table in the supine position. General anesthesia was induced. A time-out was completed verifying correct patient, procedure, site, positioning, and implant(s) and/or special equipment prior to beginning this procedure. An orogastric tube was placed. The abdomen was prepped and draped in the usual sterile fashion.  An incision was made in a natural skin line below the umbilicus.  The fascia was elevated and the Veress needle inserted. Proper position was confirmed by aspiration and saline meniscus test.  The abdomen was insufflated with carbon dioxide to a pressure of 15 mmHg. The patient tolerated insufflation well. A 8-mm trocar was then inserted in optiview fashion.  The laparoscope was inserted and the abdomen inspected. No injuries from initial trocar placement were noted. Additional trocars were then inserted in the following locations: an 8-mm trocar in the left lateral abdomen, and another two 8-mm trocars to the right side of the abdomen 5 cm appart. The umbilical trocar was changed to a 12 mm trocar all under direct visualization. The abdomen was inspected and no abnormalities were found. The table was placed in the reverse Trendelenburg position with the right side up. The robotic arms were docked and target anatomy identified. Instrument inserted  under direct visualization.  Filmy adhesions between the gallbladder and omentum, duodenum and transverse colon were lysed with electrocautery. The dome of the gallbladder was grasped with a prograsp and retracted over the dome of the liver. The infundibulum was also grasped with an atraumatic grasper and retracted toward the right lower quadrant. This maneuver exposed Calot's triangle. The peritoneum overlying the gallbladder infundibulum was then incised and the cystic duct and cystic artery identified and circumferentially dissected. Critical view of safety reviewed before ligating any structure. Firefly images taken to visualize biliary ducts. The cystic duct and cystic artery were then doubly clipped and divided close to the gallbladder.  The gallbladder was then dissected from its peritoneal attachments by electrocautery. Hemostasis was checked and the gallbladder and contained stones were removed using an endoscopic retrieval bag. The gallbladder was passed off the table as a specimen. The gallbladder fossa was irrigated with Vistaseal and hemostasis was obtained. There was no evidence of bleeding from the gallbladder fossa or cystic artery or leakage of the bile from the cystic duct stump. Secondary trocars were removed under direct vision. No bleeding was noted. The robotic arms were undoked. The scope was withdrawn and the umbilical trocar removed. The abdomen was allowed to collapse. The fascia of the 20m trocar sites was closed with figure-of-eight 0 vicryl sutures. The skin was closed with subcuticular sutures of 4-0 monocryl and topical skin adhesive. The orogastric tube was removed.  The patient tolerated the procedure well and was taken to the postanesthesia care unit in stable condition.   Specimen: Gallbladder  Complications: None  EBL: 5 mL

## 2023-02-09 NOTE — Discharge Instructions (Addendum)
  Diet: Resume home heart healthy regular diet.   Activity: No heavy lifting >20 pounds (children, pets, laundry, garbage) or strenuous activity until follow-up, but light activity and walking are encouraged. Do not drive or drink alcohol if taking narcotic pain medications.  Wound care: May shower with soapy water and pat dry (do not rub incisions), but no baths or submerging incision underwater until follow-up. (no swimming)   Medications: Resume all home medications. For mild to moderate pain:  ibuprofen (if no kidney disease). Combining Tylenol with alcohol can substantially increase your risk of causing liver disease. Narcotic pain medications, if prescribed, can be used for severe pain, though may cause nausea, constipation, and drowsiness.  If you do not need the narcotic pain medication, you do not need to fill the prescription.  Call office [phone]) at any time if any questions, worsening pain, fevers/chills, bleeding, drainage from incision site, or other concerns.    AMBULATORY SURGERY  DISCHARGE INSTRUCTIONS   The drugs that you were given will stay in your system until tomorrow so for the next 24 hours you should not:  Drive an automobile Make any legal decisions Drink any alcoholic beverage   You may resume regular meals tomorrow.  Today it is better to start with liquids and gradually work up to solid foods.  You may eat anything you prefer, but it is better to start with liquids, then soup and crackers, and gradually work up to solid foods.   Please notify your doctor immediately if you have any unusual bleeding, trouble breathing, redness and pain at the surgery site, drainage, fever, or pain not relieved by medication.    Additional Instructions:   Please contact your physician with any problems or Same Day Surgery at [phone], Monday through Friday 6 am to 4 pm, or  at [facility] number at [phone].

## 2023-02-10 LAB — SURGICAL PATHOLOGY

## 2023-02-13 DIAGNOSIS — D696 Thrombocytopenia, unspecified: Secondary | ICD-10-CM | POA: Diagnosis not present

## 2023-02-13 DIAGNOSIS — R350 Frequency of micturition: Secondary | ICD-10-CM | POA: Diagnosis not present

## 2023-02-13 DIAGNOSIS — M8589 Other specified disorders of bone density and structure, multiple sites: Secondary | ICD-10-CM | POA: Diagnosis not present

## 2023-02-20 DIAGNOSIS — Z0000 Encounter for general adult medical examination without abnormal findings: Secondary | ICD-10-CM | POA: Diagnosis not present

## 2023-02-20 DIAGNOSIS — K219 Gastro-esophageal reflux disease without esophagitis: Secondary | ICD-10-CM | POA: Diagnosis not present

## 2023-02-20 DIAGNOSIS — Z1331 Encounter for screening for depression: Secondary | ICD-10-CM | POA: Diagnosis not present

## 2023-02-20 DIAGNOSIS — M5416 Radiculopathy, lumbar region: Secondary | ICD-10-CM | POA: Diagnosis not present

## 2023-02-20 DIAGNOSIS — K7460 Unspecified cirrhosis of liver: Secondary | ICD-10-CM | POA: Diagnosis not present

## 2023-02-20 DIAGNOSIS — Z136 Encounter for screening for cardiovascular disorders: Secondary | ICD-10-CM | POA: Diagnosis not present

## 2023-02-20 DIAGNOSIS — D696 Thrombocytopenia, unspecified: Secondary | ICD-10-CM | POA: Diagnosis not present

## 2023-02-24 DIAGNOSIS — M48062 Spinal stenosis, lumbar region with neurogenic claudication: Secondary | ICD-10-CM | POA: Diagnosis not present

## 2023-02-24 DIAGNOSIS — M5416 Radiculopathy, lumbar region: Secondary | ICD-10-CM | POA: Diagnosis not present

## 2023-02-24 DIAGNOSIS — M5136 Other intervertebral disc degeneration, lumbar region: Secondary | ICD-10-CM | POA: Diagnosis not present

## 2023-03-02 DIAGNOSIS — K7460 Unspecified cirrhosis of liver: Secondary | ICD-10-CM | POA: Diagnosis not present

## 2023-03-02 DIAGNOSIS — D696 Thrombocytopenia, unspecified: Secondary | ICD-10-CM | POA: Diagnosis not present

## 2023-03-02 DIAGNOSIS — R350 Frequency of micturition: Secondary | ICD-10-CM | POA: Diagnosis not present

## 2023-03-02 DIAGNOSIS — R300 Dysuria: Secondary | ICD-10-CM | POA: Diagnosis not present

## 2023-03-02 DIAGNOSIS — K219 Gastro-esophageal reflux disease without esophagitis: Secondary | ICD-10-CM | POA: Diagnosis not present

## 2023-03-02 DIAGNOSIS — I480 Paroxysmal atrial fibrillation: Secondary | ICD-10-CM | POA: Diagnosis not present

## 2023-03-12 ENCOUNTER — Other Ambulatory Visit: Payer: Self-pay | Admitting: Family Medicine

## 2023-03-12 ENCOUNTER — Ambulatory Visit
Admission: RE | Admit: 2023-03-12 | Discharge: 2023-03-12 | Disposition: A | Discharge status: Home / Self Care | Payer: Medicare HMO | Source: Ambulatory Visit | Attending: Family Medicine | Admitting: Family Medicine

## 2023-03-12 DIAGNOSIS — R112 Nausea with vomiting, unspecified: Secondary | ICD-10-CM | POA: Diagnosis not present

## 2023-03-12 DIAGNOSIS — R1032 Left lower quadrant pain: Secondary | ICD-10-CM | POA: Diagnosis not present

## 2023-03-12 DIAGNOSIS — K7460 Unspecified cirrhosis of liver: Secondary | ICD-10-CM | POA: Diagnosis not present

## 2023-03-12 DIAGNOSIS — Z03818 Encounter for observation for suspected exposure to other biological agents ruled out: Secondary | ICD-10-CM | POA: Diagnosis not present

## 2023-03-12 DIAGNOSIS — R1011 Right upper quadrant pain: Secondary | ICD-10-CM | POA: Insufficient documentation

## 2023-03-12 DIAGNOSIS — R1033 Periumbilical pain: Secondary | ICD-10-CM | POA: Diagnosis not present

## 2023-03-12 DIAGNOSIS — R197 Diarrhea, unspecified: Secondary | ICD-10-CM | POA: Diagnosis not present

## 2023-03-12 DIAGNOSIS — R1031 Right lower quadrant pain: Secondary | ICD-10-CM | POA: Diagnosis not present

## 2023-03-12 MED ORDER — IOHEXOL 300 MG/ML  SOLN
100.0000 mL | Freq: Once | INTRAMUSCULAR | Status: AC | PRN
  Administered 2023-03-12: 100 mL via INTRAVENOUS

## 2023-03-13 DIAGNOSIS — R197 Diarrhea, unspecified: Secondary | ICD-10-CM | POA: Diagnosis not present

## 2023-03-19 DIAGNOSIS — R1011 Right upper quadrant pain: Secondary | ICD-10-CM | POA: Diagnosis not present

## 2023-03-19 DIAGNOSIS — R1031 Right lower quadrant pain: Secondary | ICD-10-CM | POA: Diagnosis not present

## 2023-03-19 DIAGNOSIS — R197 Diarrhea, unspecified: Secondary | ICD-10-CM | POA: Diagnosis not present

## 2023-03-19 DIAGNOSIS — R112 Nausea with vomiting, unspecified: Secondary | ICD-10-CM | POA: Diagnosis not present

## 2023-03-19 DIAGNOSIS — K5900 Constipation, unspecified: Secondary | ICD-10-CM | POA: Diagnosis not present

## 2023-03-23 DIAGNOSIS — R350 Frequency of micturition: Secondary | ICD-10-CM | POA: Diagnosis not present

## 2023-03-23 DIAGNOSIS — L578 Other skin changes due to chronic exposure to nonionizing radiation: Secondary | ICD-10-CM | POA: Diagnosis not present

## 2023-03-23 DIAGNOSIS — R000 Tachycardia, unspecified: Secondary | ICD-10-CM | POA: Diagnosis not present

## 2023-03-23 DIAGNOSIS — R300 Dysuria: Secondary | ICD-10-CM | POA: Diagnosis not present

## 2023-03-23 DIAGNOSIS — Z86018 Personal history of other benign neoplasm: Secondary | ICD-10-CM | POA: Diagnosis not present

## 2023-03-23 DIAGNOSIS — K219 Gastro-esophageal reflux disease without esophagitis: Secondary | ICD-10-CM | POA: Diagnosis not present

## 2023-03-23 DIAGNOSIS — D696 Thrombocytopenia, unspecified: Secondary | ICD-10-CM | POA: Diagnosis not present

## 2023-03-23 DIAGNOSIS — Z8679 Personal history of other diseases of the circulatory system: Secondary | ICD-10-CM | POA: Diagnosis not present

## 2023-03-23 DIAGNOSIS — I480 Paroxysmal atrial fibrillation: Secondary | ICD-10-CM | POA: Diagnosis not present

## 2023-03-23 DIAGNOSIS — K7460 Unspecified cirrhosis of liver: Secondary | ICD-10-CM | POA: Diagnosis not present

## 2023-05-01 DIAGNOSIS — D696 Thrombocytopenia, unspecified: Secondary | ICD-10-CM | POA: Diagnosis not present

## 2023-05-01 DIAGNOSIS — K219 Gastro-esophageal reflux disease without esophagitis: Secondary | ICD-10-CM | POA: Diagnosis not present

## 2023-05-01 DIAGNOSIS — K7460 Unspecified cirrhosis of liver: Secondary | ICD-10-CM | POA: Diagnosis not present

## 2023-05-01 DIAGNOSIS — I480 Paroxysmal atrial fibrillation: Secondary | ICD-10-CM | POA: Diagnosis not present

## 2023-05-07 DIAGNOSIS — Z87891 Personal history of nicotine dependence: Secondary | ICD-10-CM | POA: Diagnosis not present

## 2023-05-07 DIAGNOSIS — K7460 Unspecified cirrhosis of liver: Secondary | ICD-10-CM | POA: Diagnosis not present

## 2023-05-07 DIAGNOSIS — K5900 Constipation, unspecified: Secondary | ICD-10-CM | POA: Diagnosis not present

## 2023-06-17 DIAGNOSIS — K5900 Constipation, unspecified: Secondary | ICD-10-CM | POA: Diagnosis not present

## 2023-06-17 DIAGNOSIS — I480 Paroxysmal atrial fibrillation: Secondary | ICD-10-CM | POA: Diagnosis not present

## 2023-06-17 DIAGNOSIS — J40 Bronchitis, not specified as acute or chronic: Secondary | ICD-10-CM | POA: Diagnosis not present

## 2023-06-17 DIAGNOSIS — K7460 Unspecified cirrhosis of liver: Secondary | ICD-10-CM | POA: Diagnosis not present

## 2023-06-22 DIAGNOSIS — R932 Abnormal findings on diagnostic imaging of liver and biliary tract: Secondary | ICD-10-CM | POA: Diagnosis not present

## 2023-06-22 DIAGNOSIS — K7460 Unspecified cirrhosis of liver: Secondary | ICD-10-CM | POA: Diagnosis not present

## 2023-07-16 DIAGNOSIS — J40 Bronchitis, not specified as acute or chronic: Secondary | ICD-10-CM | POA: Diagnosis not present

## 2023-07-16 DIAGNOSIS — R053 Chronic cough: Secondary | ICD-10-CM | POA: Diagnosis not present

## 2023-07-16 DIAGNOSIS — J321 Chronic frontal sinusitis: Secondary | ICD-10-CM | POA: Diagnosis not present

## 2023-08-19 DIAGNOSIS — Z136 Encounter for screening for cardiovascular disorders: Secondary | ICD-10-CM | POA: Diagnosis not present

## 2023-08-19 DIAGNOSIS — D696 Thrombocytopenia, unspecified: Secondary | ICD-10-CM | POA: Diagnosis not present

## 2023-08-19 DIAGNOSIS — K7460 Unspecified cirrhosis of liver: Secondary | ICD-10-CM | POA: Diagnosis not present

## 2023-08-21 DIAGNOSIS — J40 Bronchitis, not specified as acute or chronic: Secondary | ICD-10-CM | POA: Diagnosis not present

## 2023-08-26 DIAGNOSIS — Z0000 Encounter for general adult medical examination without abnormal findings: Secondary | ICD-10-CM | POA: Diagnosis not present

## 2023-08-26 DIAGNOSIS — D696 Thrombocytopenia, unspecified: Secondary | ICD-10-CM | POA: Diagnosis not present

## 2023-08-26 DIAGNOSIS — J209 Acute bronchitis, unspecified: Secondary | ICD-10-CM | POA: Diagnosis not present

## 2023-09-22 DIAGNOSIS — R000 Tachycardia, unspecified: Secondary | ICD-10-CM | POA: Diagnosis not present

## 2023-09-22 DIAGNOSIS — Z8679 Personal history of other diseases of the circulatory system: Secondary | ICD-10-CM | POA: Diagnosis not present

## 2023-09-25 ENCOUNTER — Telehealth: Payer: Self-pay | Admitting: Oncology

## 2023-09-25 DIAGNOSIS — D696 Thrombocytopenia, unspecified: Secondary | ICD-10-CM | POA: Diagnosis not present

## 2023-09-25 NOTE — Telephone Encounter (Signed)
Pt stated she used to see Dr.Rao (looks like 2023) and she has been seeing Dr.Linthavong. Pt stated that Dr.L wants her to see Dr.Rao again.   Please advise on scheduling and call pt to schedule

## 2023-09-28 DIAGNOSIS — R051 Acute cough: Secondary | ICD-10-CM | POA: Diagnosis not present

## 2023-09-28 DIAGNOSIS — H903 Sensorineural hearing loss, bilateral: Secondary | ICD-10-CM | POA: Diagnosis not present

## 2023-09-28 DIAGNOSIS — H6123 Impacted cerumen, bilateral: Secondary | ICD-10-CM | POA: Diagnosis not present

## 2023-09-29 ENCOUNTER — Inpatient Hospital Stay: Payer: Medicare HMO | Attending: Oncology | Admitting: Oncology

## 2023-09-29 VITALS — BP 161/74 | HR 86 | Temp 97.2°F | Wt 171.3 lb

## 2023-09-29 DIAGNOSIS — K219 Gastro-esophageal reflux disease without esophagitis: Secondary | ICD-10-CM | POA: Insufficient documentation

## 2023-09-29 DIAGNOSIS — D693 Immune thrombocytopenic purpura: Secondary | ICD-10-CM

## 2023-09-29 DIAGNOSIS — Z87891 Personal history of nicotine dependence: Secondary | ICD-10-CM | POA: Diagnosis not present

## 2023-09-29 DIAGNOSIS — I10 Essential (primary) hypertension: Secondary | ICD-10-CM | POA: Diagnosis not present

## 2023-09-29 DIAGNOSIS — I700 Atherosclerosis of aorta: Secondary | ICD-10-CM | POA: Diagnosis not present

## 2023-09-29 DIAGNOSIS — K766 Portal hypertension: Secondary | ICD-10-CM | POA: Diagnosis not present

## 2023-09-29 DIAGNOSIS — Z860101 Personal history of adenomatous and serrated colon polyps: Secondary | ICD-10-CM | POA: Diagnosis not present

## 2023-09-29 DIAGNOSIS — D696 Thrombocytopenia, unspecified: Secondary | ICD-10-CM | POA: Insufficient documentation

## 2023-09-29 DIAGNOSIS — Z79899 Other long term (current) drug therapy: Secondary | ICD-10-CM | POA: Diagnosis not present

## 2023-09-29 DIAGNOSIS — K7460 Unspecified cirrhosis of liver: Secondary | ICD-10-CM | POA: Diagnosis not present

## 2023-09-29 DIAGNOSIS — I4719 Other supraventricular tachycardia: Secondary | ICD-10-CM | POA: Insufficient documentation

## 2023-09-29 DIAGNOSIS — K2270 Barrett's esophagus without dysplasia: Secondary | ICD-10-CM | POA: Diagnosis not present

## 2023-09-29 DIAGNOSIS — M129 Arthropathy, unspecified: Secondary | ICD-10-CM | POA: Diagnosis not present

## 2023-09-30 ENCOUNTER — Encounter: Payer: Self-pay | Admitting: Oncology

## 2023-09-30 NOTE — Progress Notes (Signed)
Hematology/Oncology Consult note [facility]  [phone] [phone]  Patient Care Team: Marisue Ivan, MD as PCP - General (Family Medicine) Marisue Ivan, MD (Family Medicine)   Name of the patient: Traci Lynn  [id number]  08/02/1951   Date of visit: 09/30/23  Diagnosis-thrombocytopenia likely secondary to cirrhosis versus ITP  Chief complaint/ Reason for visit-tablets follow-up for thrombocytopenia  Heme/Onc history: Patient is a 72 year old female with a past medical history significant for GERD, hypertension, Barrett's esophagus who has been referred for thrombocytopenia.  Most recent CBC from 09/17/2020 showed white count of 6.4, H&H of 14.4/42.2 and a platelet count of 113.  Looking back at her prior platelet counts in 2018 her platelet count was 147 and then 91 in April 2020 and 129 in October 2020.  She has had a normal white count and hemoglobin consistently.  Hepatitis C testing was negative.  CMP was normal except for mildly elevated alkaline phosphatase of 147.  Patient is doing well and denies any changes in her appetite or easy bruising.     Blood work from 10/02/2020 were as follows: CBC showed white count of 7, H&H of 14/40.2 and a platelet count of 117.  B12 folate and LDH were normal.  Smear review was unremarkable.  Interval history-patient is following up with [facility] for her history of cirrhosis.  She denies any bleeding or bruising.  She has not had any episodes of decompensated cirrhosis.  ECOG PS- 1 Pain scale- 0   Review of systems- Review of Systems  Constitutional:  Positive for malaise/fatigue. Negative for chills, fever and weight loss.  HENT:  Negative for congestion, ear discharge and nosebleeds.   Eyes:  Negative for blurred vision.  Respiratory:  Negative for cough, hemoptysis, sputum production, shortness of breath and wheezing.   Cardiovascular:  Negative for chest pain, palpitations,  orthopnea and claudication.  Gastrointestinal:  Negative for abdominal pain, blood in stool, constipation, diarrhea, heartburn, melena, nausea and vomiting.  Genitourinary:  Negative for dysuria, flank pain, frequency, hematuria and urgency.  Musculoskeletal:  Negative for back pain, joint pain and myalgias.  Skin:  Negative for rash.  Neurological:  Negative for dizziness, tingling, focal weakness, seizures, weakness and headaches.  Endo/Heme/Allergies:  Does not bruise/bleed easily.  Psychiatric/Behavioral:  Negative for depression and suicidal ideas. The patient does not have insomnia.       Allergies  Allergen Reactions   Pork-Derived Products Anaphylaxis   Penicillin V Potassium Hives, Rash and Other (See Comments)    Has patient had a PCN reaction causing immediate rash, facial/tongue/throat swelling, SOB or lightheadedness with hypotension: Yes Has patient had a PCN reaction causing severe rash involving mucus membranes or skin necrosis: Yes Has patient had a PCN reaction that required hospitalization: No Has patient had a PCN reaction occurring within the last 10 years: No If all of the above answers are "NO", then may proceed with Cephalosporin use.    Bee Venom Swelling   Shellfish Allergy Hives and Swelling     Past Medical History:  Diagnosis Date   Allergic genetic state    Aortic atherosclerosis (HCC)    Arthritis of both hands    Asthma    seasonal   Chronic ITP (idiopathic thrombocytopenia) (HCC)    a.) saw hematology 04/2022 - workup negative   Cirrhosis (HCC)    Diverticulosis    GERD (gastroesophageal reflux disease)    History of hepatitis A    Hypertension    Iron  overload    Lumbago    Palpitations    Paroxysmal atrial tachycardia    a.) stress echo 09/04/2021: EF >55%; met MPHR without symptoms; normal.   Peripheral edema    Pneumonia    as a infant   Portal hypertension (HCC)    Pre-diabetes    TMJ (temporomandibular joint disorder)    a.)  s/p TMJ arthroplasty   Tubular adenoma of colon      Past Surgical History:  Procedure Laterality Date   ABDOMINAL HYSTERECTOMY  1979   CARPAL TUNNEL RELEASE Left 1997   CATARACT EXTRACTION W/PHACO Right 04/15/2017   Procedure: CATARACT EXTRACTION PHACO AND INTRAOCULAR LENS PLACEMENT (IOC);  Surgeon: Sallee Lange, MD;  Location: [facility] ORS;  Service: Ophthalmology;  Laterality: Right;  Lot# [id number] H [place]: 01:02.8 AP%: 19.7 CDE: 25.43   CATARACT EXTRACTION W/PHACO Left 05/13/2017   Procedure: CATARACT EXTRACTION PHACO AND INTRAOCULAR LENS PLACEMENT (IOC);  Surgeon: Sallee Lange, MD;  Location: [facility] ORS;  Service: Ophthalmology;  Laterality: Left;  [place] 1:07.6 AP% 22.9 CDE 31.29 Fluid Pack lot # [id number]   COLONOSCOPY WITH PROPOFOL N/A 10/09/2017   Procedure: COLONOSCOPY WITH PROPOFOL;  Surgeon: Christena Deem, MD;  Location: [facility] ENDOSCOPY;  Service: Endoscopy;  Laterality: N/A;   COLONOSCOPY WITH PROPOFOL N/A 07/29/2022   Procedure: COLONOSCOPY WITH PROPOFOL;  Surgeon: Regis Bill, MD;  Location: [facility] ENDOSCOPY;  Service: Endoscopy;  Laterality: N/A;   ESOPHAGOGASTRODUODENOSCOPY (EGD) WITH PROPOFOL N/A 10/09/2017   Procedure: ESOPHAGOGASTRODUODENOSCOPY (EGD) WITH PROPOFOL;  Surgeon: Christena Deem, MD;  Location: [facility] ENDOSCOPY;  Service: Endoscopy;  Laterality: N/A;   ESOPHAGOGASTRODUODENOSCOPY (EGD) WITH PROPOFOL N/A 12/22/2017   Procedure: ESOPHAGOGASTRODUODENOSCOPY (EGD) WITH PROPOFOL;  Surgeon: Christena Deem, MD;  Location: [facility] ENDOSCOPY;  Service: Endoscopy;  Laterality: N/A;   ESOPHAGOGASTRODUODENOSCOPY (EGD) WITH PROPOFOL N/A 05/23/2020   Procedure: ESOPHAGOGASTRODUODENOSCOPY (EGD) WITH PROPOFOL;  Surgeon: Toledo, Boykin Nearing, MD;  Location: [facility] ENDOSCOPY;  Service: Gastroenterology;  Laterality: N/A;   EYE SURGERY     KNEE ARTHROSCOPY WITH MEDIAL MENISECTOMY Right 08/19/2019   Procedure: KNEE ARTHROSCOPY WITH PARTIAL MEDIAL MENISECTOMY,  PLICA  EXCISION;  Surgeon: Signa Kell, MD;  Location: [facility];  Service: Orthopedics;  Laterality: Right;   REFRACTIVE SURGERY     SHOULDER ARTHROSCOPY WITH OPEN ROTATOR CUFF REPAIR Left 09/24/2018   Procedure: SHOULDER ARTHROSCOPY WITH OPEN ROTATOR CUFF REPAIR;  Surgeon: Signa Kell, MD;  Location: [facility] ORS;  Service: Orthopedics;  Laterality: Left;   TMJ ARTHROPLASTY     TONSILLECTOMY AND ADENOIDECTOMY      Social History   Socioeconomic History   Marital status: Married    Spouse name: Sheranda Menk   Number of children: Not on file   Years of education: Not on file   Highest education level: Not on file  Occupational History   Occupation: Architect  Tobacco Use   Smoking status: Former    Current packs/day: 0.00    Types: Cigarettes    Quit date: 06/01/1971    Years since quitting: 52.3   Smokeless tobacco: Never  Vaping Use   Vaping status: Never Used  Substance and Sexual Activity   Alcohol use: No   Drug use: No   Sexual activity: Not Currently  Other Topics Concern   Not on file  Social History Narrative   Lives with husband, married 7 years.  She has one son.   Retired from [employer].    Highest level of education:  4 years of college  Social Determinants of Health   Financial Resource Strain: Low Risk  (08/26/2023)   Received from [facility]   Overall Financial Resource Strain (CARDIA)    Difficulty of Paying Living Expenses: Not hard at all  Food Insecurity: No Food Insecurity (08/26/2023)   Received from [facility]   Hunger Vital Sign    Worried About Running Out of Food in the Last Year: Never true    Ran Out of Food in the Last Year: Never true  Transportation Needs: No Transportation Needs (08/26/2023)   Received from [facility] - Transportation    In the past 12 months, has lack of transportation kept you from medical appointments or from getting medications?: No     Lack of Transportation (Non-Medical): No  Physical Activity: Not on file  Stress: Not on file  Social Connections: Not on file  Intimate Partner Violence: Not on file    Family History  Problem Relation Age of Onset   Kidney disease Father        Deceased, 66   Colon cancer Mother        Deceased 77   Healthy Son    Breast cancer Neg Hx      Current Outpatient Medications:    albuterol (PROVENTIL HFA;VENTOLIN HFA) 108 (90 Base) MCG/ACT inhaler, Inhale 1 puff into the lungs every 6 (six) hours as needed for wheezing or shortness of breath. , Disp: , Rfl:    Ascorbic Acid (VITAMIN C PO), Take 500 mg by mouth daily., Disp: , Rfl:    Calcium Carbonate-Vitamin D (CALCIUM 600+D PO), Take 1 tablet by mouth 2 (two) times daily. , Disp: , Rfl:    cetirizine (ZYRTEC) 10 MG tablet, Take 10 mg by mouth daily., Disp: , Rfl:    Cholecalciferol (VITAMIN D3) 5000 units CAPS, Take 5,000 Units by mouth daily., Disp: , Rfl:    EPINEPHrine (EPI-PEN) 0.3 mg/0.3 mL SOAJ injection, Inject 0.3 mg into the muscle once., Disp: , Rfl:    fluticasone (FLONASE) 50 MCG/ACT nasal spray, Place 1 spray into both nostrils daily as needed for allergies. , Disp: , Rfl:    folic acid (FOLVITE) 1 MG tablet, Take 1 mg by mouth daily., Disp: , Rfl:    meloxicam (MOBIC) 15 MG tablet, Take 15 mg by mouth as needed for pain., Disp: , Rfl:    metoprolol tartrate (LOPRESSOR) 25 MG tablet, Take 50 mg by mouth at bedtime., Disp: , Rfl:    pantoprazole (PROTONIX) 40 MG tablet, Take 40 mg by mouth daily., Disp: , Rfl:    VITAMIN E PO, Take 1 capsule by mouth daily., Disp: , Rfl:    baclofen (LIORESAL) 10 MG tablet, Take 10 mg by mouth daily. At bedtime, Disp: , Rfl:    oxyCODONE (ROXICODONE) 5 MG immediate release tablet, Take 1 tablet (5 mg total) by mouth every 4 (four) hours as needed for severe pain., Disp: 30 tablet, Rfl: 0   traMADol (ULTRAM) 50 MG tablet, Take 50 mg by mouth every 6 (six) hours as needed for moderate pain.,  Disp: , Rfl:   Physical exam:  Vitals:   09/29/23 1413  BP: (!) 161/74  Pulse: 86  Temp: (!) 97.2 F (36.2 C)  TempSrc: Tympanic  SpO2: 100%  Weight: 171 lb 4.8 oz (77.7 kg)   Physical Exam Cardiovascular:     Rate and Rhythm: Normal rate and regular rhythm.     Heart sounds: Normal  heart sounds.  Pulmonary:     Effort: Pulmonary effort is normal.     Breath sounds: Normal breath sounds.  Abdominal:     General: Bowel sounds are normal.     Palpations: Abdomen is soft.  Musculoskeletal:     Right lower leg: No edema.     Left lower leg: No edema.  Skin:    General: Skin is warm and dry.  Neurological:     Mental Status: She is alert and oriented to person, place, and time.         Latest Ref Rng & Units 09/20/2018    2:43 PM  CMP  Glucose 70 - 99 mg/dL 83   BUN 8 - 23 mg/dL 10   Creatinine 4.40 - 1.00 mg/dL 1.02   Sodium 725 - 366 mmol/L 141   Potassium 3.5 - 5.1 mmol/L 3.8   Chloride 98 - 111 mmol/L 105   CO2 22 - 32 mmol/L 30   Calcium 8.9 - 10.3 mg/dL 9.7       Latest Ref Rng & Units 04/11/2022    7:58 AM  CBC  WBC 4.0 - 10.5 K/uL 5.4   Hemoglobin 12.0 - 15.0 g/dL 44.0   Hematocrit 34.7 - 46.0 % 39.9   Platelets 150 - 400 K/uL 113      Assessment and plan- Patient is a 72 y.o. female with history of chronic thrombocytopenia likely cirrhosis versus ITP here for a routine follow-up  Patient's platelet count was fluctuating in the 100s up until May 2023.Her platelets drifted down to the 80s since this year and most recently on 09/25/2023 she was found to have a platelet count of 68.  White count and hemoglobin are presently normal.  CT abdomen in April 2024 showed features of cirrhosis as well as splenomegaly with a spleen size of 16.5 cm.  It is difficult to discern if the thrombocytopenia is secondary to cirrhosis versus ITP patient has had mild thrombocytopenia with a platelet count of 112 even in 2019.  Regardless at this time her platelet counts do not  warrant any intervention.  We will consider giving her a trial of steroid should her platelets drop down to less than 30.  Also given the fact that her white count and hemoglobin are normal I do not suspect primary bone marrow disorder at this time warranting a bone marrow biopsy.  I will repeat CBC with differential in 2 4 and 6 months and see her back in 6 months.  I will also check B12 and folate in 2 months   Visit Diagnosis 1. Chronic ITP (idiopathic thrombocytopenia) (HCC)      Dr. Owens Shark, MD, MPH [facility] [phone] 09/30/2023 8:44 AM

## 2023-10-05 DIAGNOSIS — I480 Paroxysmal atrial fibrillation: Secondary | ICD-10-CM | POA: Diagnosis not present

## 2023-10-05 DIAGNOSIS — J40 Bronchitis, not specified as acute or chronic: Secondary | ICD-10-CM | POA: Diagnosis not present

## 2023-11-10 DIAGNOSIS — K7460 Unspecified cirrhosis of liver: Secondary | ICD-10-CM | POA: Diagnosis not present

## 2023-11-27 ENCOUNTER — Inpatient Hospital Stay: Payer: Medicare HMO | Attending: Oncology

## 2023-11-27 DIAGNOSIS — D696 Thrombocytopenia, unspecified: Secondary | ICD-10-CM | POA: Insufficient documentation

## 2023-11-27 DIAGNOSIS — D693 Immune thrombocytopenic purpura: Secondary | ICD-10-CM

## 2023-11-27 LAB — CBC WITH DIFFERENTIAL/PLATELET
Abs Immature Granulocytes: 0.01 10*3/uL (ref 0.00–0.07)
Basophils Absolute: 0 10*3/uL (ref 0.0–0.1)
Basophils Relative: 0 %
Eosinophils Absolute: 0.2 10*3/uL (ref 0.0–0.5)
Eosinophils Relative: 3 %
HCT: 36.5 % (ref 36.0–46.0)
Hemoglobin: 12.9 g/dL (ref 12.0–15.0)
Immature Granulocytes: 0 %
Lymphocytes Relative: 30 %
Lymphs Abs: 1.5 10*3/uL (ref 0.7–4.0)
MCH: 33.1 pg (ref 26.0–34.0)
MCHC: 35.3 g/dL (ref 30.0–36.0)
MCV: 93.6 fL (ref 80.0–100.0)
Monocytes Absolute: 0.3 10*3/uL (ref 0.1–1.0)
Monocytes Relative: 6 %
Neutro Abs: 3.2 10*3/uL (ref 1.7–7.7)
Neutrophils Relative %: 61 %
Platelets: 68 10*3/uL — ABNORMAL LOW (ref 150–400)
RBC: 3.9 MIL/uL (ref 3.87–5.11)
RDW: 14.1 % (ref 11.5–15.5)
WBC: 5.2 10*3/uL (ref 4.0–10.5)
nRBC: 0 % (ref 0.0–0.2)

## 2023-11-27 LAB — VITAMIN B12: Vitamin B-12: 686 pg/mL (ref 180–914)

## 2023-11-27 LAB — FOLATE: Folate: 21 ng/mL (ref >5.9)

## 2023-12-16 DIAGNOSIS — K7460 Unspecified cirrhosis of liver: Secondary | ICD-10-CM | POA: Diagnosis not present

## 2023-12-21 DIAGNOSIS — M7531 Calcific tendinitis of right shoulder: Secondary | ICD-10-CM | POA: Diagnosis not present

## 2023-12-21 DIAGNOSIS — M7501 Adhesive capsulitis of right shoulder: Secondary | ICD-10-CM | POA: Diagnosis not present

## 2023-12-21 DIAGNOSIS — M25511 Pain in right shoulder: Secondary | ICD-10-CM | POA: Diagnosis not present

## 2023-12-21 DIAGNOSIS — M25311 Other instability, right shoulder: Secondary | ICD-10-CM | POA: Diagnosis not present

## 2023-12-23 ENCOUNTER — Other Ambulatory Visit: Payer: Self-pay | Admitting: Orthopedic Surgery

## 2023-12-23 DIAGNOSIS — M7501 Adhesive capsulitis of right shoulder: Secondary | ICD-10-CM

## 2023-12-23 DIAGNOSIS — M25311 Other instability, right shoulder: Secondary | ICD-10-CM

## 2023-12-23 DIAGNOSIS — M25511 Pain in right shoulder: Secondary | ICD-10-CM

## 2023-12-23 DIAGNOSIS — M7531 Calcific tendinitis of right shoulder: Secondary | ICD-10-CM

## 2024-01-05 DIAGNOSIS — I480 Paroxysmal atrial fibrillation: Secondary | ICD-10-CM | POA: Diagnosis not present

## 2024-01-05 DIAGNOSIS — K7460 Unspecified cirrhosis of liver: Secondary | ICD-10-CM | POA: Diagnosis not present

## 2024-01-05 DIAGNOSIS — D696 Thrombocytopenia, unspecified: Secondary | ICD-10-CM | POA: Diagnosis not present

## 2024-01-16 ENCOUNTER — Ambulatory Visit
Admission: RE | Admit: 2024-01-16 | Discharge: 2024-01-16 | Disposition: A | Discharge status: Home / Self Care | Payer: Medicare HMO | Source: Ambulatory Visit | Attending: Orthopedic Surgery | Admitting: Orthopedic Surgery

## 2024-01-16 DIAGNOSIS — M25511 Pain in right shoulder: Secondary | ICD-10-CM

## 2024-01-16 DIAGNOSIS — M75111 Incomplete rotator cuff tear or rupture of right shoulder, not specified as traumatic: Secondary | ICD-10-CM | POA: Diagnosis not present

## 2024-01-16 DIAGNOSIS — M7501 Adhesive capsulitis of right shoulder: Secondary | ICD-10-CM

## 2024-01-16 DIAGNOSIS — M25311 Other instability, right shoulder: Secondary | ICD-10-CM

## 2024-01-16 DIAGNOSIS — M7531 Calcific tendinitis of right shoulder: Secondary | ICD-10-CM

## 2024-01-20 ENCOUNTER — Ambulatory Visit
Admission: RE | Admit: 2024-01-20 | Discharge: 2024-01-20 | Disposition: A | Discharge status: Home / Self Care | Payer: Medicare HMO | Source: Ambulatory Visit | Attending: Physician Assistant | Admitting: Physician Assistant

## 2024-01-20 ENCOUNTER — Other Ambulatory Visit: Payer: Self-pay | Admitting: Physician Assistant

## 2024-01-20 DIAGNOSIS — R600 Localized edema: Secondary | ICD-10-CM | POA: Diagnosis not present

## 2024-01-20 DIAGNOSIS — K7460 Unspecified cirrhosis of liver: Secondary | ICD-10-CM | POA: Diagnosis not present

## 2024-01-29 ENCOUNTER — Inpatient Hospital Stay: Payer: Medicare HMO | Attending: Oncology

## 2024-01-29 DIAGNOSIS — D693 Immune thrombocytopenic purpura: Secondary | ICD-10-CM

## 2024-01-29 DIAGNOSIS — D696 Thrombocytopenia, unspecified: Secondary | ICD-10-CM | POA: Diagnosis not present

## 2024-01-29 LAB — CBC WITH DIFFERENTIAL/PLATELET
Abs Immature Granulocytes: 0.01 10*3/uL (ref 0.00–0.07)
Basophils Absolute: 0.1 10*3/uL (ref 0.0–0.1)
Basophils Relative: 1 %
Eosinophils Absolute: 0.3 10*3/uL (ref 0.0–0.5)
Eosinophils Relative: 4 %
HCT: 40.8 % (ref 36.0–46.0)
Hemoglobin: 14.7 g/dL (ref 12.0–15.0)
Immature Granulocytes: 0 %
Lymphocytes Relative: 34 %
Lymphs Abs: 2.7 10*3/uL (ref 0.7–4.0)
MCH: 33.9 pg (ref 26.0–34.0)
MCHC: 36 g/dL (ref 30.0–36.0)
MCV: 94.2 fL (ref 80.0–100.0)
Monocytes Absolute: 0.7 10*3/uL (ref 0.1–1.0)
Monocytes Relative: 9 %
Neutro Abs: 4.2 10*3/uL (ref 1.7–7.7)
Neutrophils Relative %: 52 %
Platelets: 100 10*3/uL — ABNORMAL LOW (ref 150–400)
RBC: 4.33 MIL/uL (ref 3.87–5.11)
RDW: 13.5 % (ref 11.5–15.5)
WBC: 7.9 10*3/uL (ref 4.0–10.5)
nRBC: 0 % (ref 0.0–0.2)

## 2024-02-01 DIAGNOSIS — Z0100 Encounter for examination of eyes and vision without abnormal findings: Secondary | ICD-10-CM | POA: Diagnosis not present

## 2024-02-01 DIAGNOSIS — Z961 Presence of intraocular lens: Secondary | ICD-10-CM | POA: Diagnosis not present

## 2024-02-11 DIAGNOSIS — M7531 Calcific tendinitis of right shoulder: Secondary | ICD-10-CM | POA: Diagnosis not present

## 2024-03-10 ENCOUNTER — Other Ambulatory Visit: Payer: Self-pay | Admitting: Orthopedic Surgery

## 2024-03-15 ENCOUNTER — Encounter: Payer: Self-pay | Admitting: Orthopedic Surgery

## 2024-03-17 DIAGNOSIS — Z79899 Other long term (current) drug therapy: Secondary | ICD-10-CM | POA: Diagnosis not present

## 2024-03-18 ENCOUNTER — Encounter: Payer: Self-pay | Admitting: Orthopedic Surgery

## 2024-03-18 NOTE — Anesthesia Preprocedure Evaluation (Addendum)
 Anesthesia Evaluation  Patient identified by MRN, date of birth, ID band Patient awake    Reviewed: Allergy & Precautions, H&P , NPO status , Patient's Chart, lab work & pertinent test results  Airway Mallampati: IV  TM Distance: <3 FB Neck ROM: Full  Mouth opening: Limited Mouth Opening Comment: Short TMD, likely anterior airway, plan [facility] no notable dental hx. (+) Caps Caps upper front central and lateral incisors, cap right lower premolar? None loose, none chipped:   Pulmonary asthma , pneumonia, former smoker   Pulmonary exam normal breath sounds clear to auscultation       Cardiovascular hypertension, Normal cardiovascular exam Rhythm:Regular Rate:Normal  Stress echocardiogram 09/04/2021, exercised 6 minutes on a Bruce protocol with mild chest discomfort without ECG changes. At baseline, 2D echocardiogram revealed normal left ventricular function, with LVEF greater than 55%. At peak exercise was appropriate augmentation of all myocardial segments, without evidence for scar or ischemia.  Hx PAT   Neuro/Psych negative neurological ROS  negative psych ROS   GI/Hepatic negative GI ROS, Neg liver ROS,GERD  ,,  Endo/Other  negative endocrine ROS    Renal/GU negative Renal ROS  negative genitourinary   Musculoskeletal negative musculoskeletal ROS (+) Arthritis ,    Abdominal   Peds negative pediatric ROS (+)  Hematology negative hematology ROS (+) Blood dyscrasia, anemia   Anesthesia Other Findings 09-22-23 office note: hx   Temporomandibular joint arthroplasty Paroxysmal atrial tachycardia (HCC)  Tubular adenoma of colon Paroxysmal atrial tachycardia (HCC)  Tubular adenoma of colon Lumbago  GERD (gastroesophageal reflux disease) Barretts esophagus Asthma  Allergic genetic state Hypertension  Cirrhosis (HCC) Iron overload  Pneumonia Pre-diabetes  History of hepatitis A Arthritis of both  hands Palpitations Peripheral edema  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 Discussed with Dr. Lydia Sams and patient. Patient states platelets have been as low as 53, to 110 in the past. Lovely lady, very personable. Former Designer, industrial/product.  Repeat platelets 81, so no block, patient and surgeon aware.   Reproductive/Obstetrics negative OB ROS                             Anesthesia Physical Anesthesia Plan  ASA: 3  Anesthesia Plan: General ETT   Post-op Pain Management: Regional block   Induction: Intravenous  PONV Risk Score and Plan:   Airway Management Planned: Oral ETT  Additional Equipment:   Intra-op Plan:   Post-operative Plan: Extubation in OR  Informed Consent: I have reviewed the patients History and Physical, chart, labs and discussed the procedure including the risks, benefits and alternatives for the proposed anesthesia with the patient or authorized representative who has indicated his/her understanding and acceptance.     Dental Advisory Given  Plan Discussed with: Anesthesiologist, CRNA and Surgeon  Anesthesia Plan Comments: (Patient consented for risks of anesthesia including but not limited to:  - adverse reactions to medications - damage to eyes, teeth, lips or other oral mucosa - nerve damage due to positioning  - sore throat or hoarseness - Damage to heart, brain, nerves, lungs, other parts of body or  loss of life  Patient voiced understanding and  assent.)       Anesthesia Quick Evaluation

## 2024-03-22 DIAGNOSIS — L578 Other skin changes due to chronic exposure to nonionizing radiation: Secondary | ICD-10-CM | POA: Diagnosis not present

## 2024-03-22 DIAGNOSIS — Z86018 Personal history of other benign neoplasm: Secondary | ICD-10-CM | POA: Diagnosis not present

## 2024-03-28 DIAGNOSIS — M7531 Calcific tendinitis of right shoulder: Secondary | ICD-10-CM | POA: Diagnosis not present

## 2024-03-29 ENCOUNTER — Inpatient Hospital Stay: Payer: Medicare HMO | Attending: Oncology

## 2024-03-29 ENCOUNTER — Encounter: Payer: Self-pay | Admitting: Oncology

## 2024-03-29 ENCOUNTER — Inpatient Hospital Stay: Payer: Medicare HMO | Admitting: Oncology

## 2024-03-29 VITALS — BP 115/57 | HR 63 | Temp 98.0°F | Resp 18 | Ht 64.0 in | Wt 168.0 lb

## 2024-03-29 DIAGNOSIS — R748 Abnormal levels of other serum enzymes: Secondary | ICD-10-CM | POA: Insufficient documentation

## 2024-03-29 DIAGNOSIS — D693 Immune thrombocytopenic purpura: Secondary | ICD-10-CM

## 2024-03-29 DIAGNOSIS — Z800 Family history of malignant neoplasm of digestive organs: Secondary | ICD-10-CM | POA: Insufficient documentation

## 2024-03-29 DIAGNOSIS — D696 Thrombocytopenia, unspecified: Secondary | ICD-10-CM | POA: Insufficient documentation

## 2024-03-29 DIAGNOSIS — K7460 Unspecified cirrhosis of liver: Secondary | ICD-10-CM | POA: Diagnosis not present

## 2024-03-29 DIAGNOSIS — Z87891 Personal history of nicotine dependence: Secondary | ICD-10-CM | POA: Diagnosis not present

## 2024-03-29 LAB — CBC WITH DIFFERENTIAL/PLATELET
Abs Immature Granulocytes: 0.01 10*3/uL (ref 0.00–0.07)
Basophils Absolute: 0 10*3/uL (ref 0.0–0.1)
Basophils Relative: 1 %
Eosinophils Absolute: 0.3 10*3/uL (ref 0.0–0.5)
Eosinophils Relative: 4 %
HCT: 37.2 % (ref 36.0–46.0)
Hemoglobin: 13.1 g/dL (ref 12.0–15.0)
Immature Granulocytes: 0 %
Lymphocytes Relative: 29 %
Lymphs Abs: 1.9 10*3/uL (ref 0.7–4.0)
MCH: 33.6 pg (ref 26.0–34.0)
MCHC: 35.2 g/dL (ref 30.0–36.0)
MCV: 95.4 fL (ref 80.0–100.0)
Monocytes Absolute: 0.5 10*3/uL (ref 0.1–1.0)
Monocytes Relative: 9 %
Neutro Abs: 3.7 10*3/uL (ref 1.7–7.7)
Neutrophils Relative %: 57 %
Platelets: 83 10*3/uL — ABNORMAL LOW (ref 150–400)
RBC: 3.9 MIL/uL (ref 3.87–5.11)
RDW: 13.1 % (ref 11.5–15.5)
WBC: 6.4 10*3/uL (ref 4.0–10.5)
nRBC: 0 % (ref 0.0–0.2)

## 2024-03-29 NOTE — Progress Notes (Signed)
 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  Most recent CBC from 09/17/2020 showed white count of 6.4, H&H of 14.4/42.2 and a platelet count of 113.  Looking back at her prior platelet counts in 2018 her platelet count was 147 and then 91 in April 2020 and 129 in October 2020.  She has had a normal white count and hemoglobin consistently.  Hepatitis C testing was negative.  CMP was normal except for mildly elevated alkaline phosphatase of 147.  Patient is doing well and denies any changes in her appetite or easy bruising.     Blood work from 10/02/2020 were as follows: CBC showed white count of 7, H&H of 14/40.2 and a platelet count of 117.  B12 folate and LDH were normal.  Smear review was unremarkable.  Interval history-she is doing well presently and denies any specific complaints at this time.  She is following up with [facility] as well.  No recent hospitalizations.  Denies any bleeding or bruising  ECOG PS- 1 Pain scale- 0   Review of systems- Review of Systems  Constitutional:  Negative for chills, fever, malaise/fatigue and weight loss.  HENT:  Negative for congestion, ear discharge and nosebleeds.   Eyes:  Negative for blurred vision.  Respiratory:  Negative for cough, hemoptysis, sputum production, shortness of breath and wheezing.   Cardiovascular:  Negative for chest pain,  palpitations, orthopnea and claudication.  Gastrointestinal:  Negative for abdominal pain, blood in stool, constipation, diarrhea, heartburn, melena, nausea and vomiting.  Genitourinary:  Negative for dysuria, flank pain, frequency, hematuria and urgency.  Musculoskeletal:  Negative for back pain, joint pain and myalgias.  Skin:  Negative for rash.  Neurological:  Negative for dizziness, tingling, focal weakness, seizures, weakness and headaches.  Endo/Heme/Allergies:  Does not bruise/bleed easily.  Psychiatric/Behavioral:  Negative for depression and suicidal ideas. The patient does not have insomnia.       Allergies  Allergen Reactions   Pork-Derived Products Anaphylaxis   Penicillin V Potassium Hives, Rash and Other (See Comments)    Has patient had a PCN reaction causing immediate rash, facial/tongue/throat swelling, SOB or lightheadedness with hypotension: Yes Has patient had a PCN reaction causing severe rash involving mucus membranes or skin necrosis: Yes Has patient had a PCN reaction that required hospitalization: No Has patient had a PCN reaction occurring within the last 10 years: No If all of the above answers are "NO", then may proceed with Cephalosporin use.    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   Paroxysmal atrial tachycardia (HCC)    Peripheral edema    Pneumonia    as a infant   Portal hypertension (HCC)    Pre-diabetes    Sensorineural hearing loss, bilateral    TMJ (temporomandibular joint disorder)    a.) s/p TMJ arthroplasty   Tubular adenoma of colon    Wears contact lenses    Left eye only     Past Surgical History:  Procedure Laterality Date   ABDOMINAL HYSTERECTOMY  1979   CARPAL TUNNEL RELEASE Left 1997   CATARACT EXTRACTION W/PHACO Right 04/15/2017   Procedure: CATARACT EXTRACTION PHACO AND INTRAOCULAR LENS PLACEMENT (IOC);  Surgeon: Dingeldein, Steven, MD;  Location: [facility] ORS;  Service: Ophthalmology;  Laterality: Right;  Lot# [id number] H US : 01:02.8 AP%: 19.7 CDE: 25.43   CATARACT EXTRACTION W/PHACO Left 05/13/2017   Procedure: CATARACT EXTRACTION PHACO AND INTRAOCULAR LENS PLACEMENT (IOC);  Surgeon: Dingeldein, Steven, MD;  Location: [facility] ORS;  Service: Ophthalmology;  Laterality: Left;  US  1:07.6 AP% 22.9 CDE 31.29 Fluid Pack lot # [id number]   COLONOSCOPY WITH PROPOFOL  N/A 10/09/2017   Procedure: COLONOSCOPY WITH PROPOFOL ;  Surgeon: Deveron Fly, MD;  Location: [facility] ENDOSCOPY;  Service: Endoscopy;  Laterality: N/A;   COLONOSCOPY WITH PROPOFOL  N/A 07/29/2022   Procedure: COLONOSCOPY WITH PROPOFOL ;  Surgeon: Shane Darling, MD;  Location: [facility] ENDOSCOPY;  Service: Endoscopy;  Laterality: N/A;   ESOPHAGOGASTRODUODENOSCOPY (EGD) WITH PROPOFOL  N/A 10/09/2017   Procedure: ESOPHAGOGASTRODUODENOSCOPY (EGD) WITH PROPOFOL ;  Surgeon: Deveron Fly, MD;  Location: [facility] ENDOSCOPY;  Service: Endoscopy;  Laterality: N/A;   ESOPHAGOGASTRODUODENOSCOPY (EGD) WITH PROPOFOL  N/A 12/22/2017   Procedure: ESOPHAGOGASTRODUODENOSCOPY (EGD) WITH PROPOFOL ;  Surgeon: Deveron Fly, MD;  Location: [facility] ENDOSCOPY;  Service: Endoscopy;  Laterality: N/A;    ESOPHAGOGASTRODUODENOSCOPY (EGD) WITH PROPOFOL  N/A 05/23/2020   Procedure: ESOPHAGOGASTRODUODENOSCOPY (EGD) WITH PROPOFOL ;  Surgeon: Toledo, Alphonsus Jeans, MD;  Location: [facility] ENDOSCOPY;  Service: Gastroenterology;  Laterality: N/A;   EYE SURGERY     KNEE ARTHROSCOPY WITH MEDIAL MENISECTOMY Right 08/19/2019   Procedure: KNEE ARTHROSCOPY WITH PARTIAL MEDIAL MENISECTOMY,  PLICA EXCISION;  Surgeon: Lorri Rota, MD;  Location: [facility];  Service: Orthopedics;  Laterality: Right;   REFRACTIVE SURGERY     SHOULDER ARTHROSCOPY WITH OPEN ROTATOR CUFF REPAIR Left 09/24/2018   Procedure: SHOULDER ARTHROSCOPY WITH OPEN ROTATOR CUFF REPAIR;  Surgeon: Lorri Rota, MD;  Location: [facility] ORS;  Service: Orthopedics;  Laterality: Left;   TMJ ARTHROPLASTY     TONSILLECTOMY AND ADENOIDECTOMY      Social History   Socioeconomic History   Marital status: Married    Spouse name: Shaaron Golliday   Number of children: Not on file   Years of education: Not on file   Highest education level: Not on file  Occupational History   Occupation: Architect  Tobacco Use   Smoking status: Former    Current packs/day: 0.00    Types: Cigarettes    Quit date: 06/01/1971    Years since quitting: 52.8   Smokeless tobacco: Never  Vaping Use   Vaping status: Never Used  Substance and Sexual Activity   Alcohol use: No   Drug use: No   Sexual activity: Not Currently  Other Topics Concern   Not on file  Social History Narrative   Lives with husband, married 7 years.  She has one son.   Retired from [employer].    Highest level of education:  4 years of college   Social Drivers of Health   Financial Resource Strain: Low Risk  (02/11/2024)   Received from [facility]   Overall Financial Resource Strain (CARDIA)    Difficulty of Paying Living Expenses: Not hard at all  Food Insecurity: No Food Insecurity (02/11/2024)   Received from [facility]   Hunger Vital Sign     Worried About Running Out of Food in the Last Year: Never true    Ran Out of Food in the Last Year: Never true  Transportation Needs: No Transportation Needs (02/11/2024)   Received from [facility]    In the past 12 months, has lack of transportation kept you from medical appointments or from getting medications?: No    Lack of Transportation (Non-Medical): No  Physical Activity: Not on file  Stress: Not on file  Social Connections: Not on file  Intimate Partner Violence: Not on file    Family History  Problem Relation Age of Onset   Kidney disease Father        Deceased, 82   Colon cancer Mother        Deceased 59   Healthy Son    Breast cancer Neg Hx      Current Outpatient Medications:    albuterol (PROVENTIL HFA;VENTOLIN HFA) 108 (90 Base) MCG/ACT inhaler, Inhale 1 puff into the lungs every 6 (six) hours as needed for wheezing or shortness of breath. , Disp: , Rfl:    Ascorbic Acid (VITAMIN C PO), Take 250 mg by mouth daily., Disp: , Rfl:    Calcium Carbonate-Vitamin D (CALCIUM 600+D PO), Take 1 tablet by mouth 2 (two) times daily. , Disp: , Rfl:    cetirizine (ZYRTEC) 10 MG tablet, Take 10 mg by mouth daily., Disp: , Rfl:    Cholecalciferol (VITAMIN D3) 5000 units CAPS, Take 2,000 Units by mouth daily., Disp: , Rfl:    diphenhydrAMINE (BENADRYL) 25 mg capsule, Take 25 mg by mouth every 6 (six) hours as needed., Disp: , Rfl:    EPINEPHrine  (EPI-PEN) 0.3 mg/0.3 mL SOAJ injection, Inject 0.3 mg into the muscle once., Disp: , Rfl:    fluticasone  (FLONASE ) 50 MCG/ACT nasal spray, Place 1 spray into both nostrils daily as needed for allergies. , Disp: , Rfl:    folic acid  (FOLVITE ) 1 MG tablet, Take 1 mg by mouth daily., Disp: , Rfl:    metoprolol tartrate (LOPRESSOR) 25 MG tablet, Take 25 mg by mouth 2 (two) times daily., Disp: , Rfl:    pantoprazole (PROTONIX) 40 MG tablet, Take 40 mg by mouth daily., Disp: , Rfl:    Probiotic Product  (PROBIOTIC DAILY PO), Take by mouth daily., Disp: , Rfl:    spironolactone (ALDACTONE) 25 MG tablet, Take 25 mg by mouth daily., Disp: , Rfl:    VITAMIN E PO, Take 1 capsule by mouth daily., Disp: , Rfl:   Physical exam:  Vitals:   03/29/24 0943  BP: (!) 115/57  Pulse: 63  Resp: 18  Temp: 98 F (36.7 C)  TempSrc: Tympanic  SpO2: 98%  Weight: 168 lb (76.2 kg)  Height: 5\' 4"  (1.626 m)   Physical Exam Cardiovascular:     Rate and Rhythm: Normal rate and regular rhythm.     Heart sounds: Normal heart sounds.  Pulmonary:     Effort: Pulmonary effort is normal.     Breath sounds: Normal breath sounds.  Skin:    General: Skin is warm and dry.  Neurological:     Mental Status: She is alert and oriented to person, place, and time.      I have personally reviewed labs listed below:    Latest Ref Rng & Units 09/20/2018    2:43 PM  CMP  Glucose 70 - 99 mg/dL 83   BUN 8 - 23 mg/dL 10   Creatinine 2.95 - 1.00 mg/dL 1.88   Sodium 416 - 606 mmol/L 141   Potassium 3.5 - 5.1 mmol/L 3.8   Chloride 98 - 111 mmol/L 105   CO2 22 - 32 mmol/L 30   Calcium 8.9 - 10.3 mg/dL 9.7       Latest Ref Rng & Units 03/29/2024    9:27 AM  CBC  WBC 4.0 - 10.5 K/uL 6.4   Hemoglobin 12.0 - 15.0 g/dL 30.1   Hematocrit 60.1 - 46.0 % 37.2   Platelets 150 - 400 K/uL 83      Assessment and plan- Patient is a 72 y.o. female referred for routine follow-up of thrombocytopenia  Thrombocytopenia likely secondary to cirrhosis versus possibly component of ITP.  Platelet counts have been fluctuating between 60s 200s over the last 2 to years.  White count and hemoglobin are normal.  This can be monitored conservatively without the need for bone marrow biopsy.  CBC with differential in 6 months in 1 year and I will see her back in 1 year   Visit Diagnosis 1. Thrombocytopenia (HCC)      Dr. Seretha Dance, MD, MPH [facility] at [facility] [phone] 03/29/2024 12:34 PM

## 2024-04-01 ENCOUNTER — Other Ambulatory Visit: Payer: Self-pay

## 2024-04-01 ENCOUNTER — Ambulatory Visit: Payer: Self-pay | Admitting: Anesthesiology

## 2024-04-01 ENCOUNTER — Encounter: Payer: Self-pay | Admitting: Orthopedic Surgery

## 2024-04-01 ENCOUNTER — Ambulatory Visit
Admission: RE | Admit: 2024-04-01 | Discharge: 2024-04-01 | Disposition: A | Discharge status: Home / Self Care | Attending: Orthopedic Surgery | Admitting: Orthopedic Surgery

## 2024-04-01 ENCOUNTER — Encounter
Admission: RE | Disposition: A | Discharge status: Home / Self Care | Payer: Self-pay | Source: Home / Self Care | Attending: Orthopedic Surgery

## 2024-04-01 DIAGNOSIS — M65811 Other synovitis and tenosynovitis, right shoulder: Secondary | ICD-10-CM | POA: Diagnosis not present

## 2024-04-01 DIAGNOSIS — M25811 Other specified joint disorders, right shoulder: Secondary | ICD-10-CM | POA: Diagnosis not present

## 2024-04-01 DIAGNOSIS — Z01812 Encounter for preprocedural laboratory examination: Secondary | ICD-10-CM

## 2024-04-01 DIAGNOSIS — M75101 Unspecified rotator cuff tear or rupture of right shoulder, not specified as traumatic: Secondary | ICD-10-CM | POA: Insufficient documentation

## 2024-04-01 DIAGNOSIS — M7541 Impingement syndrome of right shoulder: Secondary | ICD-10-CM | POA: Diagnosis not present

## 2024-04-01 DIAGNOSIS — K219 Gastro-esophageal reflux disease without esophagitis: Secondary | ICD-10-CM | POA: Insufficient documentation

## 2024-04-01 DIAGNOSIS — M7531 Calcific tendinitis of right shoulder: Secondary | ICD-10-CM | POA: Diagnosis not present

## 2024-04-01 DIAGNOSIS — J45909 Unspecified asthma, uncomplicated: Secondary | ICD-10-CM | POA: Insufficient documentation

## 2024-04-01 DIAGNOSIS — M7581 Other shoulder lesions, right shoulder: Secondary | ICD-10-CM | POA: Diagnosis not present

## 2024-04-01 DIAGNOSIS — Z87891 Personal history of nicotine dependence: Secondary | ICD-10-CM | POA: Insufficient documentation

## 2024-04-01 DIAGNOSIS — M75121 Complete rotator cuff tear or rupture of right shoulder, not specified as traumatic: Secondary | ICD-10-CM | POA: Diagnosis not present

## 2024-04-01 DIAGNOSIS — I10 Essential (primary) hypertension: Secondary | ICD-10-CM | POA: Diagnosis not present

## 2024-04-01 DIAGNOSIS — D473 Essential (hemorrhagic) thrombocythemia: Secondary | ICD-10-CM

## 2024-04-01 DIAGNOSIS — M7521 Bicipital tendinitis, right shoulder: Secondary | ICD-10-CM | POA: Diagnosis not present

## 2024-04-01 DIAGNOSIS — M19011 Primary osteoarthritis, right shoulder: Secondary | ICD-10-CM | POA: Insufficient documentation

## 2024-04-01 DIAGNOSIS — K7469 Other cirrhosis of liver: Secondary | ICD-10-CM

## 2024-04-01 DIAGNOSIS — I479 Paroxysmal tachycardia, unspecified: Secondary | ICD-10-CM | POA: Diagnosis not present

## 2024-04-01 HISTORY — DX: Barrett's esophagus without dysplasia: K22.70

## 2024-04-01 HISTORY — DX: Paroxysmal atrial fibrillation: I48.0

## 2024-04-01 HISTORY — DX: Sensorineural hearing loss, bilateral: H90.3

## 2024-04-01 HISTORY — DX: Unspecified cirrhosis of liver: K74.60

## 2024-04-01 HISTORY — PX: SHOULDER ARTHROSCOPY WITH OPEN ROTATOR CUFF REPAIR: SHX6092

## 2024-04-01 HISTORY — DX: Presence of spectacles and contact lenses: Z97.3

## 2024-04-01 LAB — CBC
HCT: 35.5 % — ABNORMAL LOW (ref 36.0–46.0)
Hemoglobin: 13.1 g/dL (ref 12.0–15.0)
MCH: 34 pg (ref 26.0–34.0)
MCHC: 36.9 g/dL — ABNORMAL HIGH (ref 30.0–36.0)
MCV: 92.2 fL (ref 80.0–100.0)
Platelets: 81 10*3/uL — ABNORMAL LOW (ref 150–400)
RBC: 3.85 MIL/uL — ABNORMAL LOW (ref 3.87–5.11)
RDW: 13.1 % (ref 11.5–15.5)
WBC: 6.2 10*3/uL (ref 4.0–10.5)
nRBC: 0 % (ref 0.0–0.2)

## 2024-04-01 LAB — BASIC METABOLIC PANEL WITH GFR
Anion gap: 8 (ref 5–15)
BUN: 14 mg/dL (ref 8–23)
CO2: 25 mmol/L (ref 22–32)
Calcium: 9.5 mg/dL (ref 8.9–10.3)
Chloride: 101 mmol/L (ref 98–111)
Creatinine, Ser: 0.7 mg/dL (ref 0.44–1.00)
GFR, Estimated: >60 mL/min (ref >60)
Glucose, Bld: 107 mg/dL — ABNORMAL HIGH (ref 70–99)
Potassium: 4.1 mmol/L (ref 3.5–5.1)
Sodium: 134 mmol/L — ABNORMAL LOW (ref 135–145)

## 2024-04-01 SURGERY — ARTHROSCOPY, SHOULDER WITH REPAIR, ROTATOR CUFF, OPEN
Anesthesia: General | Site: Shoulder | Laterality: Right

## 2024-04-01 MED ORDER — HYDROMORPHONE HCL 1 MG/ML IJ SOLN: 0.5000 mg | INTRAMUSCULAR | Status: DC | PRN

## 2024-04-01 MED ORDER — ACETAMINOPHEN 500 MG PO TABS: 1000.0000 mg | ORAL_TABLET | Freq: Three times a day (TID) | ORAL | 2 refills | Status: AC

## 2024-04-01 MED ORDER — FENTANYL CITRATE PF 50 MCG/ML IJ SOSY
50.0000 ug | PREFILLED_SYRINGE | INTRAMUSCULAR | Status: DC | PRN
  Administered 2024-04-01: 50 ug via INTRAVENOUS

## 2024-04-01 MED ORDER — SUGAMMADEX SODIUM 200 MG/2ML IV SOLN
INTRAVENOUS | Status: DC | PRN
  Administered 2024-04-01: 200 mg via INTRAVENOUS

## 2024-04-01 MED ORDER — ACETAMINOPHEN 10 MG/ML IV SOLN
INTRAVENOUS | Status: DC | PRN
Start: 2024-04-01 — End: 2024-04-01
  Administered 2024-04-01: 1000 mg via INTRAVENOUS

## 2024-04-01 MED ORDER — LACTATED RINGERS IR SOLN
Status: DC | PRN
  Administered 2024-04-01: 6000 mL
  Administered 2024-04-01: 3000 mL
  Administered 2024-04-01: 12000 mL
  Administered 2024-04-01 (×4): 3000 mL

## 2024-04-01 MED ORDER — ONDANSETRON HCL 4 MG/2ML IJ SOLN
INTRAMUSCULAR | Status: DC | PRN
  Administered 2024-04-01: 4 mg via INTRAVENOUS

## 2024-04-01 MED ORDER — OXYCODONE HCL 5 MG PO TABS: 5.0000 mg | ORAL_TABLET | ORAL | 0 refills | Status: DC | PRN

## 2024-04-01 MED ORDER — FENTANYL CITRATE (PF) 100 MCG/2ML IJ SOLN
INTRAMUSCULAR | Status: AC
  Filled 2024-04-01: qty 2

## 2024-04-01 MED ORDER — OXYCODONE HCL 5 MG PO TABS
10.0000 mg | ORAL_TABLET | Freq: Once | ORAL | Status: AC
  Administered 2024-04-01: 10 mg via ORAL

## 2024-04-01 MED ORDER — PHENYLEPHRINE HCL (PRESSORS) 10 MG/ML IV SOLN
INTRAVENOUS | Status: DC | PRN
  Administered 2024-04-01: 80 ug via INTRAVENOUS

## 2024-04-01 MED ORDER — ONDANSETRON HCL 4 MG/2ML IJ SOLN
INTRAMUSCULAR | Status: AC
  Filled 2024-04-01: qty 2

## 2024-04-01 MED ORDER — GLYCOPYRROLATE 0.2 MG/ML IJ SOLN
INTRAMUSCULAR | Status: DC | PRN
  Administered 2024-04-01 (×2): .1 mg via INTRAVENOUS

## 2024-04-01 MED ORDER — BUPIVACAINE HCL 0.25 % IJ SOLN
INTRAMUSCULAR | Status: AC
  Filled 2024-04-01: qty 1

## 2024-04-01 MED ORDER — ROCURONIUM BROMIDE 10 MG/ML (PF) SYRINGE
PREFILLED_SYRINGE | INTRAVENOUS | Status: AC
  Filled 2024-04-01: qty 10

## 2024-04-01 MED ORDER — HYDROMORPHONE HCL 1 MG/ML IJ SOLN
INTRAMUSCULAR | Status: AC
  Filled 2024-04-01: qty 0.5

## 2024-04-01 MED ORDER — BUPIVACAINE LIPOSOME 1.3 % IJ SUSP
INTRAMUSCULAR | Status: DC | PRN
  Administered 2024-04-01: 10 mL

## 2024-04-01 MED ORDER — PROPOFOL 10 MG/ML IV BOLUS
INTRAVENOUS | Status: AC
  Filled 2024-04-01: qty 20

## 2024-04-01 MED ORDER — ACETAMINOPHEN 10 MG/ML IV SOLN
INTRAVENOUS | Status: AC
  Filled 2024-04-01: qty 100

## 2024-04-01 MED ORDER — LIDOCAINE HCL (CARDIAC) PF 100 MG/5ML IV SOSY
PREFILLED_SYRINGE | INTRAVENOUS | Status: DC | PRN
  Administered 2024-04-01: 100 mg via INTRAVENOUS

## 2024-04-01 MED ORDER — LACTATED RINGERS IV SOLN: INTRAVENOUS | Status: DC

## 2024-04-01 MED ORDER — PHENYLEPHRINE 80 MCG/ML (10ML) SYRINGE FOR IV PUSH (FOR BLOOD PRESSURE SUPPORT)
PREFILLED_SYRINGE | INTRAVENOUS | Status: AC
  Filled 2024-04-01: qty 10

## 2024-04-01 MED ORDER — HYDROMORPHONE HCL 1 MG/ML IJ SOLN
INTRAMUSCULAR | Status: DC | PRN
  Administered 2024-04-01: .5 mg via INTRAVENOUS

## 2024-04-01 MED ORDER — EPHEDRINE SULFATE (PRESSORS) 50 MG/ML IJ SOLN
INTRAMUSCULAR | Status: DC | PRN
  Administered 2024-04-01 (×3): 5 mg via INTRAVENOUS

## 2024-04-01 MED ORDER — ONDANSETRON 4 MG PO TBDP: 4.0000 mg | ORAL_TABLET | Freq: Three times a day (TID) | ORAL | 0 refills | Status: DC | PRN

## 2024-04-01 MED ORDER — BUPIVACAINE HCL (PF) 0.5 % IJ SOLN
INTRAMUSCULAR | Status: DC | PRN
  Administered 2024-04-01: 10 mL

## 2024-04-01 MED ORDER — ASPIRIN 325 MG PO TBEC
325.0000 mg | DELAYED_RELEASE_TABLET | Freq: Every day | ORAL | 0 refills | Status: AC
Start: 2024-04-01 — End: 2024-04-15

## 2024-04-01 MED ORDER — BUPIVACAINE LIPOSOME 1.3 % IJ SUSP
INTRAMUSCULAR | Status: AC
  Filled 2024-04-01: qty 10

## 2024-04-01 MED ORDER — DEXAMETHASONE SODIUM PHOSPHATE 10 MG/ML IJ SOLN
INTRAMUSCULAR | Status: AC
  Filled 2024-04-01: qty 1

## 2024-04-01 MED ORDER — DEXAMETHASONE SODIUM PHOSPHATE 4 MG/ML IJ SOLN
INTRAMUSCULAR | Status: DC | PRN
  Administered 2024-04-01: 4 mg via INTRAVENOUS

## 2024-04-01 MED ORDER — CEFAZOLIN SODIUM-DEXTROSE 2-4 GM/100ML-% IV SOLN
2.0000 g | INTRAVENOUS | Status: AC
  Administered 2024-04-01: 2 g via INTRAVENOUS

## 2024-04-01 MED ORDER — DEXAMETHASONE SODIUM PHOSPHATE 4 MG/ML IJ SOLN
INTRAMUSCULAR | Status: AC
  Filled 2024-04-01: qty 1

## 2024-04-01 MED ORDER — CEFAZOLIN SODIUM-DEXTROSE 2-3 GM-%(50ML) IV SOLR
INTRAVENOUS | Status: AC
  Filled 2024-04-01: qty 50

## 2024-04-01 MED ORDER — SUGAMMADEX SODIUM 200 MG/2ML IV SOLN
INTRAVENOUS | Status: AC
  Filled 2024-04-01: qty 2

## 2024-04-01 MED ORDER — MIDAZOLAM HCL 2 MG/2ML IJ SOLN
INTRAMUSCULAR | Status: AC
  Filled 2024-04-01: qty 2

## 2024-04-01 MED ORDER — EPHEDRINE 5 MG/ML INJ
INTRAVENOUS | Status: AC
  Filled 2024-04-01: qty 5

## 2024-04-01 MED ORDER — GLYCOPYRROLATE 0.2 MG/ML IJ SOLN
INTRAMUSCULAR | Status: AC
  Filled 2024-04-01: qty 1

## 2024-04-01 MED ORDER — FENTANYL CITRATE (PF) 100 MCG/2ML IJ SOLN
INTRAMUSCULAR | Status: DC | PRN
Start: 2024-04-01 — End: 2024-04-01
  Administered 2024-04-01: 100 ug via INTRAVENOUS

## 2024-04-01 MED ORDER — LIDOCAINE HCL (PF) 2 % IJ SOLN
INTRAMUSCULAR | Status: AC
  Filled 2024-04-01: qty 5

## 2024-04-01 MED ORDER — PROPOFOL 10 MG/ML IV BOLUS
INTRAVENOUS | Status: DC | PRN
  Administered 2024-04-01: 200 mg via INTRAVENOUS

## 2024-04-01 MED ORDER — ROCURONIUM BROMIDE 100 MG/10ML IV SOLN
INTRAVENOUS | Status: DC | PRN
Start: 2024-04-01 — End: 2024-04-01
  Administered 2024-04-01: 60 mg via INTRAVENOUS

## 2024-04-01 MED ORDER — PHENYLEPHRINE HCL-NACL 20-0.9 MG/250ML-% IV SOLN
INTRAVENOUS | Status: DC | PRN
  Administered 2024-04-01: 10 ug/min via INTRAVENOUS

## 2024-04-01 MED ORDER — LACTATED RINGERS IV SOLN
INTRAVENOUS | Status: DC | PRN
  Administered 2024-04-01: 4 mL

## 2024-04-01 MED ORDER — OXYCODONE HCL 5 MG PO TABS
ORAL_TABLET | ORAL | Status: AC
  Filled 2024-04-01: qty 2

## 2024-04-01 MED ORDER — LIDOCAINE HCL (PF) 2 % IJ SOLN
INTRAMUSCULAR | Status: AC
  Filled 2024-04-01: qty 2

## 2024-04-01 SURGICAL SUPPLY — 41 items
ANCHOR SUT BIO SW 4.75X19.1 (Anchor) IMPLANT
ANCHOR SWIVELOCK BIO 4.75X19.1 (Anchor) IMPLANT
BLADE SHAVER 4.5X7 STR FR (MISCELLANEOUS) ×1 IMPLANT
BUR BR 5.5 WIDE MOUTH (BURR) ×1 IMPLANT
CANNULA PART THRD DISP 5.75X7 (CANNULA) ×1 IMPLANT
CANNULA PARTIAL THREAD 2X7 (CANNULA) ×1 IMPLANT
CANNULA TWIST IN 8.25X7CM (CANNULA) IMPLANT
CHLORAPREP W/TINT 26 (MISCELLANEOUS) ×1 IMPLANT
COOLER POLAR GLACIER W/PUMP (MISCELLANEOUS) ×1 IMPLANT
COVER LIGHT HANDLE UNIVERSAL (MISCELLANEOUS) ×2 IMPLANT
DRAPE STERI 35X30 U-POUCH (DRAPES) IMPLANT
DRSG TEGADERM 4X4.75 (GAUZE/BANDAGES/DRESSINGS) ×3 IMPLANT
ELECTRODE REM PT RTRN 9FT ADLT (ELECTROSURGICAL) ×1 IMPLANT
GAUZE SPONGE 4X4 12PLY STRL (GAUZE/BANDAGES/DRESSINGS) ×1 IMPLANT
GAUZE XEROFORM 1X8 LF (GAUZE/BANDAGES/DRESSINGS) ×1 IMPLANT
GLOVE BIOGEL PI IND STRL 8 (GLOVE) ×2 IMPLANT
GLOVE SURG SS PI 7.5 STRL IVOR (GLOVE) ×3 IMPLANT
GLOVE SURG SYN 8.0 (GLOVE) ×1 IMPLANT
GLOVE SURG SYN 8.0 PF PI (GLOVE) ×1 IMPLANT
GOWN STRL REIN 2XL XLG LVL4 (GOWN DISPOSABLE) ×1 IMPLANT
GOWN STRL REUS W/ TWL LRG LVL3 (GOWN DISPOSABLE) ×3 IMPLANT
IV LR IRRIG 3000ML ARTHROMATIC (IV SOLUTION) ×4 IMPLANT
KIT STABILIZATION SHOULDER (MISCELLANEOUS) ×1 IMPLANT
KIT TURNOVER KIT A (KITS) ×1 IMPLANT
MANIFOLD NEPTUNE II (INSTRUMENTS) ×1 IMPLANT
MASK FACE SPIDER DISP (MASK) ×1 IMPLANT
MAT ABSORB FLUID 56X50 GRAY (MISCELLANEOUS) ×2 IMPLANT
PACK ARTHROSCOPY SHOULDER (MISCELLANEOUS) ×1 IMPLANT
PAD ABD DERMACEA PRESS 5X9 (GAUZE/BANDAGES/DRESSINGS) ×2 IMPLANT
PAD WRAPON POLAR SHDR XLG (MISCELLANEOUS) ×1 IMPLANT
PASSER SUT FIRSTPASS SELF (INSTRUMENTS) IMPLANT
SET Y ADAPTER MULIT-BAG IRRIG (MISCELLANEOUS) ×2 IMPLANT
SUT ETHILON 3-0 (SUTURE) ×1 IMPLANT
SUTURE TAPE 1.3 40 TPR END (SUTURE) IMPLANT
SUTURETAPE 1.3 40 W/NDL BLK/WH (SUTURE) IMPLANT
SUTURETAPE 1.3 40 W/NDL BLUE (SUTURE) IMPLANT
SYSTEM IMPL TENODESIS LNT 2.9 (Orthopedic Implant) IMPLANT
TUBE SET DOUBLEFLO INFLOW (TUBING) ×1 IMPLANT
TUBING CONNECTING 10 (TUBING) ×1 IMPLANT
TUBING OUTFLOW SET DBLFO PUMP (TUBING) ×1 IMPLANT
WAND WEREWOLF FLOW 90D (MISCELLANEOUS) ×1 IMPLANT

## 2024-04-01 NOTE — Transfer of Care (Signed)
 Immediate Anesthesia Transfer of Care Note  Patient: Traci Lynn  Procedure(s) Performed: ARTHROSCOPY, SHOULDER WITH REPAIR, ROTATOR CUFF, OPEN (Right: Shoulder)  Patient Location: PACU  Anesthesia Type: General ETT  Level of Consciousness: awake, alert  and patient cooperative  Airway and Oxygen Therapy: Patient Spontanous Breathing and Patient connected to supplemental oxygen  Post-op Assessment: Post-op Vital signs reviewed, Patient's Cardiovascular Status Stable, Respiratory Function Stable, Patent Airway and No signs of Nausea or vomiting  Post-op Vital Signs: Reviewed and stable  Complications: No notable events documented.

## 2024-04-01 NOTE — Discharge Instructions (Signed)

## 2024-04-01 NOTE — Anesthesia Procedure Notes (Signed)
 Procedure Name: Intubation Date/Time: 04/01/2024 8:07 AM  Performed by: Juanda Noon, CRNAPre-anesthesia Checklist: Patient identified, Patient being monitored, Timeout performed, Emergency Drugs available and Suction available Patient Re-evaluated:Patient Re-evaluated prior to induction Oxygen Delivery Method: Circle system utilized Preoxygenation: Pre-oxygenation with 100% oxygen Induction Type: IV induction Ventilation: Mask ventilation without difficulty Laryngoscope Size: Mac, McGrath and 4 Grade View: Grade II Tube type: Oral Tube size: 7.0 mm Number of attempts: 1 Airway Equipment and Method: Stylet Placement Confirmation: ETT inserted through vocal cords under direct vision, positive ETCO2 and breath sounds checked- equal and bilateral Secured at: 21 cm Tube secured with: Tape Dental Injury: Teeth and Oropharynx as per pre-operative assessment

## 2024-04-01 NOTE — H&P (Signed)
 Paper H&P to be scanned into permanent record. H&P reviewed. No significant changes noted.

## 2024-04-01 NOTE — Anesthesia Postprocedure Evaluation (Signed)
 Anesthesia Post Note  Patient: [name]  Procedure(s) Performed: ARTHROSCOPY, SHOULDER WITH REPAIR, ROTATOR CUFF, OPEN (Right: Shoulder)  Patient location during evaluation: PACU Anesthesia Type: General Level of consciousness: awake and alert Pain management: pain level controlled Vital Signs Assessment: post-procedure vital signs reviewed and stable Respiratory status: spontaneous breathing, nonlabored ventilation, respiratory function stable and patient connected to nasal cannula oxygen Cardiovascular status: blood pressure returned to baseline and stable Postop Assessment: no apparent nausea or vomiting Anesthetic complications: no Comments: Patient doing great; left smiling in wheelchair.     No notable events documented.   Last Vitals:  Vitals:   04/01/24 1145 04/01/24 1200  BP: (!) 129/57 134/63  Pulse: 63 65  Resp: 10 11  Temp:  (!) 36.3 C  SpO2: 95% 98%    Last Pain:  Vitals:   04/01/24 1200  PainSc: 7                  Cohan Stipes C Evangelyn Crouse

## 2024-04-01 NOTE — Op Note (Signed)
 SURGERY DATE: 04/01/2024   PRE-OP DIAGNOSIS:  1. Right rotator cuff tear 2. Right subacromial impingement 3. Right biceps tendinopathy 4. Right acromioclavicular joint arthritis   POST-OP DIAGNOSIS: 1. Right rotator cuff tear 2. Right subacromial impingement 3. Right biceps tendinopathy 4. Right acromioclavicular joint arthritis   PROCEDURES:  1. Right arthroscopic rotator cuff repair (upper border subscapularis + supraspinatus musculotendinous junction) 2. Right arthroscopic biceps tenodesis 3. Right arthroscopic extensive debridement of shoulder (glenohumeral and subacromial spaces) 4. Right arthroscopic subacromial decompression 5. Right arthroscopic distal clavicle excision   SURGEON: Cleotilde Dago, MD   ASSISTANT: Marine Sia, PA   ANESTHESIA: Gen with Exparel  interscalene block   ESTIMATED BLOOD LOSS: minimal   DRAINS:  none   TOTAL IV FLUIDS: per anesthesia      SPECIMENS: none   IMPLANTS: - Arthrex 4.14mm SwiveLock x 5 - Arthrex 2.74mm PushLock anchor x 1     OPERATIVE FINDINGS:  Examination under anesthesia: A careful examination under anesthesia was performed.  Passive range of motion was: FF: 150; ER at side: 80; ER in abduction: 110; IR in abduction: 50.  Anterior load shift: NT.  Posterior load shift: NT.  Sulcus in neutral: NT.  Sulcus in ER: NT.     Intra-operative findings: A thorough arthroscopic examination of the shoulder was performed.  The findings are: 1. Biceps tendon: significant tendinopathy 2. Superior labrum: injected with surrounding synovitis 3. Posterior labrum and capsule: degenerative 4. Inferior capsule and inferior recess: normal 5. Glenoid cartilage surface: Normal 6. Supraspinatus attachment: full-thickness tear at the musculotendinous junction 7. Posterior rotator cuff attachment: Normal 8. Humeral head articular cartilage: normal 9. Rotator interval: significant synovitis 10: Subscapularis tendon: partia-thickness upper  border tear 11. Anterior labrum: degenerative 12. IGHL: normal   OPERATIVE REPORT:    Indications for procedure:  Traci Lynn is a 73 y.o. female with an approximately 6 months of shoulder pain. She has had difficulty lifting arm overhead since that time. Clinical exam and MRI were suggestive of rotator cuff tear with calcific tendinitis; subacromial impingement; AC joint arthritis; and biceps tendinopathy. She had failed nonoperative management. Given these findings, we decided to proceed with surgical management. After discussion of risks, benefits, and alternatives to surgery, the patient elected to proceed.    Procedure in detail:   I identified Traci Lynn the pre-operative holding area.  I marked the operative shoulder with my initials. I reviewed the risks and benefits of the proposed surgical intervention, and the patient (and/or patient's guardian) wished to proceed.  Anesthesia was then performed with an Exparel  interscalene block.  The patient was transferred to the operative suite and placed in the beach chair position.     SCDs were placed on the lower extremities. Appropriate IV antibiotics were administered prior to incision. The operative upper extremity was then prepped and draped in standard fashion. A time out was performed confirming the correct extremity, correct patient, and correct procedure.    I then created a standard posterior portal with an 11 blade. The glenohumeral joint was easily entered with a blunt trocar and the arthroscope introduced. The findings of diagnostic arthroscopy are described above.  A standard anterior portal was made.  I debrided degenerative tissue including the synovitic tissue about the rotator interval as well as the superior labrum and posterior labrum.  This was performed with an oscillating shaver. I then coagulated the inflamed synovium to obtain hemostasis and reduce the risk of post-operative swelling using an Arthrocare  radiofrequency  device.  I then turned my attention to the arthroscopic biceps tenodesis. The Loop n Tack technique was used to pass a FiberTape through the biceps in a locked fashion adjacent to the biceps anchor.  A hole for a 2.9 mm Arthrex PushLock was drilled in the bicipital groove just superior to the subscapularis tendon insertion.  The biceps tendon was then cut and the biceps anchor complex was debrided down to a stable base on the superior labrum.  The FiberTape was loaded onto the PushLock anchor and impacted into place into the previously drilled hole in the bicipital groove.  This appropriately secured the biceps into the bicipital groove and took it off of tension.   The subscapularis tear was identified.  A superior anterolateral portal was made under needle localization.  A cannula was placed.  The comma tissue indicating the superolateral border of the subscapularis was identified readily.  The tip of the coracoid as well as the conjoined tendon and coracoacromial ligaments were visualized after debriding rotator interval tissue.  Tissue about the subscapularis was released anteriorly, superiorly, and posteriorly to allow for improved mobilization.  The lesser tuberosity footprint was prepared with a combination of electrocautery and burr. 2 suture tapes were passed in a mattress fashion.  All 4 strands of suture were then loaded onto a 4.75 mm SwiveLock anchor and placed into the prepared footprint with the arm in a neutral position.  This construct appropriately reduced the subscapularis tear.  The arm was then internally and externally rotated and the subscapularis was noted to move appropriately with rotation.  The remainder of the suture was then cut.   Next, the arthroscope was then introduced into the subacromial space.  An extensive subacromial bursectomy and debridement was performed using a combination of the shaver and Arthrocare wand. The entire acromial undersurface was exposed  and the CA ligament was subperiosteally elevated to expose the anterior acromial hook. A 5.38mm barrel burr was used to create a flat anterior and lateral aspect of the acromion, converting it from a Type 2 to a Type 1 acromion. Care was made to keep the deltoid fascia intact.   I then turned my attention to the arthroscopic distal clavicle excision. I identified the acromioclavicular joint. Surrounding bursal tissue was debrided and the edges of the joint were identified. I used the 5.35mm barrel burr to remove the distal clavicle parallel to the edge of the acromion. I was able to fit two widths of the burr into the space between the distal clavicle and acromion, signifying that I had removed ~69mm of distal clavicle. This was confirmed by viewing anteriorly and introducing a probe with measuring marks from the lateral portal. Hemostasis was achieved with an Arthrocare wand.   Next, I created an accessory posterolateral portal to assist with visualization and instrumentation.  I debrided the poor quality edges of the supraspinatus tendon.  There is also significant calcific tendinitis about the rotator cuff tear.  This was also debrided using oscillating shaver and an arthroscopic biter until the rotator cuff edges were of better quality.  This was a tear at the musculotendinous junction at the level of the articular margin.  There was good-quality remnant tendon covering the footprint with mild retraction of the medial stump.  The medial stump could easily be mobilized to the footprint. I prepared the footprint using an ArthroCare wand to clear it of soft tissue.     I then percutaneously placed 1 Arthrex SwiveLock C anchor double loaded with suture  tape as a medial row anchor along the anterior portion of the tear at the articular margin. Another SwiveLock C anchor was placed along the posterior portion of the tear at the articular margin. I then shuttled all the strands of tape through the rotator cuff just  lateral to the musculotendinous junction using a FirstPass suture passer spanning the anterior to posterior extent of the tear.  The repair stitches from each anchor were passed in a side-to-side fashion.  First pass was used to pass through the medial stump and a BirdBeak was used to pass the other strand through the medial stump.  Next, the posterior strands of each suture were passed through an [employer] anchor.  This was placed approximately 2 cm distal to the lateral edge of the footprint in line with the posterior aspect of the tear with appropriate tensioning of each suture prior to final fixation.  Similarly, the anterior strands of each suture were passed through another SwiveLock anchor along the anterior margin of the tear.  The repair sutures from both SwiveLock anchors were then tied arthroscopically further reducing the tear.  This construct allowed for excellent reapproximation of the medial stump to the remnant lateral tendon at the level of the articular margin.  This construct allowed for excellent reduction of the rotator cuff, and appropriate compression was achieved.  The repair was stable to external and internal rotation.   Fluid was evacuated from the shoulder, and the portals were closed with 3-0 Nylon. Xeroform was applied to the portals. A sterile dressing was applied, followed by a Polar Care sleeve and a SlingShot shoulder immobilizer/sling. The patient was awakened from anesthesia without difficulty and was transferred to the PACU in stable condition.   Of note, assistance from a PA was essential to performing the surgery.  PA was present for the entire surgery.  PA assisted with patient positioning, retraction, instrumentation, and wound closure. The surgery would have been more difficult and had longer operative time without PA assistance.    Initially, there was significantly increased complexity to the surgery due to the pattern of the supraspinatus tear.  It was  located at the musculotendinous junction.  This necessitated multiple side-to-side stitches in addition to standard rotator cuff repair.  Additionally, subscapularis repair required use of additional implants and accessory anterolateral portal.  Both of these factors added approximately at least 45 minutes to the surgical time compared to standard arthroscopic rotator cuff repair.     COMPLICATIONS: none   DISPOSITION: plan for discharge home after recovery in PACU     POSTOPERATIVE PLAN: Remain in sling (except hygiene and elbow/wrist/hand RoM exercises as instructed by PT) x 6 weeks and NWB for this time. PT to begin 3-4 days after surgery.  Use large rotator cuff repair rehab protocol with subscapularis restrictions.  ASA 325mg  daily x 2 weeks for DVT ppx.

## 2024-04-04 ENCOUNTER — Encounter: Payer: Self-pay | Admitting: Orthopedic Surgery

## 2024-04-04 DIAGNOSIS — Z9889 Other specified postprocedural states: Secondary | ICD-10-CM | POA: Diagnosis not present

## 2024-04-04 DIAGNOSIS — M25511 Pain in right shoulder: Secondary | ICD-10-CM | POA: Diagnosis not present

## 2024-04-11 DIAGNOSIS — Z8679 Personal history of other diseases of the circulatory system: Secondary | ICD-10-CM | POA: Diagnosis not present

## 2024-04-11 DIAGNOSIS — M25511 Pain in right shoulder: Secondary | ICD-10-CM | POA: Diagnosis not present

## 2024-04-11 DIAGNOSIS — M25611 Stiffness of right shoulder, not elsewhere classified: Secondary | ICD-10-CM | POA: Diagnosis not present

## 2024-04-11 DIAGNOSIS — R000 Tachycardia, unspecified: Secondary | ICD-10-CM | POA: Diagnosis not present

## 2024-04-11 DIAGNOSIS — G8929 Other chronic pain: Secondary | ICD-10-CM | POA: Diagnosis not present

## 2024-04-11 DIAGNOSIS — M6281 Muscle weakness (generalized): Secondary | ICD-10-CM | POA: Diagnosis not present

## 2024-04-11 DIAGNOSIS — R002 Palpitations: Secondary | ICD-10-CM | POA: Diagnosis not present

## 2024-04-11 DIAGNOSIS — Z9889 Other specified postprocedural states: Secondary | ICD-10-CM | POA: Diagnosis not present

## 2024-04-18 DIAGNOSIS — Z9889 Other specified postprocedural states: Secondary | ICD-10-CM | POA: Diagnosis not present

## 2024-04-18 DIAGNOSIS — M6281 Muscle weakness (generalized): Secondary | ICD-10-CM | POA: Diagnosis not present

## 2024-04-25 DIAGNOSIS — M6281 Muscle weakness (generalized): Secondary | ICD-10-CM | POA: Diagnosis not present

## 2024-04-25 DIAGNOSIS — G8929 Other chronic pain: Secondary | ICD-10-CM | POA: Diagnosis not present

## 2024-04-25 DIAGNOSIS — Z9889 Other specified postprocedural states: Secondary | ICD-10-CM | POA: Diagnosis not present

## 2024-04-25 DIAGNOSIS — M25511 Pain in right shoulder: Secondary | ICD-10-CM | POA: Diagnosis not present

## 2024-04-25 DIAGNOSIS — M25611 Stiffness of right shoulder, not elsewhere classified: Secondary | ICD-10-CM | POA: Diagnosis not present

## 2024-05-04 DIAGNOSIS — G8929 Other chronic pain: Secondary | ICD-10-CM | POA: Diagnosis not present

## 2024-05-04 DIAGNOSIS — M6281 Muscle weakness (generalized): Secondary | ICD-10-CM | POA: Diagnosis not present

## 2024-05-04 DIAGNOSIS — M25611 Stiffness of right shoulder, not elsewhere classified: Secondary | ICD-10-CM | POA: Diagnosis not present

## 2024-05-04 DIAGNOSIS — M25511 Pain in right shoulder: Secondary | ICD-10-CM | POA: Diagnosis not present

## 2024-05-04 DIAGNOSIS — Z9889 Other specified postprocedural states: Secondary | ICD-10-CM | POA: Diagnosis not present

## 2024-05-09 DIAGNOSIS — M25511 Pain in right shoulder: Secondary | ICD-10-CM | POA: Diagnosis not present

## 2024-05-09 DIAGNOSIS — Z9889 Other specified postprocedural states: Secondary | ICD-10-CM | POA: Diagnosis not present

## 2024-05-10 DIAGNOSIS — Z1289 Encounter for screening for malignant neoplasm of other sites: Secondary | ICD-10-CM | POA: Diagnosis not present

## 2024-05-10 DIAGNOSIS — Z87891 Personal history of nicotine dependence: Secondary | ICD-10-CM | POA: Diagnosis not present

## 2024-05-10 DIAGNOSIS — K7460 Unspecified cirrhosis of liver: Secondary | ICD-10-CM | POA: Diagnosis not present

## 2024-05-10 DIAGNOSIS — R748 Abnormal levels of other serum enzymes: Secondary | ICD-10-CM | POA: Diagnosis not present

## 2024-05-16 DIAGNOSIS — Z9889 Other specified postprocedural states: Secondary | ICD-10-CM | POA: Diagnosis not present

## 2024-05-16 DIAGNOSIS — M25511 Pain in right shoulder: Secondary | ICD-10-CM | POA: Diagnosis not present

## 2024-05-23 DIAGNOSIS — M25611 Stiffness of right shoulder, not elsewhere classified: Secondary | ICD-10-CM | POA: Diagnosis not present

## 2024-05-23 DIAGNOSIS — G8929 Other chronic pain: Secondary | ICD-10-CM | POA: Diagnosis not present

## 2024-05-23 DIAGNOSIS — M6281 Muscle weakness (generalized): Secondary | ICD-10-CM | POA: Diagnosis not present

## 2024-05-23 DIAGNOSIS — Z9889 Other specified postprocedural states: Secondary | ICD-10-CM | POA: Diagnosis not present

## 2024-05-23 DIAGNOSIS — M25511 Pain in right shoulder: Secondary | ICD-10-CM | POA: Diagnosis not present

## 2024-05-30 DIAGNOSIS — Z9889 Other specified postprocedural states: Secondary | ICD-10-CM | POA: Diagnosis not present

## 2024-05-30 DIAGNOSIS — M6281 Muscle weakness (generalized): Secondary | ICD-10-CM | POA: Diagnosis not present

## 2024-05-30 DIAGNOSIS — G8929 Other chronic pain: Secondary | ICD-10-CM | POA: Diagnosis not present

## 2024-05-30 DIAGNOSIS — M25611 Stiffness of right shoulder, not elsewhere classified: Secondary | ICD-10-CM | POA: Diagnosis not present

## 2024-05-30 DIAGNOSIS — M25511 Pain in right shoulder: Secondary | ICD-10-CM | POA: Diagnosis not present

## 2024-06-06 DIAGNOSIS — M6281 Muscle weakness (generalized): Secondary | ICD-10-CM | POA: Diagnosis not present

## 2024-06-06 DIAGNOSIS — G8929 Other chronic pain: Secondary | ICD-10-CM | POA: Diagnosis not present

## 2024-06-06 DIAGNOSIS — Z9889 Other specified postprocedural states: Secondary | ICD-10-CM | POA: Diagnosis not present

## 2024-06-06 DIAGNOSIS — M25611 Stiffness of right shoulder, not elsewhere classified: Secondary | ICD-10-CM | POA: Diagnosis not present

## 2024-06-06 DIAGNOSIS — M25511 Pain in right shoulder: Secondary | ICD-10-CM | POA: Diagnosis not present

## 2024-06-13 DIAGNOSIS — Z9889 Other specified postprocedural states: Secondary | ICD-10-CM | POA: Diagnosis not present

## 2024-06-13 DIAGNOSIS — M25511 Pain in right shoulder: Secondary | ICD-10-CM | POA: Diagnosis not present

## 2024-06-20 DIAGNOSIS — M25611 Stiffness of right shoulder, not elsewhere classified: Secondary | ICD-10-CM | POA: Diagnosis not present

## 2024-06-20 DIAGNOSIS — M6281 Muscle weakness (generalized): Secondary | ICD-10-CM | POA: Diagnosis not present

## 2024-06-20 DIAGNOSIS — M25511 Pain in right shoulder: Secondary | ICD-10-CM | POA: Diagnosis not present

## 2024-06-20 DIAGNOSIS — Z9889 Other specified postprocedural states: Secondary | ICD-10-CM | POA: Diagnosis not present

## 2024-06-20 DIAGNOSIS — G8929 Other chronic pain: Secondary | ICD-10-CM | POA: Diagnosis not present

## 2024-06-27 DIAGNOSIS — M25511 Pain in right shoulder: Secondary | ICD-10-CM | POA: Diagnosis not present

## 2024-06-27 DIAGNOSIS — Z9889 Other specified postprocedural states: Secondary | ICD-10-CM | POA: Diagnosis not present

## 2024-06-29 DIAGNOSIS — D696 Thrombocytopenia, unspecified: Secondary | ICD-10-CM | POA: Diagnosis not present

## 2024-06-29 DIAGNOSIS — M5416 Radiculopathy, lumbar region: Secondary | ICD-10-CM | POA: Diagnosis not present

## 2024-06-29 DIAGNOSIS — M47816 Spondylosis without myelopathy or radiculopathy, lumbar region: Secondary | ICD-10-CM | POA: Diagnosis not present

## 2024-07-04 DIAGNOSIS — M25511 Pain in right shoulder: Secondary | ICD-10-CM | POA: Diagnosis not present

## 2024-07-04 DIAGNOSIS — M6281 Muscle weakness (generalized): Secondary | ICD-10-CM | POA: Diagnosis not present

## 2024-07-04 DIAGNOSIS — G8929 Other chronic pain: Secondary | ICD-10-CM | POA: Diagnosis not present

## 2024-07-04 DIAGNOSIS — M25611 Stiffness of right shoulder, not elsewhere classified: Secondary | ICD-10-CM | POA: Diagnosis not present

## 2024-07-04 DIAGNOSIS — Z9889 Other specified postprocedural states: Secondary | ICD-10-CM | POA: Diagnosis not present

## 2024-07-06 DIAGNOSIS — M75121 Complete rotator cuff tear or rupture of right shoulder, not specified as traumatic: Secondary | ICD-10-CM | POA: Diagnosis not present

## 2024-07-11 DIAGNOSIS — G8929 Other chronic pain: Secondary | ICD-10-CM | POA: Diagnosis not present

## 2024-07-11 DIAGNOSIS — M6281 Muscle weakness (generalized): Secondary | ICD-10-CM | POA: Diagnosis not present

## 2024-07-11 DIAGNOSIS — M25511 Pain in right shoulder: Secondary | ICD-10-CM | POA: Diagnosis not present

## 2024-07-11 DIAGNOSIS — Z9889 Other specified postprocedural states: Secondary | ICD-10-CM | POA: Diagnosis not present

## 2024-07-11 DIAGNOSIS — M25611 Stiffness of right shoulder, not elsewhere classified: Secondary | ICD-10-CM | POA: Diagnosis not present

## 2024-07-13 DIAGNOSIS — S93491A Sprain of other ligament of right ankle, initial encounter: Secondary | ICD-10-CM | POA: Diagnosis not present

## 2024-07-15 DIAGNOSIS — M47816 Spondylosis without myelopathy or radiculopathy, lumbar region: Secondary | ICD-10-CM | POA: Diagnosis not present

## 2024-07-15 DIAGNOSIS — D696 Thrombocytopenia, unspecified: Secondary | ICD-10-CM | POA: Diagnosis not present

## 2024-07-18 DIAGNOSIS — Z9889 Other specified postprocedural states: Secondary | ICD-10-CM | POA: Diagnosis not present

## 2024-07-18 DIAGNOSIS — M25611 Stiffness of right shoulder, not elsewhere classified: Secondary | ICD-10-CM | POA: Diagnosis not present

## 2024-08-22 DIAGNOSIS — Z79899 Other long term (current) drug therapy: Secondary | ICD-10-CM | POA: Diagnosis not present

## 2024-08-22 DIAGNOSIS — D696 Thrombocytopenia, unspecified: Secondary | ICD-10-CM | POA: Diagnosis not present

## 2024-08-26 DIAGNOSIS — D696 Thrombocytopenia, unspecified: Secondary | ICD-10-CM | POA: Diagnosis not present

## 2024-08-26 DIAGNOSIS — M1611 Unilateral primary osteoarthritis, right hip: Secondary | ICD-10-CM | POA: Diagnosis not present

## 2024-08-26 DIAGNOSIS — M47816 Spondylosis without myelopathy or radiculopathy, lumbar region: Secondary | ICD-10-CM | POA: Diagnosis not present

## 2024-08-30 DIAGNOSIS — Z0000 Encounter for general adult medical examination without abnormal findings: Secondary | ICD-10-CM | POA: Diagnosis not present

## 2024-08-30 DIAGNOSIS — Z87891 Personal history of nicotine dependence: Secondary | ICD-10-CM | POA: Diagnosis not present

## 2024-08-30 DIAGNOSIS — Z1331 Encounter for screening for depression: Secondary | ICD-10-CM | POA: Diagnosis not present

## 2024-08-30 DIAGNOSIS — R7301 Impaired fasting glucose: Secondary | ICD-10-CM | POA: Diagnosis not present

## 2024-08-30 DIAGNOSIS — I10 Essential (primary) hypertension: Secondary | ICD-10-CM | POA: Diagnosis not present

## 2024-08-30 DIAGNOSIS — D696 Thrombocytopenia, unspecified: Secondary | ICD-10-CM | POA: Diagnosis not present

## 2024-08-31 ENCOUNTER — Other Ambulatory Visit: Payer: Self-pay | Admitting: Family Medicine

## 2024-08-31 DIAGNOSIS — Z1231 Encounter for screening mammogram for malignant neoplasm of breast: Secondary | ICD-10-CM

## 2024-09-01 DIAGNOSIS — M1611 Unilateral primary osteoarthritis, right hip: Secondary | ICD-10-CM | POA: Diagnosis not present

## 2024-09-01 DIAGNOSIS — D696 Thrombocytopenia, unspecified: Secondary | ICD-10-CM | POA: Diagnosis not present

## 2024-09-08 DIAGNOSIS — N3946 Mixed incontinence: Secondary | ICD-10-CM | POA: Diagnosis not present

## 2024-09-20 DIAGNOSIS — R399 Unspecified symptoms and signs involving the genitourinary system: Secondary | ICD-10-CM | POA: Diagnosis not present

## 2024-09-23 ENCOUNTER — Ambulatory Visit
Admission: RE | Admit: 2024-09-23 | Discharge: 2024-09-23 | Disposition: A | Source: Ambulatory Visit | Attending: Family Medicine | Admitting: Family Medicine

## 2024-09-23 DIAGNOSIS — Z1231 Encounter for screening mammogram for malignant neoplasm of breast: Secondary | ICD-10-CM | POA: Insufficient documentation

## 2024-09-26 DIAGNOSIS — M1611 Unilateral primary osteoarthritis, right hip: Secondary | ICD-10-CM | POA: Diagnosis not present

## 2024-09-26 DIAGNOSIS — M47816 Spondylosis without myelopathy or radiculopathy, lumbar region: Secondary | ICD-10-CM | POA: Diagnosis not present

## 2024-09-28 ENCOUNTER — Inpatient Hospital Stay: Attending: Oncology

## 2024-09-28 DIAGNOSIS — D696 Thrombocytopenia, unspecified: Secondary | ICD-10-CM | POA: Diagnosis not present

## 2024-09-28 LAB — CBC WITH DIFFERENTIAL (CANCER CENTER ONLY)
Abs Immature Granulocytes: 0.02 K/uL (ref 0.00–0.07)
Basophils Absolute: 0 K/uL (ref 0.0–0.1)
Basophils Relative: 0 %
Eosinophils Absolute: 0.2 K/uL (ref 0.0–0.5)
Eosinophils Relative: 3 %
HCT: 37.7 % (ref 36.0–46.0)
Hemoglobin: 13.1 g/dL (ref 12.0–15.0)
Immature Granulocytes: 0 %
Lymphocytes Relative: 23 %
Lymphs Abs: 1.6 K/uL (ref 0.7–4.0)
MCH: 33.7 pg (ref 26.0–34.0)
MCHC: 34.7 g/dL (ref 30.0–36.0)
MCV: 96.9 fL (ref 80.0–100.0)
Monocytes Absolute: 0.5 K/uL (ref 0.1–1.0)
Monocytes Relative: 7 %
Neutro Abs: 4.5 K/uL (ref 1.7–7.7)
Neutrophils Relative %: 67 %
Platelet Count: 79 K/uL — ABNORMAL LOW (ref 150–400)
RBC: 3.89 MIL/uL (ref 3.87–5.11)
RDW: 13.3 % (ref 11.5–15.5)
WBC Count: 6.8 K/uL (ref 4.0–10.5)
nRBC: 0 % (ref 0.0–0.2)

## 2024-10-05 DIAGNOSIS — M75121 Complete rotator cuff tear or rupture of right shoulder, not specified as traumatic: Secondary | ICD-10-CM | POA: Diagnosis not present

## 2024-10-10 DIAGNOSIS — R399 Unspecified symptoms and signs involving the genitourinary system: Secondary | ICD-10-CM | POA: Diagnosis not present

## 2024-10-10 DIAGNOSIS — R002 Palpitations: Secondary | ICD-10-CM | POA: Diagnosis not present

## 2024-10-10 DIAGNOSIS — R000 Tachycardia, unspecified: Secondary | ICD-10-CM | POA: Diagnosis not present

## 2024-10-12 DIAGNOSIS — R040 Epistaxis: Secondary | ICD-10-CM | POA: Diagnosis not present

## 2024-10-12 DIAGNOSIS — D696 Thrombocytopenia, unspecified: Secondary | ICD-10-CM | POA: Diagnosis not present

## 2024-10-12 DIAGNOSIS — N390 Urinary tract infection, site not specified: Secondary | ICD-10-CM | POA: Diagnosis not present

## 2024-10-17 ENCOUNTER — Encounter: Payer: Self-pay | Admitting: Physician Assistant

## 2024-10-17 ENCOUNTER — Ambulatory Visit: Admitting: Physician Assistant

## 2024-10-17 VITALS — BP 132/78 | HR 70 | Ht 66.0 in | Wt 167.5 lb

## 2024-10-17 DIAGNOSIS — N390 Urinary tract infection, site not specified: Secondary | ICD-10-CM

## 2024-10-17 LAB — URINALYSIS, COMPLETE
Bilirubin, UA: NEGATIVE
Glucose, UA: NEGATIVE
Ketones, UA: NEGATIVE
Leukocytes,UA: NEGATIVE
Nitrite, UA: NEGATIVE
Protein,UA: NEGATIVE
RBC, UA: NEGATIVE
Specific Gravity, UA: <1.005 — ABNORMAL LOW (ref 1.005–1.030)
Urobilinogen, Ur: 0.2 mg/dL (ref 0.2–1.0)
pH, UA: 6 (ref 5.0–7.5)

## 2024-10-17 LAB — MICROSCOPIC EXAMINATION
Bacteria, UA: NONE SEEN
RBC, Urine: NONE SEEN /HPF (ref 0–2)

## 2024-10-17 MED ORDER — DOXYCYCLINE HYCLATE 100 MG PO CAPS: 100.0000 mg | ORAL_CAPSULE | Freq: Two times a day (BID) | ORAL | 0 refills | Status: AC

## 2024-10-17 MED ORDER — ESTRADIOL 0.01 % VA CREA: TOPICAL_CREAM | VAGINAL | 12 refills | Status: AC

## 2024-10-17 NOTE — Patient Instructions (Signed)
 Ultrasound scheduling: [phone]  Recurrent UTI Prevention Strategies  Stay well hydrated. Eat a diet rich in fruit and vegetables. Start taking an over-the-counter cranberry supplement, with or without d-mannose, for urinary tract health. Take this once or twice daily on an empty stomach, e.g. right before bed. Start taking an over-the-counter probiotic containing the bacterial species called Lactobacillus. Take this daily. Start vaginal estrogen cream. Apply a pea-sized amount around the opening of the urethra every day for 2 weeks, then three times weekly forever.

## 2024-10-17 NOTE — Progress Notes (Unsigned)
 10/17/2024 1:53 PM   Traci Lynn October 05, 1951 [id number]  CC: Chief Complaint  Patient presents with   Recurrent UTI   HPI: Traci Lynn is a 73 y.o. female with PMH A-fib, cirrhosis, and prediabetes who presents today as a new patient for evaluation of recurrent UTI.  She is accompanied today by her husband, Elsie.  Today she reports a 3-year history of recurrent UTI.  She typically gets about 2 infections per year, with symptoms including dysuria, chills, malodorous urine, and urgency.  Her most recent infection started about 3 weeks ago and she describes it as the worst and most persistent infection that she has had.  She has completed multiple courses of antibiotics including Macrobid, which she completed this morning.  Her PCP put her on solifenacin to help with her urgency last month.  UTIs are not associated with sex.  She describes chronic, mild stress incontinence and urgency at baseline.  This has been slightly worse than usual for the past 4 to 5 months.  She wears 3 pads daily and they can be saturated.  She is s/p hysterectomy and postmenopausal.  Urine culture history as follows: 10/10/2024: ESBL E. coli 09/20/2024: ESBL E. coli 03/23/2023: Insignificant growth  She had a CTAP with contrast on 03/22/2023, which showed mild bilateral pelviectasis without hydroureter.  No urolithiasis.  In-office UA and microscopy pan negative.  PVR 11 mL.  PMH: Past Medical History:  Diagnosis Date   Allergic genetic state    Aortic atherosclerosis    Aortic atherosclerosis    Arthritis of both hands    Asthma    seasonal   Barrett's esophagus    Chronic ITP (idiopathic thrombocytopenia) (HCC)    a.) saw hematology 04/2022 - workup negative   Cirrhosis (HCC)    Cirrhosis of liver not due to alcohol (HCC)    Diverticulosis    GERD (gastroesophageal reflux disease)    GERD (gastroesophageal reflux disease)    History of hepatitis A    History of hepatitis A     Hypertension    Iron overload    Lumbago    Palpitations    Paroxysmal atrial fibrillation (HCC)    Paroxysmal atrial tachycardia    a.) stress echo 09/04/2021: EF >55%; met MPHR without symptoms; normal.   Paroxysmal atrial tachycardia    Peripheral edema    Pneumonia    as a infant   Portal hypertension (HCC)    Pre-diabetes    Sensorineural hearing loss, bilateral    TMJ (temporomandibular joint disorder)    a.) s/p TMJ arthroplasty   Tubular adenoma of colon    Wears contact lenses    Left eye only    Surgical History: Past Surgical History:  Procedure Laterality Date   ABDOMINAL HYSTERECTOMY  1979   CARPAL TUNNEL RELEASE Left 1997   CATARACT EXTRACTION W/PHACO Right 04/15/2017   Procedure: CATARACT EXTRACTION PHACO AND INTRAOCULAR LENS PLACEMENT (IOC);  Surgeon: Dingeldein, Steven, MD;  Location: [facility] ORS;  Service: Ophthalmology;  Laterality: Right;  Lot# [id number] US : 01:02.8 AP%: 19.7 CDE: 25.43   CATARACT EXTRACTION W/PHACO Left 05/13/2017   Procedure: CATARACT EXTRACTION PHACO AND INTRAOCULAR LENS PLACEMENT (IOC);  Surgeon: Dingeldein, Steven, MD;  Location: [facility] ORS;  Service: Ophthalmology;  Laterality: Left;  US  1:07.6 AP% 22.9 CDE 31.29 Fluid Pack lot # [id number]   COLONOSCOPY WITH PROPOFOL  N/A 10/09/2017   Procedure: COLONOSCOPY WITH PROPOFOL ;  Surgeon: Gaylyn Gladis PENNER, MD;  Location: [facility] ENDOSCOPY;  Service: Endoscopy;  Laterality: N/A;   COLONOSCOPY WITH PROPOFOL  N/A 07/29/2022   Procedure: COLONOSCOPY WITH PROPOFOL ;  Surgeon: Maryruth Ole DASEN, MD;  Location: [facility] ENDOSCOPY;  Service: Endoscopy;  Laterality: N/A;   ESOPHAGOGASTRODUODENOSCOPY (EGD) WITH PROPOFOL  N/A 10/09/2017   Procedure: ESOPHAGOGASTRODUODENOSCOPY (EGD) WITH PROPOFOL ;  Surgeon: Gaylyn Gladis PENNER, MD;  Location[facility] ENDOSCOPY;  Service: Endoscopy;  Laterality: N/A;   ESOPHAGOGASTRODUODENOSCOPY (EGD) WITH PROPOFOL  N/A 12/22/2017   Procedure: ESOPHAGOGASTRODUODENOSCOPY (EGD) WITH  PROPOFOL ;  Surgeon: Gaylyn Gladis PENNER, MD;  Location: [facility] ENDOSCOPY;  Service: Endoscopy;  Laterality: N/A;   ESOPHAGOGASTRODUODENOSCOPY (EGD) WITH PROPOFOL  N/A 05/23/2020   Procedure: ESOPHAGOGASTRODUODENOSCOPY (EGD) WITH PROPOFOL ;  Surgeon: Toledo, Ladell POUR, MD;  Location: [facility] ENDOSCOPY;  Service: Gastroenterology;  Laterality: N/A;   EYE SURGERY     KNEE ARTHROSCOPY WITH MEDIAL MENISECTOMY Right 08/19/2019   Procedure: KNEE ARTHROSCOPY WITH PARTIAL MEDIAL MENISECTOMY,  PLICA EXCISION;  Surgeon: Tobie Priest, MD;  Location: [facility];  Service: Orthopedics;  Laterality: Right;   REFRACTIVE SURGERY     SHOULDER ARTHROSCOPY WITH OPEN ROTATOR CUFF REPAIR Left 09/24/2018   Procedure: SHOULDER ARTHROSCOPY WITH OPEN ROTATOR CUFF REPAIR;  Surgeon: Tobie Priest, MD;  Location: [facility] ORS;  Service: Orthopedics;  Laterality: Left;   SHOULDER ARTHROSCOPY WITH OPEN ROTATOR CUFF REPAIR Right 04/01/2024   Procedure: ARTHROSCOPY, SHOULDER WITH REPAIR, ROTATOR CUFF, OPEN;  Surgeon: Tobie Priest, MD;  Location: [facility];  Service: Orthopedics;  Laterality: Right;  Right shoulder arthroscopic vs mini-open rotator cuff repair, biceps tenodesis, distal clavicle excision, subacromial decompression   TMJ ARTHROPLASTY     TONSILLECTOMY AND ADENOIDECTOMY      Home Medications:  Allergies as of 10/17/2024       Reactions   Porcine (pork) Protein-containing Drug Products Anaphylaxis, Dermatitis, Swelling   Blister   Shellfish Protein-containing Drug Products Dermatitis, Swelling   Blister Rash/blisters    Blister   Penicillin V Potassium Hives, Rash, Other (See Comments)   Has patient had a PCN reaction causing immediate rash, facial/tongue/throat swelling, SOB or lightheadedness with hypotension: Yes Has patient had a PCN reaction causing severe rash involving mucus membranes or skin necrosis: Yes Has patient had a PCN reaction that required hospitalization: No Has patient had a PCN  reaction occurring within the last 10 years: No If all of the above answers are NO, then may proceed with Cephalosporin use.   Bee Venom Swelling   Shellfish Allergy Hives, Swelling        Medication List        Accurate as of October 17, 2024  1:53 PM. If you have any questions, ask your nurse or doctor.          STOP taking these medications    ondansetron  4 MG disintegrating tablet Commonly known as: ZOFRAN -ODT Stopped by: Adaysha Dubinsky   oxyCODONE  5 MG immediate release tablet Commonly known as: Roxicodone  Stopped by: Amaranta Mehl   PROBIOTIC DAILY PO Stopped by: Lucie Hones       TAKE these medications    acetaminophen  500 MG tablet Commonly known as: TYLENOL  Take 2 tablets (1,000 mg total) by mouth every 8 (eight) hours.   albuterol 108 (90 Base) MCG/ACT inhaler Commonly known as: VENTOLIN HFA Inhale 1 puff into the lungs every 6 (six) hours as needed for wheezing or shortness of breath.   baclofen 10 MG tablet Commonly known as: LIORESAL Take 10 mg by mouth 2 (two) times daily.   CALCIUM 600+D PO Take 1 tablet by mouth 2 (two) times  daily.   cetirizine 10 MG tablet Commonly known as: ZYRTEC Take 10 mg by mouth daily.   dextromethorphan-guaiFENesin 30-600 MG 12hr tablet Commonly known as: MUCINEX DM Take 1 tablet by mouth every 12 (twelve) hours.   diphenhydrAMINE 25 mg capsule Commonly known as: BENADRYL Take 25 mg by mouth. What changed: Another medication with the same name was removed. Continue taking this medication, and follow the directions you see here. Changed by: Sailor Hevia   EPINEPHrine  0.3 mg/0.3 mL Soaj injection Commonly known as: EPI-PEN Inject 0.3 mg into the muscle once.   erythromycin ophthalmic ointment Administer to both eyes Two (2) times a day. Apply over sutures on both eyes twice a day. Can use in the eye as needed for irritation   fluticasone  50 MCG/ACT nasal spray Commonly  known as: FLONASE  Place 1 spray into both nostrils daily as needed for allergies.   folic acid  1 MG tablet Commonly known as: FOLVITE  Take 1 mg by mouth daily.   metoprolol tartrate 25 MG tablet Commonly known as: LOPRESSOR Take 25 mg by mouth 2 (two) times daily.   Myrbetriq 25 MG Tb24 tablet Generic drug: mirabegron ER Take 25 mg by mouth daily.   naproxen 500 MG tablet Commonly known as: NAPROSYN Take 500 mg by mouth 2 (two) times daily.   nitrofurantoin (macrocrystal-monohydrate) 100 MG capsule Commonly known as: MACROBID Take 100 mg by mouth 2 (two) times daily. for 7 days   pantoprazole 40 MG tablet Commonly known as: PROTONIX Take 40 mg by mouth daily.   solifenacin 5 MG tablet Commonly known as: VESICARE Take 5 mg by mouth daily.   spironolactone 25 MG tablet Commonly known as: ALDACTONE Take 25 mg by mouth daily.   sulfamethoxazole-trimethoprim 800-160 MG tablet Commonly known as: BACTRIM DS Take 1 tablet by mouth 2 (two) times daily.   VITAMIN C PO Take 250 mg by mouth daily.   Vitamin D3 125 MCG (5000 UT) Caps Take 2,000 Units by mouth daily.   VITAMIN E PO Take 1 capsule by mouth daily.        Allergies:  Allergies  Allergen Reactions   Porcine (Pork) Protein-Containing Drug Products Anaphylaxis, Dermatitis and Swelling    Blister   Shellfish Protein-Containing Drug Products Dermatitis and Swelling    Blister  Rash/blisters    Blister   Penicillin V Potassium Hives, Rash and Other (See Comments)    Has patient had a PCN reaction causing immediate rash, facial/tongue/throat swelling, SOB or lightheadedness with hypotension: Yes Has patient had a PCN reaction causing severe rash involving mucus membranes or skin necrosis: Yes Has patient had a PCN reaction that required hospitalization: No Has patient had a PCN reaction occurring within the last 10 years: No If all of the above answers are NO, then may proceed with Cephalosporin use.     Bee Venom Swelling   Shellfish Allergy Hives and Swelling    Family History: Family History  Problem Relation Age of Onset   Kidney disease Father        Deceased, 45   Colon cancer Mother        Deceased 39   Healthy Son    Breast cancer Neg Hx     Social History:   reports that she quit smoking about 53 years ago. Her smoking use included cigarettes. She has never used smokeless tobacco. She reports that she does not drink alcohol and does not use drugs.  Physical Exam: There were no vitals taken for  this visit.  Constitutional:  Alert and oriented, no acute distress, nontoxic appearing HEENT: , AT Cardiovascular: No clubbing, cyanosis, or edema Respiratory: Normal respiratory effort, no increased work of breathing Skin: No rashes, bruises or suspicious lesions Neurologic: Grossly intact, no focal deficits, moving all 4 extremities Psychiatric: Normal mood and affect  Laboratory Data: Results for orders placed or performed in visit on 10/17/24  Microscopic Examination   Collection Time: 10/17/24  1:46 PM   Urine  Result Value Ref Range   WBC, UA 0-5 0 - 5 /hpf   RBC, Urine None seen 0 - 2 /hpf   Epithelial Cells (non renal) 0-10 0 - 10 /hpf   Bacteria, UA None seen None seen/Few  Urinalysis, Complete   Collection Time: 10/17/24  1:46 PM  Result Value Ref Range   Specific Gravity, UA <1.005 (L) 1.005 - 1.030   pH, UA 6.0 5.0 - 7.5   Color, UA Yellow Yellow   Appearance Ur Clear Clear   Leukocytes,UA Negative Negative   Protein,UA Negative Negative/Trace   Glucose, UA Negative Negative   Ketones, UA Negative Negative   RBC, UA Negative Negative   Bilirubin, UA Negative Negative   Urobilinogen, Ur 0.2 0.2 - 1.0 mg/dL   Nitrite, UA Negative Negative   Microscopic Examination Comment    Microscopic Examination See below:    Assessment & Plan:   1. Recurrent UTI (Primary) 3 years of recurrent UTIs, now with 3 weeks of recurrent versus persistent UTI.  She is  emptying appropriately.  UA is bland today, though she completed culture appropriate Macrobid this morning.  She remains significantly symptomatic, so we will send in a week of doxycycline  for increased tissue penetration.  No significant findings on cross-sectional imaging from last year, will get renal ultrasound in case there is been any interval change, though we discussed that I have low suspicion for this.  Will start vaginal estrogen cream for UTI prevention.  We discussed other prevention strategies including staying well-hydrated, maintaining a healthy diet, cranberry/d-mannose supplements, and probiotics. - Urinalysis, Complete - Bladder Scan (Post Void Residual) in office - US  RENAL; Future - estradiol (ESTRACE) 0.01 % CREA vaginal cream; Apply one pea-sized amount around the opening of the urethra daily for 2 weeks, then 3 times weekly moving forward.  Dispense: 42.5 g; Refill: 12 - doxycycline  (VIBRAMYCIN ) 100 MG capsule; Take 1 capsule (100 mg total) by mouth 2 (two) times daily for 7 days.  Dispense: 14 capsule; Refill: 0   Return in about 2 months (around 12/17/2024) for rUTI follow up with UA, RUS results.  Lucie Hones, PA-C  [facility] [place] [address] [place], [place] [address] [phone]

## 2024-10-28 DIAGNOSIS — J310 Chronic rhinitis: Secondary | ICD-10-CM | POA: Diagnosis not present

## 2024-10-28 DIAGNOSIS — K219 Gastro-esophageal reflux disease without esophagitis: Secondary | ICD-10-CM | POA: Diagnosis not present

## 2024-10-31 ENCOUNTER — Ambulatory Visit
Admission: RE | Admit: 2024-10-31 | Discharge: 2024-10-31 | Disposition: A | Discharge status: Home / Self Care | Source: Ambulatory Visit | Attending: Physician Assistant | Admitting: Physician Assistant

## 2024-10-31 DIAGNOSIS — N390 Urinary tract infection, site not specified: Secondary | ICD-10-CM | POA: Insufficient documentation

## 2024-10-31 DIAGNOSIS — N281 Cyst of kidney, acquired: Secondary | ICD-10-CM | POA: Diagnosis not present

## 2024-11-29 NOTE — Progress Notes (Signed)
 [facility] Return Patient Visit  History Traci Lynn is a 73 y.o. female who presents for an established patient office visit for evaluation and management of cirrhosis.   History of Present Illness The patient is a 73 year old individual with a known diagnosis of cirrhosis of the liver, established through noninvasive testing.   She continues to experience constipation and bloating. No new bleeding problems or need for blood transfusions post-surgery. She does not consume alcohol, which is important for maintaining her liver health.  She resides in [place].  Prior History: Initial Hepatology evaluation 04/2023 with Dr. Wyatt but aware of the presence of liver disease to about 2020.  She has been followed in the [facility].  Review of her chart shows that she has had abnormal liver tests at least dating back to 2020.  Her pattern has been elevated alkaline phosphatase with generally normal AST/ALT.   Component     Latest Ref Rng 03/17/2019 09/09/2019 09/17/2020 04/01/2021 02/03/2022  AST     8 - 39 U/L 31  33  33  40 (H)  31   ALT     5 - 38 U/L 21  26  26  25  24    Alkaline Phosphatase     34 - 104 U/L 141 (H)  125 (H)  147 (H)  196 (H)  186 (H)   Albumin     3.5 - 4.8 g/dL 4.5  4.3  4.3  4.5  4.2   Bilirubin, Total     0.3 - 1.2 mg/dL 0.7  0.5  0.6  0.7  0.6    Component     Latest Ref Rng 07/01/2022 12/09/2022 01/19/2023 01/29/2023 02/13/2023 03/12/2023  AST     8 - 39 U/L 27  29  27  26   33  79 (H)   ALT     5 - 38 U/L 16  15  13  14  19   33   Alkaline Phosphatase     34 - 104 U/L 176 (H)  187 (H)  202 (H)  195 (H)  265 (H)  202 (H)   Albumin     3.5 - 4.8 g/dL 4.3  4.2  4.0  4.1  3.8  4.0   Bilirubin, Total     0.3 - 1.2 mg/dL 0.6  0.6  0.8  0.8  1.0  0.8     Laboratory evaluation included elevated transferrin saturation but normal ferritin.  HFE analysis revealed no mutations for hemochromatosis.  Viral studies were negative.  She is immune to hepatitis A.  GGT was  elevated consistent with a cholestatic process.  Antinuclear antibody AMA and anti-smooth muscle antibody were negative.   Labs Alpha 1 antitrypsin 152, MM AMA negative  ANA negative ASMA negative Ceruloplasmin 23.1 Ferritin 148 GGT 249 Hep A antibody total positive Hep B core antibody total negative Hep B surface antibody negative Hep B surface antigen negative Hep C antibody negative HFE mutation: no mutations detected Immunoglobulin profile IgG 1086, IgM 57 Iron/TIBC 54%  An ultrasound with elastography was performed 07/15/2022 revealing steatosis, nodular contour, normal spleen and median liver stiffness score of 16.6 kPa.  Upper endoscopy was performed 03/27/2022 with no varices.  She underwent robotic assisted laparoscopic cholecystectomy on 03/02/2023.  Her surgeon met with her family after the procedure and commented that her liver disease looked irreversible.  They recommended hepatology evaluation.  She did well with the procedure with no edema, ascites, confusion or jaundice.  CT scan 03/2023 revealed nodular liver consistent with cirrhosis but no complications of portal hypertension.  12/16/2023 MRI/MRCP negative for biliary process.  Health maintenance Hep A: Patient does have documented immunity to Hepatitis A. Hep B: Patient does not have documented immunity to Hepatitis B.   Influenza:  Pneumococcal: HCC: MRI negative 12/16/2023 Varices: EGD no varices 03/27/2022 CRC: colonoscopy 07/29/2022 with 7 adenomatous polyps, repeat 3 years Bone density:    Patient Active Problem List   Diagnosis Date Noted   Mixed stress and urge urinary incontinence 09/08/2024   Cirrhosis of liver not due to alcohol (CMS/HHS-HCC) 02/20/2023   Gastroesophageal reflux disease without esophagitis - followed by [facility] 08/18/2022   Lumbar radiculopathy, chronic - followed by Dr. Avanell 06/26/2021   Thrombocytopenia (Plt 79 - 09/28/24) - followed by Dr. Melanee 09/24/2020   Medicare annual  wellness visit, subsequent 08/30/24 03/17/2019   Seasonal allergies 03/17/2019   Medicare annual wellness visit, initial 09/02/17 09/02/2017   Osteopenia of multiple sites (Dexa 02/10/22 - repeat 2 yrs) 10/07/2016   History of paroxysmal atrial tachycardia - followed by Dr. Ammon      Past Medical History:  Diagnosis Date   Allergic state    GERD (gastroesophageal reflux disease)    History of chicken pox    History of paroxysmal atrial tachycardia    Hypertension    Tubular adenoma    History of      Past Surgical History:  Procedure Laterality Date   COLONOSCOPY  10/09/2017   Diverticulosis/PHx CP 5 yr. MUS    EGD  10/09/2017   Gastritis/Gastric ulcer repeat 2 months [facility] (Scheduled for 12/22/2017)   EGD  12/22/2017   Barrett's Esophagus/Gastritis/Repeat 58yrs/MUS   R knee athroscpy, partial medial mensiectomy (Complex tear with both radial and horizontal componets at the posterior horn/body junction involving approximately 75% of the meniscus width, R knee arthroscopic partial synovectomy with medial  Right 08/19/2019   Dr. Tobie   EGD  05/23/2020   Barrett's Esophagus/Repeat 71yrs/TKT   COLONOSCOPY  03/27/2022   Tubular adenoma/PHx CP   EGD  03/27/2022   Normal EGD/Cirrhosis   EGD  03/27/2022   Cirrhosis/Repeat 3 years/CTL   [facility]on [facility]  07/29/2022   Tubular adenoma/PHx CP/Repeat 76yrs/CTL   ROBOT ASSISTED LAPAROSCOPIC CHOLECYSTECTOMY  02/09/2023   Dr Lucas Cintron   Rt arthroscopic rotator cuff repair, biceps tenodesis, extensive debridement of shoulder, subacromial decompression, distal clavicle excision Right 04/01/2024   Dr. Tobie   CATARACT EXTRACTION Bilateral    Done on 04/15/17 & 05/13/17   CHOLECYSTECTOMY     ENDOSCOPIC CARPAL TUNNEL RELEASE Left    HYSTERECTOMY     KNEE ARTHROSCOPY     TEMPOROMANDIBULAR JOINT ARTHROPLASTY     TONSILLECTOMY        Medications  Current Outpatient Medications:    acetaminophen  (TYLENOL ) 500 MG  tablet, Take 1,000 mg by mouth every 8 (eight) hours as needed, Disp: , Rfl:    albuterol MDI, PROVENTIL, VENTOLIN, PROAIR, HFA 90 mcg/actuation inhaler, Inhale 2 inhalations into the lungs every 4 (four) hours as needed for Wheezing, Disp: 3 each, Rfl: 1   ascorbic acid (VITAMIN C) 250 MG tablet, Take 250 mg by mouth once daily., Disp: , Rfl:    calcium carbonate-vitamin D3 (CALTRATE 600+D) 600 mg(1,500mg ) -400 unit tablet, Take 1 tablet by mouth 2 (two) times daily., Disp: , Rfl:    cholecalciferol (VITAMIN D3) 2,000 unit capsule, Take 2,000 Units by mouth once daily., Disp: , Rfl:  dextromethorphan-guaiFENesin (MUCINEX DM) 30-600 mg ER tablet, Take 1 tablet by mouth every 12 (twelve) hours, Disp: , Rfl:    diphenhydrAMINE (BENADRYL) 25 mg capsule, Take 25 mg by mouth at bedtime as needed for Itching, Disp: , Rfl:    EPINEPHrine  (EPIPEN ) 0.3 mg/0.3 mL auto-injector, Inject 0.3 mg into the muscle once as needed, Disp: , Rfl:    Lactobac no.41/Bifidobact no.7 (PROBIOTIC-10 ORAL), Take by mouth 2 (two) times daily, Disp: , Rfl:    metoprolol TARTrate (LOPRESSOR) 25 MG tablet, TAKE 1 TABLET TWICE DAILY, Disp: 180 tablet, Rfl: 3   pantoprazole (PROTONIX) 40 MG DR tablet, Take 40 mg by mouth once daily, Disp: , Rfl:    solifenacin (VESICARE) 5 MG tablet, Take 1 tablet (5 mg total) by mouth once daily, Disp: 90 tablet, Rfl: 0   vitamin E 400 UNIT capsule, Take 400 Units by mouth once daily, Disp: , Rfl:   Allergies Allergies  Allergen Reactions   Poractant Alfa Anaphylaxis   Pork/Porcine Containing Products Rash and Swelling    Blister   Shellfish Containing Products Rash and Swelling    Rash/blisters  Blister   Bee Venom Protein (Honey Bee) Swelling   Penicillin V Potassium Rash   Penicillins Rash    Rash/blisters Small bumps      Social History   Socioeconomic History   Marital status: Married  Tobacco Use   Smoking status: Former    Current packs/day: 0.00     Average packs/day: 1 pack/day for 2.0 years (2.0 ttl pk-yrs)    Types: Cigarettes    Start date: 12/08/1970    Quit date: 12/08/1972    Years since quitting: 52.0    Passive exposure: Past   Smokeless tobacco: Never  Vaping Use   Vaping status: Never Used  Substance and Sexual Activity   Alcohol use: Never   Drug use: No   Sexual activity: Yes    Partners: Male    Birth control/protection: None   Social Drivers of Health   Financial Resource Strain: Low Risk  (08/27/2024)   Overall Financial Resource Strain (CARDIA)    Difficulty of Paying Living Expenses: Not hard at all  Food Insecurity: No Food Insecurity (08/27/2024)   Hunger Vital Sign    Worried About Running Out of Food in the Last Year: Never true    Ran Out of Food in the Last Year: Never true  Transportation Needs: No Transportation Needs (08/27/2024)   PRAPARE - Administrator, Civil Service (Medical): No    Lack of Transportation (Non-Medical): No    Family History  Problem Relation Name Age of Onset   Colon cancer Mother Maryelizabeth Minerva    Heart failure Mother Maryelizabeth Minerva    Asthma Mother Maryelizabeth Minerva    Alzheimer's disease Mother Maryelizabeth Minerva    Diabetes type II Father Richmond Minerva    Kidney cancer Father Richmond Minerva    Colon cancer Father Richmond Minerva        Kidney cancer   Kidney disease Father Richmond Minerva    Liver disease Neg Hx     Liver cancer Neg Hx     Cirrhosis Neg Hx      Review of Systems Constitutional: no fatigue Eyes: no visual changes Ears, nose, throat: no sore throat, no hearing changes Respiratory: no SOB CV: no CP, syncope GI: see HPI GU: no urinary difficulties Integumentary: no rashes Hematologic: no bruising M/S: no arthritis Neuro: no headaches, weakness, paresthesias Behavior/psych: no depression  or anxiety   Physical examination Vitals:   11/29/24 0941  BP: (!) 140/69  Pulse: 96  Resp: 16  Temp: 36.6 C (97.8 F)      Ht:   Wt:77 kg (169 lb 12.8 oz) ADJ:Anib surface area is 1.86 meters squared. Body mass index is 29.15 kg/m.  General appearance: alert and cooperative Eyes: anicteric GI/Abdomen: soft, non-tender; bowel sounds normal; no masses, no organomegaly Extremities: no cyanosis or edema; bilateral palmar erythema Skin: no rashes or lesions Lymph nodes: cervical, supraclavicular, and axillary nodes normal. Neurologic: grossly normal, no asterixis  Review of records  See HPI   MELD 3.0: 10 at 11/29/2024 11:28 AM Calculated from: Serum Creatinine: 0.8 mg/dL (Using min of 1 mg/dL) at 87/76/7974 88:71 AM Serum Sodium: 138 mmol/L (Using max of 137 mmol/L) at 11/29/2024 11:28 AM Total Bilirubin: 1.3 mg/dL at 87/76/7974 88:71 AM Serum Albumin: 3.9 g/dL (Using max of 3.5 g/dL) at 87/76/7974 88:71 AM INR(ratio): 1.2 at 11/29/2024 11:28 AM Age at listing (hypothetical): 68 years Sex: Female at 11/29/2024 11:28 AM  Results for orders placed during the hospital encounter of 12/16/23  MRI ABDOMEN AND MRCP W WO CONTRAST W 3D  Addendum 01/14/2024 12:46 PM ADDENDUM:  Addendum to the procedure and technique portions as follows:  Procedure:  MRI Abdomen with and without contrast, with MRCP Technique: Precontrast and dynamic postcontrast MR imaging of the abdomen was performed using the Liver Protocol. IV contrast was administered to improve disease detection and further define anatomy.  Electronically Reviewed by:  Devin Cao, MD, [facility] Electronically Reviewed on:  01/14/2024 9:57 AM  I have reviewed the images and concur with the above findings.  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 IV contrast was administered to improve disease detection and further define anatomy.  Findings:  Liver:  Mild contour nodularity of the liver surface with hepatic fibrosis. Slight drop in signal intensity on the opposed phase imaging as can be seen in hepatic steatosis..  Focal hepatic lesions:  No focal hepatic lesions.  Complications of chronic liver disease:  splenomegaly. Hepatic vascular anatomy:  Accessory left hepatic artery arising off the left gastric. The hepatic and portal veins are patent. Biliary system:  No intrahepatic or extrahepatic biliary ductal dilatation. The gallbladder surgically absent.  Other organs, findings, etc.:  - Lower Thorax: No suspicious pulmonary abnormalities. No pleural or pericardial effusions.  - Spleen: Mild splenomegaly measuring up to 14.3 cm craniocaudally.  - Pancreas: Normal in appearance.  - Adrenal Glands: Normal in appearance.  - Kidneys: Symmetric in size and enhancement. No suspicious renal lesions. Left interpolar renal cyst. No hydronephrosis.  - Abdominal Vasculature: No abdominal aortic aneurysm.  - Gastrointestinal Tract: No abnormal dilation or wall thickening.  - Peritoneum/Mesentery/Retroperitoneum: No free fluid.  - Lymph Nodes: No retroperitoneal or mesenteric lymphadenopathy.  - Body Wall: Unremarkable.  - Musculoskeletal:  No aggressive appearing osseous lesions.  Impression: 1.  No suspicious hepatic lesions. 2.  Cirrhotic liver morphology with suggestion of hepatic steatosis. _______________  NOTE:  This report utilizes LI-RADS version 2018, available online at [email or web address].  Electronically Reviewed by:  Devin Cao, MD, [facility] Electronically Reviewed on:  12/16/2023 3:26 PM  I  have reviewed the images and concur with the above findings.  Electronically Signed by:  Chad Miller, MD,  [facility] Electronically Signed on:  12/16/2023 6:59 PM  Hospital Outpatient Visit on 11/29/2024  Component Date Value Ref Range Status   Auto Result 11/29/2024 Yes   Final   WBC (White Blood Cell Count) 11/29/2024 8.1  3.2 - 9.8 x109/L Final   Hemoglobin 11/29/2024 14.5  11.7 - 15.5 g/dL Final   Hematocrit 87/76/7974 41.0  35.0 - 45.0 % Final   Platelets 11/29/2024 83 (L)  150 - 450 x109/L Final   MCV (Mean Corpuscular Volume) 11/29/2024 93  80 - 98 fL Final   MCH (Mean Corpuscular Hemoglobin) 11/29/2024 33.0  26.5 - 34.0 pg Final   MCHC (Mean Corpuscular Hemoglobin * 11/29/2024 35.4  31.0 - 36.0 % Final   RBC (Red Blood Cell Count) 11/29/2024 4.40  3.77 - 5.16 x1012/L Final   RDW-CV (Red Cell Distribution Widt* 11/29/2024 13.3  11.5 - 14.5 % Final   NRBC (Nucleated Red Blood Cell Cou* 11/29/2024 0.00  0 x109/L Final   NRBC % (Nucleated Red Blood Cell %) 11/29/2024 0.0  % Final   MPV (Mean Platelet Volume) 11/29/2024 10.7  7.2 - 11.7 fL Final   Neutrophil Count 11/29/2024 5.3  2.0 - 8.6 x109/L Final   Neutrophil % 11/29/2024 65.6  37 - 80 % Final   Lymphocyte Count 11/29/2024 1.9  0.6 - 4.2 x109/L Final   Lymphocyte % 11/29/2024 23.6  10 - 50 % Final   Monocyte Count 11/29/2024 0.5  0 - 0.9 x109/L Final   Monocyte % 11/29/2024 5.7  0 - 12 % Final   Eosinophil Count 11/29/2024 0.35  0 - 0.70 x109/L Final   Eosinophil % 11/29/2024 4.3  0 - 7 % Final   Basophil Count 11/29/2024 0.05  0 - 0.20 x109/L Final   Basophil % 11/29/2024 0.6  0 - 2 % Final   Slide Review/Morphology 11/29/2024 Yes   Final   Immature Granulocyte Count 11/29/2024 0.02  <=0.06 x109/L Final   Immature Granulocyte % 11/29/2024 0.2  <=0.7 % Final   Immature Platelet Fraction 11/29/2024 4.2  1.6 - 8.5 % Final   Alpha Fetoprotein (AFP) 11/29/2024 6.1  <9.0 ng/mL Final   Sodium 11/29/2024 138  135 - 145 mmol/L Final   Potassium 11/29/2024 4.0  3.5 - 5.0 mmol/L Final    Chloride 11/29/2024 103  98 - 108 mmol/L Final   Carbon Dioxide (CO2) 11/29/2024 26  21 - 30 mmol/L Final   Urea Nitrogen (BUN) 11/29/2024 8  7 - 20 mg/dL Final   Creatinine 87/76/7974 0.8  0.4 - 1.0 mg/dL Final   Glucose 87/76/7974 110  70 - 140 mg/dL Final   Calcium 87/76/7974 10.1  8.7 - 10.2 mg/dL Final   AST (Aspartate Aminotransferase) 11/29/2024 31  15 - 41 U/L Final   ALT (Alanine Aminotransferase) 11/29/2024 18  10 - 39 U/L Final   Bilirubin, Total 11/29/2024 1.3  0.4 - 1.5 mg/dL Final   Alk Phos (Alkaline Phosphatase) 11/29/2024 144 (H)  24 - 110 U/L Final   Albumin 11/29/2024 3.9  3.5 - 4.8 g/dL Final   Protein, Total 11/29/2024 7.2  6.2 - 8.1 g/dL Final   Anion Gap 87/76/7974 9  3 - 12 mmol/L Final   BUN/CREA Ratio 11/29/2024 11  6 - 27 Final   Glomerular Filtration Rate (eGFR)  11/29/2024 78  mL/min/1.73sq m Final   Prothrombin Time 11/29/2024 14.0 (  H)  9.5 - 13.1 sec Final   Prothrombin INR 11/29/2024 1.2 (H)  0.9 - 1.1 Final  Procedure Visit-NonEstab Relationship on 11/29/2024  Component Date Value Ref Range Status   TOTAL NUMBER OF VALID READINGS 11/29/2024 10   Final   TOTAL NUMBER OF INVALID READINGS 11/29/2024 14   Final   CAP 11/29/2024 246  dB/m Final   MEDIAN SHEAR WAVE SPEED, VS(M/S) 11/29/2024 3.15  m/s Final   MEDIAN EQUIVALENT LIVER STIFFNESS * 11/29/2024 29.6 (H)  kPa Final   STIFFNESS IQR (INTERQUARTILE RANGE* 11/29/2024 3.9  kPa Final   STIFFNESS IQR/MEDIAN (%)  11/29/2024 13   Final     Assessment & Plan Cirrhosis of the liver  based on Fibroscan score over 14 kPa in 2023 and today at 29 kPa Etiology of cirrhosis not yet known. Differential diagnoses that have been ruled out include AMA pos primary biliary cholangitis, and autoimmune liver disease. MRI (MRCP) negative for large duct PSC.  Left are AMA negative PBC and atypical presentation of MASLD or AIH.  Additionally this could be sarcoid.  The only way at this point to define  further and etiology would be biopsy.  With the low-grade abnormality, unclear that biopsy would be of high yield.  The bigger issue is that today's FibroScan score is in a zone where she is at risk for portal hypertension complications.  That fits together with a low platelet count that would see in the past.  Today is a reassessment FibroScan.  We saw similar value earlier this year but she was fresh out from surgery and also could not position completely appropriately for the scan based upon the surgery on her right shoulder.  Today we have a value that fits with that prior surprise value so I think it is true and something we should use for her clinical care.  Recommend at this point we initiate carvedilol 6.25 mg twice daily if she can tolerate it.  We see she is on metoprolol.  She says this is being used for A-fib.  She has a follow-up with cardiology in May 2026.  I will message the provider.  If her metoprolol could be switched to carvedilol and still serve her cardiology interest, that would be a 2 for 1 when with a single medication.  If she cannot switch to carvedilol, will need to continue surveillance endoscopy.  HCC surveillance Recommend ultrasound every 6 months or CT or MRI once yearly.  ReSound negative today.  Repeat in 6 months.   Follow up: 6 months and as needed.  Telehealth acceptability: 1 - Telehealth acceptable   Attestation Statement:   I personally performed the service, non-incident to. (WP)   ELIZABETH MARLA AMEL, PA

## 2024-12-19 ENCOUNTER — Encounter: Payer: Self-pay | Admitting: Physician Assistant

## 2024-12-19 ENCOUNTER — Ambulatory Visit: Admitting: Physician Assistant

## 2024-12-19 ENCOUNTER — Other Ambulatory Visit: Payer: Self-pay | Admitting: Otolaryngology

## 2024-12-19 VITALS — BP 157/89 | HR 82 | Ht 66.0 in | Wt 165.0 lb

## 2024-12-19 DIAGNOSIS — Z8709 Personal history of other diseases of the respiratory system: Secondary | ICD-10-CM

## 2024-12-19 DIAGNOSIS — N390 Urinary tract infection, site not specified: Secondary | ICD-10-CM | POA: Diagnosis not present

## 2024-12-19 LAB — URINALYSIS, COMPLETE
Bilirubin, UA: NEGATIVE
Glucose, UA: NEGATIVE
Ketones, UA: NEGATIVE
Nitrite, UA: NEGATIVE
Protein,UA: NEGATIVE
RBC, UA: NEGATIVE
Specific Gravity, UA: 1.01 (ref 1.005–1.030)
Urobilinogen, Ur: 0.2 mg/dL (ref 0.2–1.0)
pH, UA: 6 (ref 5.0–7.5)

## 2024-12-19 LAB — MICROSCOPIC EXAMINATION: WBC, UA: >30 /HPF — AB (ref 0–5)

## 2024-12-19 NOTE — Patient Instructions (Signed)

## 2024-12-19 NOTE — Progress Notes (Signed)
 "  12/19/2024 2:55 PM   Traci Lynn [birth year] [id number]  CC: Chief Complaint  Patient presents with   Recurrent UTI   HPI: Traci Lynn is a 74 y.o. female with PMH A-fib, cirrhosis, prediabetes, and recurrent versus persistent ESBL E. coli UTI who presents today for follow-up on estrogen cream.   She saw me as a new patient on 10/17/2024 for evaluation of recurrent UTIs.  She had completed 3 rounds of antibiotics with no improvement.  I started her on doxycycline  x 7 days and we discussed UTI prevention strategies.  I also ordered a renal ultrasound.  Today she reports no change in her urinary symptoms on doxycycline .  She continues to experience dysuria, urgency, frequency, and nocturia.  She denies fevers or gross hematuria.  Renal ultrasound dated 10/31/2024 showed no hydronephrosis or shadowing stones.  In-office UA today positive for 1+ leukocytes; urine microscopy with >30 WBCs/HPF and moderate bacteria.  PMH: Past Medical History:  Diagnosis Date   Allergic genetic state    Aortic atherosclerosis    Aortic atherosclerosis    Arthritis of both hands    Asthma    seasonal   Barrett's esophagus    Chronic ITP (idiopathic thrombocytopenia) (HCC)    a.) saw hematology 04/2022 - workup negative   Cirrhosis (HCC)    Cirrhosis of liver not due to alcohol (HCC)    Diverticulosis    GERD (gastroesophageal reflux disease)    GERD (gastroesophageal reflux disease)    History of hepatitis A    History of hepatitis A    Hypertension    Iron overload    Lumbago    Palpitations    Paroxysmal atrial fibrillation (HCC)    Paroxysmal atrial tachycardia    a.) stress echo 09/04/2021: EF >55%; met MPHR without symptoms; normal.   Paroxysmal atrial tachycardia    Peripheral edema    Pneumonia    as a infant   Portal hypertension (HCC)    Pre-diabetes    Sensorineural hearing loss, bilateral    TMJ (temporomandibular joint disorder)    a.) s/p TMJ arthroplasty    Tubular adenoma of colon    Wears contact lenses    Left eye only    Surgical History: Past Surgical History:  Procedure Laterality Date   ABDOMINAL HYSTERECTOMY  1979   CARPAL TUNNEL RELEASE Left 1997   CATARACT EXTRACTION W/PHACO Right 04/15/2017   Procedure: CATARACT EXTRACTION PHACO AND INTRAOCULAR LENS PLACEMENT (IOC);  Surgeon: Dingeldein, Steven, MD;  Location: [facility];  Service: Ophthalmology;  Laterality: Right;  Lot# [id number] US : 01:02.8 AP%: 19.7 CDE: 25.43   CATARACT EXTRACTION W/PHACO Left 05/13/2017   Procedure: CATARACT EXTRACTION PHACO AND INTRAOCULAR LENS PLACEMENT (IOC);  Surgeon: Dingeldein, Steven, MD;  Location: [facility];  Service: Ophthalmology;  Laterality: Left;  US  1:07.6 AP% 22.9 CDE 31.29 Fluid Pack lot # [id number]   COLONOSCOPY WITH PROPOFOL  N/A 10/09/2017   Procedure: COLONOSCOPY WITH PROPOFOL ;  Surgeon: Gaylyn Gladis PENNER, MD;  Location: [facility];  Service: Endoscopy;  Laterality: N/A;   COLONOSCOPY WITH PROPOFOL  N/A 07/29/2022   Procedure: COLONOSCOPY WITH PROPOFOL ;  Surgeon: Maryruth Ole DASEN, MD;  Location: [facility];  Service: Endoscopy;  Laterality: N/A;   ESOPHAGOGASTRODUODENOSCOPY (EGD) WITH PROPOFOL  N/A 10/09/2017   Procedure: ESOPHAGOGASTRODUODENOSCOPY (EGD) WITH PROPOFOL ;  Surgeon: Gaylyn Gladis PENNER, MD;  Location: [facility] END[facility]COPY;  Service: Endoscopy;  Laterality: N/A;   ESOPHAGOGASTRODUODENOSCOPY (EGD) WITH PROPOFOL  N/A 12/22/2017   Procedure: ESOPHAGOGASTRODUODENOSCOPY (EGD) WITH PROPOFOL ;  Surgeon: Gaylyn,  Gladis PENNER, MD;  Location: [facility] ENDOSCOPY;  Service: Endoscopy;  Laterality: N/A;   ESOPHAGOGASTRODUODENOSCOPY (EGD) WITH PROPOFOL  N/A 05/23/2020   Procedure: ESOPHAGOGASTRODUODENOSCOPY (EGD) WITH PROPOFOL ;  Surgeon: Toledo, Ladell POUR, MD;  Location: [facility] ENDOSCOPY;  Service: Gastroenterology;  Laterality: N/A;   EYE SURGERY     KNEE ARTHROSCOPY WITH MEDIAL MENISECTOMY Right 08/19/2019   Procedure: KNEE ARTHROSCOPY WITH  PARTIAL MEDIAL MENISECTOMY,  PLICA EXCISION;  Surgeon: Tobie Priest, MD;  Location: [facility];  Service: Orthopedics;  Laterality: Right;   REFRACTIVE SURGERY     SHOULDER ARTHROSCOPY WITH OPEN ROTATOR CUFF REPAIR Left 09/24/2018   Procedure: SHOULDER ARTHROSCOPY WITH OPEN ROTATOR CUFF REPAIR;  Surgeon: Tobie Priest, MD;  Location: [facility];  Service: Orthopedics;  Laterality: Left;   SHOULDER ARTHROSCOPY WITH OPEN ROTATOR CUFF REPAIR Right 04/01/2024   Procedure: ARTHROSCOPY, SHOULDER WITH REPAIR, ROTATOR CUFF, OPEN;  Surgeon: Tobie Priest, MD;  Location: [facility];  Service: Orthopedics;  Laterality: Right;  Right shoulder arthroscopic vs mini-open rotator cuff repair, biceps tenodesis, distal clavicle excision, subacromial decompression   TMJ ARTHROPLASTY     TONSILLECTOMY AND ADENOIDECTOMY      Home Medications:  Allergies as of 12/19/2024       Reactions   Porcine (pork) Protein-containing Drug Products Anaphylaxis, Dermatitis, Swelling   Blister   Shellfish Protein-containing Drug Products Dermatitis, Swelling   Blister Rash/blisters    Blister   Penicillin V Potassium Hives, Rash, Other (See Comments)   Has patient had a PCN reaction causing immediate rash, facial/tongue/throat swelling, SOB or lightheadedness with hypotension: Yes Has patient had a PCN reaction causing severe rash involving mucus membranes or skin necrosis: Yes Has patient had a PCN reaction that required hospitalization: No Has patient had a PCN reaction occurring within the last 10 years: No If all of the above answers are NO, then may proceed with Cephalosporin use.   Bee Venom Swelling   Shellfish Allergy Hives, Swelling        Medication List        Accurate as of December 19, 2024  2:55 PM. If you have any questions, ask your nurse or doctor.          acetaminophen  500 MG tablet Commonly known as: TYLENOL  Take 2 tablets (1,000 mg total) by mouth every 8 (eight) hours.    albuterol 108 (90 Base) MCG/ACT inhaler Commonly known as: VENTOLIN HFA Inhale 1 puff into the lungs every 6 (six) hours as needed for wheezing or shortness of breath.   baclofen 10 MG tablet Commonly known as: LIORESAL Take 10 mg by mouth 2 (two) times daily.   CALCIUM 600+D PO Take 1 tablet by mouth 2 (two) times daily.   cetirizine 10 MG tablet Commonly known as: ZYRTEC Take 10 mg by mouth daily.   dextromethorphan-guaiFENesin 30-600 MG 12hr tablet Commonly known as: MUCINEX DM Take 1 tablet by mouth every 12 (twelve) hours.   diphenhydrAMINE 25 mg capsule Commonly known as: BENADRYL Take 25 mg by mouth.   EPINEPHrine  0.3 mg/0.3 mL Soaj injection Commonly known as: EPI-PEN Inject 0.3 mg into the muscle once.   erythromycin ophthalmic ointment Administer to both eyes Two (2) times a day. Apply over sutures on both eyes twice a day. Can use in the eye as needed for irritation   estradiol  0.01 % Crea vaginal cream Commonly known as: ESTRACE  Apply one pea-sized amount around the opening of the urethra daily for 2 weeks, then 3 times weekly moving forward.  fluticasone  50 MCG/ACT nasal spray Commonly known as: FLONASE  Place 1 spray into both nostrils daily as needed for allergies.   folic acid  1 MG tablet Commonly known as: FOLVITE  Take 1 mg by mouth daily.   metoprolol tartrate 25 MG tablet Commonly known as: LOPRESSOR Take 25 mg by mouth 2 (two) times daily.   Myrbetriq 25 MG Tb24 tablet Generic drug: mirabegron ER Take 25 mg by mouth daily.   naproxen 500 MG tablet Commonly known as: NAPROSYN Take 500 mg by mouth 2 (two) times daily.   pantoprazole 40 MG tablet Commonly known as: PROTONIX Take 40 mg by mouth daily.   solifenacin 5 MG tablet Commonly known as: VESICARE Take 5 mg by mouth daily.   spironolactone 25 MG tablet Commonly known as: ALDACTONE Take 25 mg by mouth daily.   VITAMIN C PO Take 250 mg by mouth daily.   Vitamin D3 125 MCG  (5000 UT) Caps Take 2,000 Units by mouth daily.   VITAMIN E PO Take 1 capsule by mouth daily.        Allergies:  Allergies[1]  Family History: Family History  Problem Relation Age of Onset   Kidney disease Father        Deceased, 77   Colon cancer Mother        Deceased 12   Healthy Son    Breast cancer Neg Hx     Social History:   reports that she quit smoking about 53 years ago. Her smoking use included cigarettes. She has never used smokeless tobacco. She reports that she does not drink alcohol and does not use drugs.  Physical Exam: BP (!) 157/89   Pulse 82   Ht 5' 6 (1.676 m)   Wt 165 lb (74.8 kg)   BMI 26.63 kg/m   Constitutional:  Alert and oriented, no acute distress, nontoxic appearing HEENT: [place], AT Cardiovascular: No clubbing, cyanosis, or edema Respiratory: Normal respiratory effort, no increased work of breathing Skin: No rashes, bruises or suspicious lesions Neurologic: Grossly intact, no focal deficits, moving all 4 extremities Psychiatric: Normal mood and affect  Laboratory Data: Results for orders placed or performed in visit on 12/19/24  Microscopic Examination   Collection Time: 12/19/24  2:21 PM   Urine  Result Value Ref Range   WBC, UA >30 (A) 0 - 5 /hpf   RBC, Urine 0-2 0 - 2 /hpf   Epithelial Cells (non renal) 0-10 0 - 10 /hpf   Bacteria, UA Moderate (A) None seen/Few  Urinalysis, Complete   Collection Time: 12/19/24  2:21 PM  Result Value Ref Range   Specific Gravity, UA 1.010 1.005 - 1.030   pH, UA 6.0 5.0 - 7.5   Color, UA Yellow Yellow   Appearance Ur Slightly cloudy Clear   Leukocytes,UA 1+ (A) Negative   Protein,UA Negative Negative/Trace   Glucose, UA Negative Negative   Ketones, UA Negative Negative   RBC, UA Negative Negative   Bilirubin, UA Negative Negative   Urobilinogen, Ur 0.2 0.2 - 1.0 mg/dL   Nitrite, UA Negative Negative   Microscopic Examination See below:    Pertinent Imaging: Results for orders placed  during the hospital encounter of 10/31/24  US  RENAL  Narrative CLINICAL DATA:  Recurrent urinary tract infections  EXAM: RENAL / URINARY TRACT ULTRASOUND COMPLETE  COMPARISON:  03/12/2023  FINDINGS: Right Kidney:  Renal measurements: 10.9 x 5.0 x 5.9 cm = volume: 170 mL. Echogenicity within normal limits. No mass or hydronephrosis visualized.  Left Kidney:  Renal measurements: 11.7 x 5.8 x 6.3 cm = volume: 224 mL. Echogenicity within normal limits. Simple anechoic midpole cortical cyst measures 2.1 cm. Extrarenal pelvis noted.  Bladder:  Appears normal for degree of bladder distention.  Other:  Ureteral jets demonstrated bilaterally.  IMPRESSION: 1. No acute finding by ultrasound. 2. 2.1 cm left renal cyst.   Electronically Signed By: CHRISTELLA.  Shick M.D. On: 11/04/2024 13:25  I personally reviewed the images referenced above and note no hydronephrosis or shadowing stones.  Assessment & Plan:   1. Recurrent UTI (Primary) 2 to 3 months of persistent UTI symptoms despite multiple courses of culture appropriate antibiotics for ESBL E. coli.  UA again positive today, will send for Vikor culture for antibiotic recommendations and treat as indicated.  No significant findings on ultrasound.  I did recommend cystoscopy for further evaluation. - Urinalysis, Complete - CULTURE, URINE COMPREHENSIVE   Return in about 2 weeks (around 01/02/2025) for Cystoscopy with Dr. Georganne.  Lucie Hones, PA-C  [facility] [place] [address], [address] [place], [place] [address] [phone]      [1]  Allergies Allergen Reactions   Porcine (Pork) Protein-Containing Drug Products Anaphylaxis, Dermatitis and Swelling    Blister   Shellfish Protein-Containing Drug Products Dermatitis and Swelling    Blister  Rash/blisters    Blister   Penicillin V Potassium Hives, Rash and Other (See Comments)    Has patient had a PCN reaction causing immediate rash,  facial/tongue/throat swelling, SOB or lightheadedness with hypotension: Yes Has patient had a PCN reaction causing severe rash involving mucus membranes or skin necrosis: Yes Has patient had a PCN reaction that required hospitalization: No Has patient had a PCN reaction occurring within the last 10 years: No If all of the above answers are NO, then may proceed with Cephalosporin use.    Bee Venom Swelling   Shellfish Allergy Hives and Swelling   "

## 2024-12-19 NOTE — Progress Notes (Signed)
 Vikor scientific PCR testing preformed on urine sample from today's visit and sent off via [employer]. Pt will be contacted with results.  Mathew Pinal, RN

## 2024-12-20 ENCOUNTER — Encounter: Payer: Self-pay | Admitting: Otolaryngology

## 2024-12-23 ENCOUNTER — Inpatient Hospital Stay: Admission: RE | Admit: 2024-12-23

## 2024-12-23 ENCOUNTER — Telehealth: Payer: Self-pay | Admitting: Physician Assistant

## 2024-12-23 DIAGNOSIS — Z8709 Personal history of other diseases of the respiratory system: Secondary | ICD-10-CM

## 2024-12-23 DIAGNOSIS — R895 Abnormal microbiological findings in specimens from other organs, systems and tissues: Secondary | ICD-10-CM

## 2024-12-23 MED ORDER — SULFAMETHOXAZOLE-TRIMETHOPRIM 800-160 MG PO TABS: 1.0000 | ORAL_TABLET | Freq: Two times a day (BID) | ORAL | 0 refills | Status: AC

## 2024-12-23 NOTE — Telephone Encounter (Signed)
 Per her Vikor culture results, please start her on Bactrim  DS twice daily x 10 days and have her keep plans for cystoscopy with Dr. Georganne.

## 2024-12-25 LAB — CULTURE, URINE COMPREHENSIVE

## 2025-01-12 NOTE — Progress Notes (Unsigned)
" ° °  01/19/2025 8:22 AM   Traci Lynn 1951-12-08 [id number]  Cystoscopy Procedure Note:  Indication:  Recurrent ESBL E.coli  - x3 treatment culture-directed Abx  - on topical Estrace , Myrbetriq, Vesicare  - RBUS - normal  Currently on Bactrim  DS x 10d  After informed consent and discussion of the procedure and its risks, Traci Lynn was positioned and prepped in the standard fashion. Cystoscopy was performed with a flexible cystoscope. The external vaginal anatomy, urethra, bladder neck and bladder mucosa were visualized in a systematic fashion. The ureteral orifices were noted in orthotopic location and orientation. There were no bladder mucosal lesions, stones, debris or anatomic variants noted.   Imaging: Recent RBUS reviewed - no GU abnormalities noted  Findings: ***  Assessment and Plan: - Start suppressive dose 100 Trimethroprim daily, continue Estrace   Traci Skye, MD 01/12/2025   "

## 2025-01-19 ENCOUNTER — Other Ambulatory Visit: Admitting: Urology

## 2025-03-29 ENCOUNTER — Ambulatory Visit: Admitting: Oncology

## 2025-03-29 ENCOUNTER — Other Ambulatory Visit
